# Patient Record
Sex: Male | Born: 1935 | Race: White | Hispanic: No | Smoking: Former smoker
Health system: Southern US, Community
[De-identification: ages and names within clinical notes are randomized; demographics above are authoritative.]

## PROBLEM LIST (undated history)

## (undated) DIAGNOSIS — E039 Hypothyroidism, unspecified: Secondary | ICD-10-CM

## (undated) DIAGNOSIS — C4491 Basal cell carcinoma of skin, unspecified: Secondary | ICD-10-CM

## (undated) DIAGNOSIS — D649 Anemia, unspecified: Secondary | ICD-10-CM

## (undated) DIAGNOSIS — M1990 Unspecified osteoarthritis, unspecified site: Secondary | ICD-10-CM

## (undated) DIAGNOSIS — F32A Depression, unspecified: Secondary | ICD-10-CM

## (undated) DIAGNOSIS — G8929 Other chronic pain: Secondary | ICD-10-CM

## (undated) DIAGNOSIS — S41119A Laceration without foreign body of unspecified upper arm, initial encounter: Secondary | ICD-10-CM

## (undated) DIAGNOSIS — N390 Urinary tract infection, site not specified: Secondary | ICD-10-CM

## (undated) DIAGNOSIS — Z960 Presence of urogenital implants: Secondary | ICD-10-CM

## (undated) DIAGNOSIS — Z9289 Personal history of other medical treatment: Secondary | ICD-10-CM

## (undated) DIAGNOSIS — Z978 Presence of other specified devices: Secondary | ICD-10-CM

## (undated) DIAGNOSIS — C61 Malignant neoplasm of prostate: Secondary | ICD-10-CM

## (undated) DIAGNOSIS — J189 Pneumonia, unspecified organism: Secondary | ICD-10-CM

## (undated) DIAGNOSIS — F329 Major depressive disorder, single episode, unspecified: Secondary | ICD-10-CM

## (undated) DIAGNOSIS — H9190 Unspecified hearing loss, unspecified ear: Secondary | ICD-10-CM

## (undated) DIAGNOSIS — M549 Dorsalgia, unspecified: Secondary | ICD-10-CM

---
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
## 2010-04-05 ENCOUNTER — Encounter
Admission: RE | Admit: 2010-04-05 | Discharge: 2010-04-05 | Payer: Self-pay | Source: Home / Self Care | Attending: Family Medicine | Admitting: Family Medicine

## 2010-04-13 ENCOUNTER — Encounter
Admission: RE | Admit: 2010-04-13 | Discharge: 2010-04-13 | Payer: Self-pay | Source: Home / Self Care | Attending: Family Medicine | Admitting: Family Medicine

## 2010-04-14 ENCOUNTER — Encounter
Admission: RE | Admit: 2010-04-14 | Discharge: 2010-04-14 | Payer: Self-pay | Source: Home / Self Care | Attending: Family Medicine | Admitting: Family Medicine

## 2010-05-04 ENCOUNTER — Encounter
Admission: RE | Admit: 2010-05-04 | Discharge: 2010-05-04 | Payer: Self-pay | Source: Home / Self Care | Attending: Orthopedic Surgery | Admitting: Orthopedic Surgery

## 2012-08-10 ENCOUNTER — Ambulatory Visit (INDEPENDENT_AMBULATORY_CARE_PROVIDER_SITE_OTHER): Payer: Medicare Other | Admitting: Podiatry

## 2012-08-10 VITALS — Ht 63.0 in | Wt 135.0 lb

## 2012-08-10 DIAGNOSIS — M25579 Pain in unspecified ankle and joints of unspecified foot: Secondary | ICD-10-CM

## 2012-08-10 DIAGNOSIS — B351 Tinea unguium: Secondary | ICD-10-CM

## 2012-08-10 NOTE — Progress Notes (Signed)
Subjective: 79 y.o. year old male patient presents complaining of painful nails. Patient requests toe nails, corns and calluses trimmed.   Review of Systems - General ROS: negative for - chills, fatigue, fever, night sweats, sleep disturbance, weight gain or weight loss  Objective: Dermatologic: Thick yellow deformed nails x 10.  Dry xerotic skin bilateral. Vascular: Pedal pulses are faintly palpable. Positive of trophic change. Orthopedic: Contracted lesser digits 1-5 with bunion deformity bilateral. Neurologic: All epicritic and tactile sensations grossly intact.  Assessment: Dystrophic mycotic nails x 10.  Treatment: All mycotic nails debrided.  Return in 3 months or as needed.

## 2012-11-09 ENCOUNTER — Ambulatory Visit (INDEPENDENT_AMBULATORY_CARE_PROVIDER_SITE_OTHER): Payer: Medicare Other | Admitting: Podiatry

## 2012-11-09 ENCOUNTER — Encounter: Payer: Self-pay | Admitting: Podiatry

## 2012-11-09 VITALS — Ht 63.0 in | Wt 135.0 lb

## 2012-11-09 DIAGNOSIS — M25579 Pain in unspecified ankle and joints of unspecified foot: Secondary | ICD-10-CM | POA: Insufficient documentation

## 2012-11-09 DIAGNOSIS — B351 Tinea unguium: Secondary | ICD-10-CM | POA: Insufficient documentation

## 2012-11-09 NOTE — Progress Notes (Signed)
Subjective: 79 y.o. year old male patient presents complaining of painful nails on both great toes and requests trimming.   Objective: Dermatologic: Thick yellow deformed nails x 10.  Vascular: Pedal pulses are all palpable. Orthopedic: Contracted lesser digits bilatereal. Neurologic: All epicritic and tactile sensations grossly intact.  Assessment: Dystrophic mycotic nails x 10. Painful great toes.  Treatment: All mycotic nails, corns, calluses debrided.  Return in 3 months or as needed.

## 2013-02-04 ENCOUNTER — Emergency Department (HOSPITAL_COMMUNITY): Payer: Medicare Other | Admitting: Anesthesiology

## 2013-02-04 ENCOUNTER — Observation Stay (HOSPITAL_COMMUNITY)
Admission: EM | Admit: 2013-02-04 | Discharge: 2013-02-05 | Disposition: A | Discharge status: Home / Self Care | Payer: Medicare Other | Attending: Orthopedic Surgery | Admitting: Orthopedic Surgery

## 2013-02-04 ENCOUNTER — Emergency Department (HOSPITAL_COMMUNITY): Payer: Medicare Other

## 2013-02-04 ENCOUNTER — Encounter (HOSPITAL_COMMUNITY): Payer: Medicare Other | Admitting: Anesthesiology

## 2013-02-04 ENCOUNTER — Encounter (HOSPITAL_COMMUNITY): Payer: Self-pay | Admitting: Emergency Medicine

## 2013-02-04 ENCOUNTER — Encounter (HOSPITAL_COMMUNITY): Admission: EM | Disposition: A | Discharge status: Home / Self Care | Payer: Self-pay | Source: Home / Self Care

## 2013-02-04 DIAGNOSIS — S41109A Unspecified open wound of unspecified upper arm, initial encounter: Principal | ICD-10-CM | POA: Insufficient documentation

## 2013-02-04 DIAGNOSIS — Y92009 Unspecified place in unspecified non-institutional (private) residence as the place of occurrence of the external cause: Secondary | ICD-10-CM | POA: Insufficient documentation

## 2013-02-04 DIAGNOSIS — S41119A Laceration without foreign body of unspecified upper arm, initial encounter: Secondary | ICD-10-CM

## 2013-02-04 DIAGNOSIS — S41112A Laceration without foreign body of left upper arm, initial encounter: Secondary | ICD-10-CM

## 2013-02-04 DIAGNOSIS — S53499A Other sprain of unspecified elbow, initial encounter: Secondary | ICD-10-CM | POA: Insufficient documentation

## 2013-02-04 DIAGNOSIS — R609 Edema, unspecified: Secondary | ICD-10-CM | POA: Insufficient documentation

## 2013-02-04 DIAGNOSIS — W010XXA Fall on same level from slipping, tripping and stumbling without subsequent striking against object, initial encounter: Secondary | ICD-10-CM | POA: Insufficient documentation

## 2013-02-04 DIAGNOSIS — Z79899 Other long term (current) drug therapy: Secondary | ICD-10-CM | POA: Insufficient documentation

## 2013-02-04 DIAGNOSIS — S51809A Unspecified open wound of unspecified forearm, initial encounter: Secondary | ICD-10-CM | POA: Insufficient documentation

## 2013-02-04 DIAGNOSIS — Y998 Other external cause status: Secondary | ICD-10-CM | POA: Insufficient documentation

## 2013-02-04 DIAGNOSIS — S51009A Unspecified open wound of unspecified elbow, initial encounter: Secondary | ICD-10-CM | POA: Insufficient documentation

## 2013-02-04 HISTORY — DX: Basal cell carcinoma of skin, unspecified: C44.91

## 2013-02-04 HISTORY — PX: I & D EXTREMITY: SHX5045

## 2013-02-04 HISTORY — DX: Laceration without foreign body of unspecified upper arm, initial encounter: S41.119A

## 2013-02-04 HISTORY — PX: INCISION AND DRAINAGE: SHX5863

## 2013-02-04 LAB — CBC WITH DIFFERENTIAL/PLATELET
Basophils Absolute: 0 10*3/uL (ref 0.0–0.1)
Basophils Relative: 0 % (ref 0–1)
HCT: 37.4 % — ABNORMAL LOW (ref 39.0–52.0)
Hemoglobin: 12.2 g/dL — ABNORMAL LOW (ref 13.0–17.0)
Lymphocytes Relative: 15 % (ref 12–46)
MCHC: 32.6 g/dL (ref 30.0–36.0)
Monocytes Absolute: 0.4 10*3/uL (ref 0.1–1.0)
Neutro Abs: 7 10*3/uL (ref 1.7–7.7)
Platelets: 205 10*3/uL (ref 150–400)

## 2013-02-04 LAB — URINALYSIS, ROUTINE W REFLEX MICROSCOPIC
Bilirubin Urine: NEGATIVE
Glucose, UA: NEGATIVE mg/dL
Leukocytes, UA: NEGATIVE
Nitrite: NEGATIVE
Specific Gravity, Urine: 1.019 (ref 1.005–1.030)
pH: 5.5 (ref 5.0–8.0)

## 2013-02-04 LAB — BASIC METABOLIC PANEL
BUN: 29 mg/dL — ABNORMAL HIGH (ref 6–23)
Calcium: 9.2 mg/dL (ref 8.4–10.5)
Chloride: 102 mEq/L (ref 96–112)
Creatinine, Ser: 1.08 mg/dL (ref 0.50–1.35)
GFR calc non Af Amer: 57 mL/min — ABNORMAL LOW (ref >90)
Glucose, Bld: 177 mg/dL — ABNORMAL HIGH (ref 70–99)
Sodium: 139 mEq/L (ref 135–145)

## 2013-02-04 LAB — PROTIME-INR
INR: 0.93 (ref 0.00–1.49)
Prothrombin Time: 12.3 seconds (ref 11.6–15.2)

## 2013-02-04 LAB — SAMPLE TO BLOOD BANK

## 2013-02-04 SURGERY — IRRIGATION AND DEBRIDEMENT EXTREMITY
Anesthesia: General | Site: Arm Lower | Laterality: Left | Wound class: Dirty or Infected

## 2013-02-04 MED ORDER — BACITRACIN ZINC 500 UNIT/GM EX OINT
TOPICAL_OINTMENT | CUTANEOUS | Status: AC
  Filled 2013-02-04: qty 15

## 2013-02-04 MED ORDER — SUCCINYLCHOLINE CHLORIDE 20 MG/ML IJ SOLN
INTRAMUSCULAR | Status: DC | PRN
  Administered 2013-02-04: 100 mg via INTRAVENOUS

## 2013-02-04 MED ORDER — LACTATED RINGERS IV SOLN
INTRAVENOUS | Status: DC | PRN
  Administered 2013-02-04: 15:00:00 via INTRAVENOUS

## 2013-02-04 MED ORDER — HYDROCODONE-ACETAMINOPHEN 5-325 MG PO TABS: 1.0000 | ORAL_TABLET | ORAL | Status: DC | PRN

## 2013-02-04 MED ORDER — LIDOCAINE HCL (CARDIAC) 20 MG/ML IV SOLN
INTRAVENOUS | Status: DC | PRN
  Administered 2013-02-04: 100 mg via INTRAVENOUS

## 2013-02-04 MED ORDER — PHENYLEPHRINE HCL 10 MG/ML IJ SOLN
10.0000 mg | INTRAVENOUS | Status: DC | PRN
  Administered 2013-02-04: 5 ug/min via INTRAVENOUS

## 2013-02-04 MED ORDER — ONDANSETRON HCL 4 MG/2ML IJ SOLN: 4.0000 mg | Freq: Once | INTRAMUSCULAR | Status: DC | PRN

## 2013-02-04 MED ORDER — ARTIFICIAL TEARS OP OINT
TOPICAL_OINTMENT | OPHTHALMIC | Status: DC | PRN
  Administered 2013-02-04: 1 via OPHTHALMIC

## 2013-02-04 MED ORDER — MORPHINE SULFATE 2 MG/ML IJ SOLN: 1.0000 mg | INTRAMUSCULAR | Status: DC | PRN

## 2013-02-04 MED ORDER — SENNA 8.6 MG PO TABS
1.0000 | ORAL_TABLET | Freq: Two times a day (BID) | ORAL | Status: DC
  Administered 2013-02-04 – 2013-02-05 (×2): 8.6 mg via ORAL
  Filled 2013-02-04 (×3): qty 1

## 2013-02-04 MED ORDER — CEFAZOLIN SODIUM-DEXTROSE 2-3 GM-% IV SOLR
2.0000 g | Freq: Once | INTRAVENOUS | Status: AC
  Administered 2013-02-04: 2 g via INTRAVENOUS

## 2013-02-04 MED ORDER — ONDANSETRON HCL 4 MG/2ML IJ SOLN: 4.0000 mg | Freq: Four times a day (QID) | INTRAMUSCULAR | Status: DC | PRN

## 2013-02-04 MED ORDER — CEFAZOLIN SODIUM 1-5 GM-% IV SOLN
1.0000 g | Freq: Four times a day (QID) | INTRAVENOUS | Status: AC
  Administered 2013-02-04 – 2013-02-05 (×3): 1 g via INTRAVENOUS
  Filled 2013-02-04 (×4): qty 50

## 2013-02-04 MED ORDER — PHENYLEPHRINE HCL 10 MG/ML IJ SOLN
INTRAMUSCULAR | Status: DC | PRN
  Administered 2013-02-04: 80 ug via INTRAVENOUS
  Administered 2013-02-04 (×3): 40 ug via INTRAVENOUS
  Administered 2013-02-04: 80 ug via INTRAVENOUS

## 2013-02-04 MED ORDER — OXYCODONE HCL 5 MG PO TABS: 5.0000 mg | ORAL_TABLET | Freq: Once | ORAL | Status: DC | PRN

## 2013-02-04 MED ORDER — TETANUS-DIPHTH-ACELL PERTUSSIS 5-2.5-18.5 LF-MCG/0.5 IM SUSP
0.5000 mL | Freq: Once | INTRAMUSCULAR | Status: AC
  Administered 2013-02-04: 0.5 mL via INTRAMUSCULAR
  Filled 2013-02-04: qty 0.5

## 2013-02-04 MED ORDER — OXYCODONE HCL 5 MG/5ML PO SOLN: 5.0000 mg | Freq: Once | ORAL | Status: DC | PRN

## 2013-02-04 MED ORDER — DOCUSATE SODIUM 100 MG PO CAPS
100.0000 mg | ORAL_CAPSULE | Freq: Two times a day (BID) | ORAL | Status: DC
  Administered 2013-02-04 – 2013-02-05 (×2): 100 mg via ORAL
  Filled 2013-02-04 (×3): qty 1

## 2013-02-04 MED ORDER — HYDROMORPHONE HCL PF 1 MG/ML IJ SOLN
0.2500 mg | INTRAMUSCULAR | Status: DC | PRN
Start: 2013-02-04 — End: 2013-02-04

## 2013-02-04 MED ORDER — ONDANSETRON HCL 4 MG PO TABS
4.0000 mg | ORAL_TABLET | Freq: Four times a day (QID) | ORAL | Status: DC | PRN
  Administered 2013-02-05: 4 mg via ORAL
  Filled 2013-02-04: qty 1

## 2013-02-04 MED ORDER — ONDANSETRON HCL 4 MG/2ML IJ SOLN
INTRAMUSCULAR | Status: DC | PRN
  Administered 2013-02-04: 4 mg via INTRAVENOUS

## 2013-02-04 MED ORDER — BACITRACIN ZINC 500 UNIT/GM EX OINT
TOPICAL_OINTMENT | CUTANEOUS | Status: DC | PRN
  Administered 2013-02-04: 1 via TOPICAL

## 2013-02-04 MED ORDER — SODIUM CHLORIDE 0.9 % IV SOLN
INTRAVENOUS | Status: DC
  Administered 2013-02-04 (×2): via INTRAVENOUS

## 2013-02-04 MED ORDER — FENTANYL CITRATE 0.05 MG/ML IJ SOLN
INTRAMUSCULAR | Status: DC | PRN
  Administered 2013-02-04 (×4): 50 ug via INTRAVENOUS

## 2013-02-04 MED ORDER — LACTATED RINGERS IV SOLN
Freq: Once | INTRAVENOUS | Status: AC
  Administered 2013-02-04: 15:00:00 via INTRAVENOUS

## 2013-02-04 MED ORDER — PROPOFOL 10 MG/ML IV BOLUS
INTRAVENOUS | Status: DC | PRN
  Administered 2013-02-04: 80 mg via INTRAVENOUS

## 2013-02-04 MED ORDER — SODIUM CHLORIDE 0.9 % IR SOLN
Status: DC | PRN
  Administered 2013-02-04: 3000 mL

## 2013-02-04 MED ORDER — MEPERIDINE HCL 25 MG/ML IJ SOLN: 6.2500 mg | INTRAMUSCULAR | Status: DC | PRN

## 2013-02-04 SURGICAL SUPPLY — 58 items
BANDAGE CONFORM 3  STR LF (GAUZE/BANDAGES/DRESSINGS) IMPLANT
BANDAGE ELASTIC 4 VELCRO ST LF (GAUZE/BANDAGES/DRESSINGS) ×4 IMPLANT
BANDAGE ESMARK 6X9 LF (GAUZE/BANDAGES/DRESSINGS) IMPLANT
BLADE SURG 10 STRL SS (BLADE) ×2 IMPLANT
BNDG COHESIVE 4X5 TAN STRL (GAUZE/BANDAGES/DRESSINGS) IMPLANT
BNDG COHESIVE 6X5 TAN STRL LF (GAUZE/BANDAGES/DRESSINGS) IMPLANT
BNDG ESMARK 6X9 LF (GAUZE/BANDAGES/DRESSINGS)
CHLORAPREP W/TINT 26ML (MISCELLANEOUS) IMPLANT
CLOTH BEACON ORANGE TIMEOUT ST (SAFETY) IMPLANT
COVER SURGICAL LIGHT HANDLE (MISCELLANEOUS) ×2 IMPLANT
CUFF TOURNIQUET SINGLE 18IN (TOURNIQUET CUFF) ×2 IMPLANT
CUFF TOURNIQUET SINGLE 34IN LL (TOURNIQUET CUFF) IMPLANT
CUFF TOURNIQUET SINGLE 44IN (TOURNIQUET CUFF) IMPLANT
DRAIN PENROSE 1/2X12 LTX STRL (WOUND CARE) IMPLANT
DRAPE U-SHAPE 47X51 STRL (DRAPES) ×2 IMPLANT
DRSG ADAPTIC 3X8 NADH LF (GAUZE/BANDAGES/DRESSINGS) ×4 IMPLANT
DRSG PAD ABDOMINAL 8X10 ST (GAUZE/BANDAGES/DRESSINGS) ×4 IMPLANT
ELECT REM PT RETURN 9FT ADLT (ELECTROSURGICAL) ×2
ELECTRODE REM PT RTRN 9FT ADLT (ELECTROSURGICAL) ×1 IMPLANT
FLUID NSS /IRRIG 3000 ML XXX (IV SOLUTION) ×2 IMPLANT
GLOVE BIO SURGEON STRL SZ8 (GLOVE) ×2 IMPLANT
GLOVE BIOGEL PI IND STRL 6.5 (GLOVE) ×1 IMPLANT
GLOVE BIOGEL PI IND STRL 8 (GLOVE) ×1 IMPLANT
GLOVE BIOGEL PI INDICATOR 6.5 (GLOVE) ×1
GLOVE BIOGEL PI INDICATOR 8 (GLOVE) ×1
GLOVE SURG SS PI 6.5 STRL IVOR (GLOVE) ×4 IMPLANT
GOWN PREVENTION PLUS XLARGE (GOWN DISPOSABLE) ×2 IMPLANT
GOWN STRL NON-REIN LRG LVL3 (GOWN DISPOSABLE) ×4 IMPLANT
KIT BASIN OR (CUSTOM PROCEDURE TRAY) ×2 IMPLANT
KIT ROOM TURNOVER OR (KITS) ×2 IMPLANT
MANIFOLD NEPTUNE II (INSTRUMENTS) ×2 IMPLANT
NS IRRIG 1000ML POUR BTL (IV SOLUTION) IMPLANT
PACK ORTHO EXTREMITY (CUSTOM PROCEDURE TRAY) ×2 IMPLANT
PAD ARMBOARD 7.5X6 YLW CONV (MISCELLANEOUS) ×4 IMPLANT
PAD CAST 4YDX4 CTTN HI CHSV (CAST SUPPLIES) ×2 IMPLANT
PADDING CAST ABS 4INX4YD NS (CAST SUPPLIES) ×1
PADDING CAST ABS COTTON 4X4 ST (CAST SUPPLIES) ×1 IMPLANT
PADDING CAST COTTON 4X4 STRL (CAST SUPPLIES) ×2
SCRUB BETADINE 4OZ XXX (MISCELLANEOUS) ×2 IMPLANT
SOAP 2 % CHG 4 OZ (WOUND CARE) IMPLANT
SOLUTION BETADINE 4OZ (MISCELLANEOUS) ×2 IMPLANT
SPLINT PLASTER CAST XFAST 5X30 (CAST SUPPLIES) ×1 IMPLANT
SPLINT PLASTER XFAST SET 5X30 (CAST SUPPLIES) ×1
SPONGE GAUZE 4X4 12PLY (GAUZE/BANDAGES/DRESSINGS) ×2 IMPLANT
STAPLER VISISTAT 35W (STAPLE) IMPLANT
SUCTION FRAZIER TIP 10 FR DISP (SUCTIONS) ×2 IMPLANT
SUT ETHILON 3 0 FSL (SUTURE) ×4 IMPLANT
SUT PROLENE 3 0 PS 2 (SUTURE) ×2 IMPLANT
SUT VIC AB 2-0 CT1 27 (SUTURE) ×1
SUT VIC AB 2-0 CT1 TAPERPNT 27 (SUTURE) ×1 IMPLANT
SUT VIC AB 3-0 PS2 18 (SUTURE)
SUT VIC AB 3-0 PS2 18XBRD (SUTURE) IMPLANT
TOWEL OR 17X24 6PK STRL BLUE (TOWEL DISPOSABLE) ×2 IMPLANT
TOWEL OR 17X26 10 PK STRL BLUE (TOWEL DISPOSABLE) ×2 IMPLANT
TUBE CONNECTING 12X1/4 (SUCTIONS) ×2 IMPLANT
TUBING CYSTO DISP (UROLOGICAL SUPPLIES) ×2 IMPLANT
WATER STERILE IRR 1000ML POUR (IV SOLUTION) IMPLANT
YANKAUER SUCT BULB TIP NO VENT (SUCTIONS) ×2 IMPLANT

## 2013-02-04 NOTE — ED Provider Notes (Signed)
CSN: [id number]     Arrival date & time 02/04/13  1044 History   First MD Initiated Contact with Patient 02/04/13 1047     Chief Complaint  Patient presents with  . Extremity Laceration   (Consider location/radiation/quality/duration/timing/severity/associated sxs/prior Treatment) HPI Comments: Anthony Nicholson is a 79 y.o. male who was walking to get the newspaper, when he fell, injuring his left arm. He was able to return him to his home and dressed the injury. He denies other injury. He came here by private vehicle for evaluation. There are no other known modifying factors.   The history is provided by the patient.    Past Medical History  Diagnosis Date  . Basal cell carcinoma   . Laceration of arm 02/04/2013   Past Surgical History  Procedure Laterality Date  . Incision and drainage Left 02/04/2013    left arm laceration    Dr Victorino Dike  .  sharpnel removal    1943     History reviewed. No pertinent family history. History  Substance Use Topics  . Smoking status: Never Smoker   . Smokeless tobacco: Never Used  . Alcohol Use: No    Review of Systems  All other systems reviewed and are negative.    Allergies  Review of patient's allergies indicates no known allergies.  Home Medications   No current outpatient prescriptions on file. BP 123/47  Pulse 78  Temp(Src) 98.2 F (36.8 C) (Oral)  Resp 18  Ht 5\' 8"  (1.727 m)  Wt 135 lb (61.236 kg)  BMI 20.53 kg/m2  SpO2 97% Physical Exam  Nursing note and vitals reviewed. Constitutional: He is oriented to person, place, and time. He appears well-developed.  Elderly, frail  HENT:  Head: Normocephalic and atraumatic.  Right Ear: External ear normal.  Left Ear: External ear normal.  Eyes: Conjunctivae and EOM are normal. Pupils are equal, round, and reactive to light.  Neck: Normal range of motion and phonation normal. Neck supple.  Cardiovascular: Normal rate, regular rhythm, normal heart sounds and intact distal  pulses.   Pulmonary/Chest: Effort normal and breath sounds normal. He exhibits no bony tenderness.  Abdominal: Soft. Normal appearance. There is no tenderness.  Musculoskeletal: Normal range of motion.  Left arm with large laceration extending from the medial upper arm into the proximal forearm. Within this wound, are visible deep tissue elements, including a large lacerated muscle bundle, likely biceps, tendons, nerves, and the vessels. Surprisingly, there is very little bleeding from the wound.  Neurological: He is alert and oriented to person, place, and time. No cranial nerve deficit or sensory deficit. He exhibits normal muscle tone. Coordination normal.  Skin: Skin is warm, dry and intact.  Psychiatric: He has a normal mood and affect. His behavior is normal. Judgment and thought content normal.    ED Course  Procedures (including critical care time)  Medications  0.9 %  sodium chloride infusion ( Intravenous New Bag/Given 02/04/13 1841)  ondansetron (ZOFRAN) tablet 4 mg (4 mg Oral Given 02/05/13 1439)    Or  ondansetron (ZOFRAN) injection 4 mg ( Intravenous See Alternative 02/05/13 1439)  HYDROcodone-acetaminophen (NORCO/VICODIN) 5-325 MG per tablet 1 tablet (not administered)  morphine 2 MG/ML injection 1 mg (not administered)  senna (SENOKOT) tablet 8.6 mg (8.6 mg Oral Given 02/05/13 1006)  docusate sodium (COLACE) capsule 100 mg (100 mg Oral Given 02/05/13 1005)  TDaP (BOOSTRIX) injection 0.5 mL (0.5 mLs Intramuscular Given 02/04/13 1134)  lactated ringers infusion ( Intravenous New Bag/Given 02/04/13 1517)  ceFAZolin (ANCEF) IVPB 2 g/50 mL premix (2 g Intravenous Given 02/04/13 1535)  ceFAZolin (ANCEF) IVPB 1 g/50 mL premix (1 g Intravenous Given 02/05/13 1006)    Patient Vitals for the past 24 hrs:  BP Temp Temp src Pulse Resp SpO2  02/05/13 1348 123/47 mmHg 98.2 F (36.8 C) Oral 78 18 97 %  02/05/13 0501 110/51 mmHg 98.5 F (36.9 C) Oral 72 18 98 %  02/05/13 0200 124/50  mmHg 97.4 F (36.3 C) Oral 82 18 98 %  02/04/13 2019 123/95 mmHg 98.1 F (36.7 C) Oral 75 18 99 %  02/04/13 1815 154/54 mmHg 97.6 F (36.4 C) - 73 18 100 %  02/04/13 1750 - 97.6 F (36.4 C) - - - -  02/04/13 1741 147/79 mmHg - - 86 18 100 %  02/04/13 1726 152/73 mmHg - - 75 16 98 %  02/04/13 1711 140/64 mmHg - - 72 13 97 %  02/04/13 1656 139/61 mmHg - - 73 14 96 %  02/04/13 1641 120/60 mmHg - - 71 15 100 %  02/04/13 1638 - 97.6 F (36.4 C) - - - -   1:05 PM-Consult complete with Dr. Victorino Dike. Patient case explained and discussed. Hegrees to admit patient for further evaluation and treatment. Call ended at 1330    Labs Review Labs Reviewed  CBC WITH DIFFERENTIAL - Abnormal; Notable for the following:    RBC 4.09 (*)    Hemoglobin 12.2 (*)    HCT 37.4 (*)    Neutrophils Relative % 78 (*)    All other components within normal limits  BASIC METABOLIC PANEL - Abnormal; Notable for the following:    Glucose, Bld 177 (*)    BUN 29 (*)    GFR calc non Af Amer 57 (*)    GFR calc Af Amer 66 (*)    All other components within normal limits  URINE CULTURE  URINALYSIS, ROUTINE W REFLEX MICROSCOPIC  PROTIME-INR  SAMPLE TO BLOOD BANK   Imaging Review Dg Forearm Left  02/04/2013   CLINICAL DATA:  Recent traumatic injury  EXAM: LEFT FOREARM - 2 VIEW  COMPARISON:  None  FINDINGS: No definitive acute bony abnormality is seen. Considerable soft tissue swelling and air within the soft tissues is noted related to the recent injury.  IMPRESSION: Soft tissue abnormality consistent with a recent injury. No acute bony fracture is seen.   Electronically Signed   By: Alcide Clever M.D.   On: 02/04/2013 12:17   Dg Humerus Left  02/04/2013   CLINICAL DATA:  Fall.  EXAM: LEFT HUMERUS - 2+ VIEW  COMPARISON:  Forearm films same date.  FINDINGS: Significant soft tissue injury lower humerus/forearm level. No humeral fracture.  IMPRESSION: Significant soft tissue injury lower humerus/forearm level. No humeral  fracture.   Electronically Signed   By: Bridgett Larsson M.D.   On: 02/04/2013 12:38    EKG Interpretation     Ventricular Rate:  77 PR Interval:  166 QRS Duration: 86 QT Interval:  389 QTC Calculation: 441 R Axis:   75 Text Interpretation:  Sinus arrhythmia Ventricular premature complex No old tracing to compare                MDM   1. Laceration of left arm with complication, initial encounter    Complex left arm laceration with significant muscle injury. The wound is likely contaminated. We will need to be assessed and treated by an orthopedist. This will likely require operative repair in  an operating room.  Nursing Notes Reviewed/ Care Coordinated, and agree without changes. Applicable Imaging Reviewed.  Interpretation of Laboratory Data incorporated into ED treatment  Plan: Admit    Flint Melter, MD 02/05/13 [phone]

## 2013-02-04 NOTE — H&P (Signed)
Anthony Nicholson is an 79 y.o. male.   Chief Complaint: right arm laceration HPI: 79 y/o male was in his usual state of good health until this morning at 10:30 when he fell trying to get out of the door to get the paper.  He says his arm got caught on the door and bled profusely.  He denies numbness, tingling or weakness in the left upper extremity.  He c/o some mild pain medially with attempted motion.  No h/o injury or surgery to the left upper extremity in the past.  He denies any recent change in his health.  He has no h/o heart attack, stroke or diabetes.  He takes no blood thinners.  He lives with his wife at home.  PMH:  Basal cell carcinoma, HOH, low back pain  PSH:  Carcinoma excisions, L spine steroid injections by Dr. Ethelene Hal  FH:  Father died at 45 and had a h/o stroke.  Mom died at 32 of "old age".  Social History:  reports that he has never smoked. He has never used smokeless tobacco. He reports that he does not drink alcohol or use illicit drugs.  Allergies: No Known Allergies  Results for orders placed during the hospital encounter of 02/04/13 (from the past 48 hour(s))  CBC WITH DIFFERENTIAL     Status: Abnormal   Collection Time    02/04/13 11:08 AM      Result Value Range   WBC 9.0  4.0 - 10.5 K/uL   RBC 4.09 (*) 4.22 - 5.81 MIL/uL   Hemoglobin 12.2 (*) 13.0 - 17.0 g/dL   HCT 09.8 (*) 11.9 - 14.7 %   MCV 91.4  78.0 - 100.0 fL   MCH 29.8  26.0 - 34.0 pg   MCHC 32.6  30.0 - 36.0 g/dL   RDW 82.9  56.2 - 13.0 %   Platelets 205  150 - 400 K/uL   Neutrophils Relative % 78 (*) 43 - 77 %   Neutro Abs 7.0  1.7 - 7.7 K/uL   Lymphocytes Relative 15  12 - 46 %   Lymphs Abs 1.3  0.7 - 4.0 K/uL   Monocytes Relative 5  3 - 12 %   Monocytes Absolute 0.4  0.1 - 1.0 K/uL   Eosinophils Relative 2  0 - 5 %   Eosinophils Absolute 0.2  0.0 - 0.7 K/uL   Basophils Relative 0  0 - 1 %   Basophils Absolute 0.0  0.0 - 0.1 K/uL  BASIC METABOLIC PANEL     Status: Abnormal   Collection Time     02/04/13 11:08 AM      Result Value Range   Sodium 139  135 - 145 mEq/L   Potassium 3.8  3.5 - 5.1 mEq/L   Chloride 102  96 - 112 mEq/L   CO2 26  19 - 32 mEq/L   Glucose, Bld 177 (*) 70 - 99 mg/dL   BUN 29 (*) 6 - 23 mg/dL   Creatinine, Ser 8.65  0.50 - 1.35 mg/dL   Calcium 9.2  8.4 - 78.4 mg/dL   GFR calc non Af Amer 57 (*) >90 mL/min   GFR calc Af Amer 66 (*) >90 mL/min   Comment: (NOTE)     The eGFR has been calculated using the CKD EPI equation.     This calculation has not been validated in all clinical situations.     eGFR's persistently <90 mL/min signify possible Chronic Kidney  Disease.  PROTIME-INR     Status: None   Collection Time    02/04/13 11:08 AM      Result Value Range   Prothrombin Time 12.3  11.6 - 15.2 seconds   INR 0.93  0.00 - 1.49  SAMPLE TO BLOOD BANK     Status: None   Collection Time    02/04/13 11:08 AM      Result Value Range   Blood Bank Specimen SAMPLE AVAILABLE FOR TESTING     Sample Expiration 02/05/2013    URINALYSIS, ROUTINE W REFLEX MICROSCOPIC     Status: None   Collection Time    02/04/13  2:04 PM      Result Value Range   Color, Urine YELLOW  YELLOW   APPearance CLEAR  CLEAR   Specific Gravity, Urine 1.019  1.005 - 1.030   pH 5.5  5.0 - 8.0   Glucose, UA NEGATIVE  NEGATIVE mg/dL   Hgb urine dipstick NEGATIVE  NEGATIVE   Bilirubin Urine NEGATIVE  NEGATIVE   Ketones, ur NEGATIVE  NEGATIVE mg/dL   Protein, ur NEGATIVE  NEGATIVE mg/dL   Urobilinogen, UA 0.2  0.0 - 1.0 mg/dL   Nitrite NEGATIVE  NEGATIVE   Leukocytes, UA NEGATIVE  NEGATIVE   Comment: MICROSCOPIC NOT DONE ON URINES WITH NEGATIVE PROTEIN, BLOOD, LEUKOCYTES, NITRITE, OR GLUCOSE <1000 mg/dL.   Dg Forearm Left  02/04/2013   CLINICAL DATA:  Recent traumatic injury  EXAM: LEFT FOREARM - 2 VIEW  COMPARISON:  None  FINDINGS: No definitive acute bony abnormality is seen. Considerable soft tissue swelling and air within the soft tissues is noted related to the recent  injury.  IMPRESSION: Soft tissue abnormality consistent with a recent injury. No acute bony fracture is seen.   Electronically Signed   By: Alcide Clever M.D.   On: 02/04/2013 12:17   Dg Humerus Left  02/04/2013   CLINICAL DATA:  Fall.  EXAM: LEFT HUMERUS - 2+ VIEW  COMPARISON:  Forearm films same date.  FINDINGS: Significant soft tissue injury lower humerus/forearm level. No humeral fracture.  IMPRESSION: Significant soft tissue injury lower humerus/forearm level. No humeral fracture.   Electronically Signed   By: Bridgett Larsson M.D.   On: 02/04/2013 12:38    ROS as above.  Blood pressure 143/84, pulse 93, temperature 97.5 F (36.4 C), temperature source Oral, resp. rate 18, height 5\' 8"  (1.727 m), weight 61.236 kg (135 lb), SpO2 100.00%. Physical Exam  wn wd elderly male in nad.  A and O x 4.  Mood and affect normal. EOMI.  Resp unlabored.  L arm with laceration approx 30 cm from anteromedial across the medial elbow to the medial forearm.  FCU muscle bell protruding from the wound and dusky looking.  Median nerve and brachial artery visible in the wound but intact.  No contamination.  Sens to LT intact in the median nerve dist.  Strength is 5/5 in the FDL and FDP to the index and long fingers.  5/5 at the FPL.  2+ radial and 1+ ulnar pulses.  No lymphadenopathy.  Somewhat weak in palmar flexion at the wrist and with ulnar deviation.  Assessment/Plan Left arm, elbow and forearm laceration - to OR for I and D and repair.  The risks and benefits of the alternative treatment options have been discussed in detail.  The patient wishes to proceed with surgery and specifically understands risks of bleeding, infection, nerve damage, blood clots, need for additional surgery, amputation and death.  Anthony Nicholson 02/04/2013, 2:36 PM

## 2013-02-04 NOTE — Transfer of Care (Signed)
Immediate Anesthesia Transfer of Care Note  Patient: Anthony Nicholson  Procedure(s) Performed: Procedure(s): IRRIGATION AND DEBRIDEMENT Left Arm Laceration with Repair as Necessary (Left)  Patient Location: PACU  Anesthesia Type:General  Level of Consciousness: awake, alert  and oriented  Airway & Oxygen Therapy: Patient Spontanous Breathing and Patient connected to nasal cannula oxygen  Post-op Assessment: Report given to PACU RN and Post -op Vital signs reviewed and stable  Post vital signs: Reviewed and stable  Complications: No apparent anesthesia complications

## 2013-02-04 NOTE — Anesthesia Preprocedure Evaluation (Addendum)
Anesthesia Evaluation  Patient identified by MRN, date of birth, ID band Patient awake    Reviewed: Allergy & Precautions, H&P , NPO status , Patient's Chart, lab work & pertinent test results  Airway Mallampati: I TM Distance: >3 FB Neck ROM: Full    Dental  (+) Teeth Intact and Dental Advisory Given   Pulmonary          Cardiovascular     Neuro/Psych    GI/Hepatic   Endo/Other    Renal/GU      Musculoskeletal   Abdominal   Peds  Hematology   Anesthesia Other Findings   Reproductive/Obstetrics                          Anesthesia Physical Anesthesia Plan  ASA: III  Anesthesia Plan: General   Post-op Pain Management:    Induction: Intravenous  Airway Management Planned: Oral ETT  Additional Equipment:   Intra-op Plan:   Post-operative Plan: Extubation in OR  Informed Consent: I have reviewed the patients History and Physical, chart, labs and discussed the procedure including the risks, benefits and alternatives for the proposed anesthesia with the patient or authorized representative who has indicated his/her understanding and acceptance.   Dental advisory given  Plan Discussed with: CRNA  Anesthesia Plan Comments:        Anesthesia Quick Evaluation

## 2013-02-04 NOTE — Brief Op Note (Signed)
02/04/2013  4:47 PM  PATIENT:  Anthony Nicholson  79 y.o. male  PRE-OPERATIVE DIAGNOSIS:  Left Arm Laceration  POST-OPERATIVE DIAGNOSIS: 1.  Left volar arm, elbow and forearm laceration 27 cm long      2.  Avulsion of left flexor carpi radialis      3.  Avulsion of left palmaris longus  Procedure(s): 1.  Irrigation and excisional debridement of volar arm, elbow and forearm laceration including skin, subcut tissue and muscle 2.  Complex closure of left volar arm, elbow and forearm laceration 27 cm long  SURGEON:  Toni Arthurs, MD  ASSISTANT: n/a  ANESTHESIA:   General  EBL:  minimal   TOURNIQUET:  N/a  COMPLICATIONS:  None apparent  DISPOSITION:  Extubated, awake and stable to recovery.  DICTATION ID:  [id number]

## 2013-02-04 NOTE — Anesthesia Procedure Notes (Signed)
Procedure Name: Intubation Date/Time: 02/04/2013 3:32 PM Performed by: Gayla Medicus Pre-anesthesia Checklist: Patient identified, Timeout performed, Emergency Drugs available, Suction available and Patient being monitored Patient Re-evaluated:Patient Re-evaluated prior to inductionOxygen Delivery Method: Circle system utilized Preoxygenation: Pre-oxygenation with 100% oxygen Intubation Type: IV induction, Rapid sequence and Cricoid Pressure applied Laryngoscope Size: Mac and 3 Grade View: Grade I Tube type: Oral Tube size: 7.0 mm Number of attempts: 1 Airway Equipment and Method: Stylet Placement Confirmation: ETT inserted through vocal cords under direct vision,  positive ETCO2 and breath sounds checked- equal and bilateral Secured at: 21 cm Tube secured with: Tape Dental Injury: Teeth and Oropharynx as per pre-operative assessment

## 2013-02-04 NOTE — Preoperative (Signed)
Beta Blockers   Reason not to administer Beta Blockers:Not Applicable 

## 2013-02-04 NOTE — Anesthesia Postprocedure Evaluation (Signed)
Anesthesia Post Note  Patient: Anthony Nicholson  Procedure(s) Performed: Procedure(s) (LRB): IRRIGATION AND DEBRIDEMENT Left Arm Laceration with Repair as Necessary (Left)  Anesthesia type: general  Patient location: PACU  Post pain: Pain level controlled  Post assessment: Patient's Cardiovascular Status Stable  Last Vitals:  Filed Vitals:   02/04/13 1638  BP:   Pulse:   Temp: 36.4 C  Resp:     Post vital signs: Reviewed and stable  Level of consciousness: sedated  Complications: No apparent anesthesia complications

## 2013-02-04 NOTE — ED Notes (Signed)
Called report to OR.

## 2013-02-04 NOTE — ED Notes (Signed)
He has been evaluated by by surgery.

## 2013-02-04 NOTE — ED Notes (Signed)
Pt states he was on his way outside this morning to get the paper and slipped and fell on concrete. Presents with large open laceration to left forearm. Bones visible.

## 2013-02-05 ENCOUNTER — Encounter (HOSPITAL_COMMUNITY): Payer: Self-pay | Admitting: General Practice

## 2013-02-05 LAB — URINE CULTURE
Colony Count: NO GROWTH
Culture: NO GROWTH

## 2013-02-05 NOTE — Op Note (Signed)
Anthony Nicholson, Anthony Nicholson              ACCOUNT NO.:  [id number]  MEDICAL RECORD NO.:  [id number]  LOCATION:  [id number]                        FACILITY:  [facility]  PHYSICIAN:  Toni Arthurs, MD        DATE OF BIRTH:  [birth year]  DATE OF PROCEDURE:  02/04/2013 DATE OF DISCHARGE:                              OPERATIVE REPORT   PREOPERATIVE DIAGNOSIS:  Left arm laceration.  POSTOPERATIVE DIAGNOSES: 1. Left volar arm, elbow, and forearm laceration 27 cm in length. 2. Avulsion of left flexor carpi radialis muscle. 3. Avulsion of left palmaris longus muscle.  PROCEDURE: 1. Irrigation and excisional debridement of left volar arm, elbow, and     forearm laceration including skin, subcutaneous tissue, and muscle. 2. Complex closure of left volar arm, elbow, and forearm laceration 27     cm in length.  SURGEON:  Toni Arthurs, MD  ANESTHESIA:  General.  ESTIMATED BLOOD LOSS:  Minimal.  TOURNIQUET TIME:  0.  COMPLICATIONS:  None apparent.  DISPOSITION:  Extubated, awake, and stable to recovery.  INDICATIONS FOR PROCEDURE:  The patient is a 79 year old, right-hand- dominant male, who was in his usual state of good health until this morning when he fell trying to get out of his door to get the newspaper. He noticed immediate bleeding at the left arm.  He came to the emergency department.  In the ER, he was noted to have a long complicated laceration of the left volar arm, elbow, and forearm with median nerve evident in the depth of the wound.  He presents now for operative treatment of this severe complicated laceration.  He and his daughter understand the risks and benefits, the alternative treatment options and elects surgical treatment.  They specifically understand risks of bleeding, infection, nerve damage, blood clots, need for additional surgery, amputation, and death.  DESCRIPTION OF PROCEDURE:  After preoperative consent was obtained and the correct operative site was identified,  patient was brought to the operating room and placed supine on the operating table.  General anesthesia was induced.  Preoperative antibiotics were administered. Surgical time-out was taken.  The upper extremity was examined.  There was a 27-cm laceration that originated at the anteromedial arm across the anterior aspect of the elbow at the level of the flexion crease and then continued on down to the radial surface of the volar forearm. Evident within the wound was ruptured muscle belly.  These were examined carefully and were noted to be the origin of flexor carpi radialis and the palmaris longus.  These were totally avulsed from their origin. They were necrotic-appearing throughout the bulk of the muscle belly down to the level in musculotendinous junction.  The median nerve was carefully examined, it was noted to be intact as was the brachial artery.  The laceration extended over the medial epicondyle.  The ulnar nerve was present within its cubital tunnel with no evidence of any injury.  The skin was examined and then excisionally debrided circumferentially removing all nonviable tissue.  This was done with scissors.  Circumferential examination and subcutaneous tissue was then performed with excisional debridement with scissors.  The necrotic muscle was all excised sharply with scissors as well.  The  wound was then carefully irrigated with 3 L normal saline.  Again circumferential examination of the wound was carried out from the level of skin down to the level of muscle.  There was no evidence of any bone exposed.  The medial ligamentous structures of the elbow were noted to be intact and the elbow was stable to varus and valgus stress in 0 and 30 degrees of flexion.  The skin incision was noted to have a flap proximally.  This was rotated back into place and secured with simple sutures of 3-0 nylon.  The forearm incision was then closed with retention sutures to relieve the  pressure along the skin edges.  Simple sutures of 3-0 nylon were then used to close the skin incision.  Sterile dressings were applied followed by compression wrap and a posterior splint with the elbow flexed approximately 60 degrees, and new interval on the forearm in neutral rotation.  The patient was then awakened from anesthesia and transported to recovery room in stable condition.  FOLLOWUP PLAN:  The patient will be observed overnight for pain control and for IV antibiotics.  He will also have a Physical Therapy consult for use of a rolling walker with a platform for the left upper extremity.     Toni Arthurs, MD     JH/MEDQ  D:  02/04/2013  T:  02/05/2013  Job:  [id number]

## 2013-02-05 NOTE — Progress Notes (Signed)
Aundria Mems to be D/C'd Home per MD order. Discussed with the patient and all questions fully answered.    Medication List         cholecalciferol 1000 UNITS tablet  Commonly known as:  VITAMIN D  Take 1,000 Units by mouth 2 (two) times daily.        VVS, Skin clean, dry and intact without evidence of skin break down, no evidence of skin tears noted.  IV catheter discontinued intact. Site without signs and symptoms of complications. Dressing and pressure applied.  An After Visit Summary was printed and given to the patient.  Patient escorted via WC, and D/C home via private auto.  Kai Levins  02/05/2013 5:57 PM

## 2013-02-05 NOTE — Progress Notes (Signed)
02/05/13 Spoke with patient about HHC, received permission to call his daughter. Called # listed for patient's daughter Kathi Simpers, she is stepdaughter and asked that I fax list of HHC agenices to her and she would call patient's daughter Yehuda Mao to discuss.Faxed HHC agency list to [place]. Spoke with patient's daughter Yehuda Mao in person and she chose [facility]. Family is planning to increase hours of personal caregivers to have assistance 24/7.Contacted Krishna Dancel at [facility] and set up HHPT and HHOT.  Left platform walker delivered to patient by [facility]. Jacquelynn Cree RN, BSN, [employer]

## 2013-02-05 NOTE — Evaluation (Signed)
Physical Therapy Evaluation Patient Details Name: Anthony Nicholson MRN: [id number] DOB: Dec 08, 1917 Today's Date: 02/05/2013 Time: 0865-7846 PT Time Calculation (min): 25 min  PT Assessment / Plan / Recommendation History of Present Illness  pt presents after fall at home resulting in L UE laceration ~27cm in length.    Clinical Impression  Pt unsteady with mobility and relates multiple falls at home.  Pt is not safe to be ambulating alone.  Pt states his wife and daughter are around and he has hired help 2 days per week.  Need to confirm that family and CG can provide 24hr A for pt, if not will need to consider higher level of care and possibly SNF.      PT Assessment  Patient needs continued PT services    Follow Up Recommendations  Home health PT;Supervision/Assistance - 24 hour    Does the patient have the potential to tolerate intense rehabilitation      Barriers to Discharge        Equipment Recommendations   (L platform for RW.  )    Recommendations for Other Services OT consult   Frequency Min 3X/week    Precautions / Restrictions Precautions Precautions: Fall Precaution Comments: pt reports multiple falls at home Restrictions Weight Bearing Restrictions: Yes LUE Weight Bearing: Weight bear through elbow only   Pertinent Vitals/Pain Indicates L arm is sore.      Mobility  Bed Mobility Bed Mobility: Supine to Sit;Sitting - Scoot to Edge of Bed Supine to Sit: 3: Mod assist;HOB elevated Sitting - Scoot to Edge of Bed: 3: Mod assist Details for Bed Mobility Assistance: pt needs A to bring trunk up to sitting and to bring hips towards EOB.   Transfers Transfers: Sit to Stand;Stand to Sit Sit to Stand: 4: Min assist;With upper extremity assist;From bed Stand to Sit: 4: Min assist;With upper extremity assist;To chair/3-in-1 Details for Transfer Assistance: cues for use of R UE only.  pt with posterior lean with sit to stand and uncontrolled descent to chair to sit.    Ambulation/Gait Ambulation/Gait Assistance: 4: Min assist Ambulation Distance (Feet): 160 Feet Assistive device: Left platform walker Ambulation/Gait Assistance Details: pt generally unsteady with occasional LOBs laterally requiring MinA to prevent fall or bumping into wall.  Cues for use of RW and safety, however pt often tips walker so that only 1 or 2 legs are touching floor 2/2 pt LOB laterally.  pt indicates feeling dizzy, however states he is always dizzy.  pt also scissors L LE in front of R.  When asking pt about this he states he always does this because this is how he learned to march.   Gait Pattern: Step-through pattern;Decreased stride length;Scissoring;Trunk flexed;Narrow base of support Stairs: No Wheelchair Mobility Wheelchair Mobility: No    Exercises     PT Diagnosis: Difficulty walking  PT Problem List: Decreased strength;Decreased activity tolerance;Decreased balance;Decreased mobility;Decreased knowledge of use of DME;Decreased safety awareness PT Treatment Interventions: DME instruction;Gait training;Stair training;Functional mobility training;Therapeutic activities;Therapeutic exercise;Balance training;Patient/family education     PT Goals(Current goals can be found in the care plan section) Acute Rehab PT Goals Patient Stated Goal: Home PT Goal Formulation: With patient Time For Goal Achievement: 02/19/13 Potential to Achieve Goals: Fair  Visit Information  Last PT Received On: 02/05/13 Assistance Needed: +1 History of Present Illness: pt presents after fall at home resulting in L UE laceration ~27cm in length.         Prior Functioning  Home Living Family/patient expects to  be discharged to:: Private residence Living Arrangements: Spouse/significant other;Children Available Help at Discharge: Family;Available 24 hours/day Type of Home: House Home Access: Stairs to enter [employer] of Steps: 1 Entrance Stairs-Rails: None Home Layout: One  level Home Equipment: Walker - 2 wheels;Bedside commode;Wheelchair - manual Prior Function Level of Independence: Needs assistance Gait / Transfers Assistance Needed: Uses RW for ambulation, but states "sometimes I fall and sometimes I don't".   ADL's / Homemaking Assistance Needed: Family do all cooking and have hired help for cleaning.   Communication Communication: No difficulties Dominant Hand: Right    Cognition  Cognition Arousal/Alertness: Awake/alert Behavior During Therapy: WFL for tasks assessed/performed Overall Cognitive Status: No family/caregiver present to determine baseline cognitive functioning    Extremity/Trunk Assessment Upper Extremity Assessment Upper Extremity Assessment: LUE deficits/detail LUE Deficits / Details: In splint immobilizing L elbow and wrist.   LUE: Unable to fully assess due to immobilization Lower Extremity Assessment Lower Extremity Assessment: Generalized weakness Cervical / Trunk Assessment Cervical / Trunk Assessment: Kyphotic   Balance Balance Balance Assessed: Yes Static Standing Balance Static Standing - Balance Support: Bilateral upper extremity supported Static Standing - Level of Assistance: 4: Min assist  End of Session PT - End of Session Equipment Utilized During Treatment: Gait belt Activity Tolerance: Patient tolerated treatment well Patient left: in chair;with call bell/phone within reach Nurse Communication: Mobility status  GP Functional Assessment Tool Used: Clinical Judgement Functional Limitation: Mobility: Walking and moving around Mobility: Walking and Moving Around Current Status (J1914): At least 20 percent but less than 40 percent impaired, limited or restricted Mobility: Walking and Moving Around Goal Status [phone]): At least 1 percent but less than 20 percent impaired, limited or restricted   Sunny Schlein, [place] [phone] 02/05/2013, 9:31 AM

## 2013-02-08 ENCOUNTER — Encounter: Payer: Self-pay | Admitting: Podiatry

## 2013-02-08 ENCOUNTER — Ambulatory Visit (INDEPENDENT_AMBULATORY_CARE_PROVIDER_SITE_OTHER): Payer: Medicare Other | Admitting: Podiatry

## 2013-02-08 VITALS — BP 144/106 | HR 75 | Ht 63.0 in | Wt 135.0 lb

## 2013-02-08 DIAGNOSIS — M25579 Pain in unspecified ankle and joints of unspecified foot: Secondary | ICD-10-CM

## 2013-02-08 DIAGNOSIS — B351 Tinea unguium: Secondary | ICD-10-CM

## 2013-02-08 NOTE — Patient Instructions (Signed)
Seen for hypertrophic nails. All nails debrided. Return as needed.  

## 2013-02-08 NOTE — Progress Notes (Signed)
Subjective:  79 y.o. year old male patient presents with aid of walker and arm rest, requesting toe nails trimmed. He fell and had broken arm on left, which is in cast now.   Objective: Dermatologic: Thick yellow deformed nails x 10.  Digital corn at distal end of 3rd digit left.  No open skin lesions.  Vascular: Pedal pulses are all palpable.  Orthopedic: Contracted lesser digits bilatereal.  Neurologic: All epicritic and tactile sensations grossly intact.   Assessment:  Dystrophic mycotic nails x 10.  Contracted digits with digital corn 3rd left. Painful toes.   Treatment: All mycotic nails and corns debrided.  Return in 3 months or as needed.

## 2013-02-13 NOTE — Discharge Summary (Signed)
Physician Discharge Summary  Patient ID: Anthony Nicholson MRN: [id number] DOB/AGE: 77-Jul-1919 72 y.o.  Admit date: 02/04/2013 Discharge date: 02/05/2013  Admission Diagnoses: Deep tissue lacerations left arm  Discharge Diagnoses: same Active Problems:   * No active hospital problems. *   Discharged Condition: good  Hospital Course: Pt admitted to hospital through ER for deep tissue lacerations of left arm following fall at home.  Pt was taken to OR by Dr Victorino Dike where I&D and repair of lacerations was performed without complication.  Pt was recovered in the PACU and then transferred to the floor for further post operative care. All vital signs were stable and pt was cleared for discharge.    Consults: None  Significant Diagnostic Studies: none  Treatments: antibiotics: Ancef  Discharge Exam:  wn wd elderly male in nad. A and O x 4. Mood and affect normal. EOMI. Resp unlabored.  Dressing C/D/I, fingers well perfused and mobile.  Upper extremities NVI. Radial pulse 2+ bilaterally.  Normal sensation to light touch.     Disposition: 01-Home or Self Care  Discharge Orders   Future Appointments Provider Department Dept Phone   05/13/2013 2:30 PM Myeong Eyvonne Left [facility] [phone]   Future Orders Complete By Expires   Call MD / Call 911  As directed    Comments:     If you experience chest pain or shortness of breath, CALL 911 and be transported to the hospital emergency room.  If you develope a fever above 101 F, pus (white drainage) or increased drainage or redness at the wound, or calf pain, call your surgeon's office.   Constipation Prevention  As directed    Comments:     Drink plenty of fluids.  Prune juice may be helpful.  You may use a stool softener, such as Colace (over the counter) 100 mg twice a day.  Use MiraLax (over the counter) for constipation as needed.   Diet - low sodium heart healthy  As directed    Driving restrictions  As directed    Comments:      No driving for 2 weeks   Increase activity slowly as tolerated  As directed    Lifting restrictions  As directed    Comments:     No lifting for 2 weeks       Medication List         cholecalciferol 1000 UNITS tablet  Commonly known as:  VITAMIN D  Take 1,000 Units by mouth 2 (two) times daily.           Follow-up Information   Follow up with Toni Arthurs, MD. Schedule an appointment as soon as possible for a visit on 02/11/2013.   Specialty:  Orthopedic Surgery   Contact information:   [address] [place] [address] [phone]       Signed: [place] Poli 02/13/2013, 7:25 AM

## 2013-02-14 NOTE — Discharge Summary (Signed)
Agree with above 

## 2013-02-21 ENCOUNTER — Other Ambulatory Visit: Payer: Self-pay

## 2013-05-10 ENCOUNTER — Ambulatory Visit (INDEPENDENT_AMBULATORY_CARE_PROVIDER_SITE_OTHER): Payer: Medicare Other | Admitting: Podiatry

## 2013-05-10 ENCOUNTER — Encounter: Payer: Self-pay | Admitting: Podiatry

## 2013-05-10 VITALS — BP 143/101 | HR 71 | Ht 63.0 in | Wt 136.0 lb

## 2013-05-10 DIAGNOSIS — M79609 Pain in unspecified limb: Secondary | ICD-10-CM

## 2013-05-10 DIAGNOSIS — B351 Tinea unguium: Secondary | ICD-10-CM

## 2013-05-10 DIAGNOSIS — M25579 Pain in unspecified ankle and joints of unspecified foot: Secondary | ICD-10-CM

## 2013-05-10 MED ORDER — TERBINAFINE HCL 1 % EX CREA: 1.0000 "application " | TOPICAL_CREAM | Freq: Two times a day (BID) | CUTANEOUS | Status: DC

## 2013-05-10 NOTE — Progress Notes (Signed)
Subjective:  80 y.o. year old male patient presents requesting toe nails trimmed. No other complaints today.   Patient requests antifungal cream for redness in skin.   Objective: Dermatologic: Thick yellow deformed nails x 10.  No open skin lesions. Dry scaly skin. Vascular: Pedal pulses are all palpable.  Orthopedic: Contracted lesser digits bilatereal.  Neurologic: All epicritic and tactile sensations grossly intact.   Assessment:  Dystrophic mycotic nails x 10.  Painful toes.   Treatment: All mycotic nails and corns debrided.  Return in 3 months or as needed.

## 2013-05-10 NOTE — Patient Instructions (Signed)
Seen for hypertrophic nails. All nails debrided. Return in 3 months or as needed.  

## 2013-05-13 ENCOUNTER — Ambulatory Visit: Payer: Medicare Other | Admitting: Podiatry

## 2013-08-09 ENCOUNTER — Ambulatory Visit (INDEPENDENT_AMBULATORY_CARE_PROVIDER_SITE_OTHER): Payer: Medicare Other | Admitting: Podiatry

## 2013-08-09 ENCOUNTER — Encounter: Payer: Self-pay | Admitting: Podiatry

## 2013-08-09 VITALS — BP 134/72 | HR 81 | Ht 63.0 in | Wt 135.0 lb

## 2013-08-09 DIAGNOSIS — M79609 Pain in unspecified limb: Secondary | ICD-10-CM

## 2013-08-09 DIAGNOSIS — M79606 Pain in leg, unspecified: Secondary | ICD-10-CM | POA: Insufficient documentation

## 2013-08-09 DIAGNOSIS — B351 Tinea unguium: Secondary | ICD-10-CM

## 2013-08-09 NOTE — Progress Notes (Signed)
Subjective:  80 y.o. year old male patient presents aided by walker requesting toe nails trimmed. No other complaints today.   Objective: Dermatologic: Thick yellow deformed nails x 10.  No open skin lesions. Dry scaly skin.  Vascular: Pedal pulses are all palpable.  Orthopedic: Contracted lesser digits bilatereal.  Neurologic: All epicritic and tactile sensations grossly intact.   Assessment:  Dystrophic mycotic nails x 10.  Painful toes.   Treatment: All mycotic nails and corns debrided.  Return in 3 months or as needed.

## 2013-08-09 NOTE — Patient Instructions (Signed)
Seen for hypertrophic nails. All nails debrided. Return in 3 months or as needed.  

## 2013-11-15 ENCOUNTER — Encounter: Payer: Self-pay | Admitting: Podiatry

## 2013-11-15 ENCOUNTER — Ambulatory Visit (INDEPENDENT_AMBULATORY_CARE_PROVIDER_SITE_OTHER): Payer: Medicare Other | Admitting: Podiatry

## 2013-11-15 VITALS — BP 150/90 | HR 83

## 2013-11-15 DIAGNOSIS — B351 Tinea unguium: Secondary | ICD-10-CM

## 2013-11-15 DIAGNOSIS — M79606 Pain in leg, unspecified: Secondary | ICD-10-CM

## 2013-11-15 DIAGNOSIS — M79609 Pain in unspecified limb: Secondary | ICD-10-CM

## 2013-11-15 NOTE — Patient Instructions (Signed)
Seen for hypertrophic nails. All nails debrided. Return in 3 months or as needed.  

## 2013-11-15 NOTE — Progress Notes (Signed)
Subjective:  80 y.o. year old male patient presents accompanied by his daughter requesting toe nails trimmed.  His wife passed away few months ago and is scheduled to move in assisted living tomorrow.  No other complaints today.   Objective: Dermatologic: Thick yellow deformed nails x 10.  No open skin lesions. Dry scaly skin.  Vascular: Pedal pulses are all palpable.  Orthopedic: Contracted lesser digits bilatereal.  Neurologic: All epicritic and tactile sensations grossly intact.   Assessment:  Dystrophic mycotic nails x 10.  Painful toes.   Treatment: All mycotic nails and corns debrided.  Return in 3 months or as needed.

## 2014-02-14 ENCOUNTER — Ambulatory Visit: Payer: Medicare Other | Admitting: Podiatry

## 2014-02-25 ENCOUNTER — Encounter: Payer: Self-pay | Admitting: Podiatry

## 2014-02-25 ENCOUNTER — Ambulatory Visit (INDEPENDENT_AMBULATORY_CARE_PROVIDER_SITE_OTHER): Payer: Medicare Other | Admitting: Podiatry

## 2014-02-25 VITALS — BP 150/71 | HR 68

## 2014-02-25 DIAGNOSIS — B351 Tinea unguium: Secondary | ICD-10-CM

## 2014-02-25 DIAGNOSIS — M79606 Pain in leg, unspecified: Secondary | ICD-10-CM

## 2014-02-25 NOTE — Progress Notes (Signed)
Subjective:  80 y.o. year old male patient presents requesting toe nails trimmed.  No other complaints today.   Objective: Dermatologic: Thick yellow deformed nails x 10.  No open skin lesions. Dry scaly skin.  Vascular: Pedal pulses are all palpable.  Orthopedic: Contracted lesser digits bilatereal.  Neurologic: All epicritic and tactile sensations grossly intact.   Assessment:  Dystrophic mycotic nails x 10.  Painful toes.   Treatment: All mycotic nails and corns debrided.  Return in 3 months or as needed.

## 2014-02-25 NOTE — Patient Instructions (Signed)
Seen for hypertrophic nails. All nails debrided. Return in 3 months or as needed.  

## 2014-05-27 ENCOUNTER — Encounter: Payer: Self-pay | Admitting: Podiatry

## 2014-05-27 ENCOUNTER — Ambulatory Visit (INDEPENDENT_AMBULATORY_CARE_PROVIDER_SITE_OTHER): Payer: Medicare Other | Admitting: Podiatry

## 2014-05-27 VITALS — BP 125/60 | HR 72

## 2014-05-27 DIAGNOSIS — B351 Tinea unguium: Secondary | ICD-10-CM

## 2014-05-27 DIAGNOSIS — M79606 Pain in leg, unspecified: Secondary | ICD-10-CM

## 2014-05-27 NOTE — Progress Notes (Signed)
Subjective:  81 y.o. year old male patient presents requesting toe nails trimmed. He walks with cane and have stable gait.  Feet hurt when nails grown long.   Objective: Dermatologic: Thick yellow deformed nails x 10.  No open skin lesions. Dry scaly skin.  Vascular: Pedal pulses are all palpable.  Orthopedic: Contracted lesser digits bilatereal.  Neurologic: All epicritic and tactile sensations grossly intact.   Assessment:  Dystrophic mycotic nails x 10.  Painful toes.   Treatment: All mycotic nails and corns debrided.  Return in 3 months or as needed.

## 2014-05-27 NOTE — Patient Instructions (Signed)
Seen for hypertrophic nails. All nails debrided. Return in 3 months or as needed.  

## 2014-05-28 ENCOUNTER — Ambulatory Visit: Payer: Medicare Other | Admitting: Podiatry

## 2014-08-26 ENCOUNTER — Ambulatory Visit: Payer: Medicare Other | Admitting: Podiatry

## 2014-09-29 ENCOUNTER — Encounter (HOSPITAL_COMMUNITY): Payer: Self-pay

## 2014-09-29 ENCOUNTER — Inpatient Hospital Stay (HOSPITAL_COMMUNITY)
Admission: EM | Admit: 2014-09-29 | Discharge: 2014-10-03 | DRG: 683 | Disposition: A | Discharge status: Skilled Nursing Facility | Payer: Non-veteran care | Attending: Internal Medicine | Admitting: Internal Medicine

## 2014-09-29 ENCOUNTER — Inpatient Hospital Stay (HOSPITAL_COMMUNITY): Payer: Non-veteran care

## 2014-09-29 DIAGNOSIS — R338 Other retention of urine: Secondary | ICD-10-CM | POA: Diagnosis present

## 2014-09-29 DIAGNOSIS — D638 Anemia in other chronic diseases classified elsewhere: Secondary | ICD-10-CM | POA: Diagnosis present

## 2014-09-29 DIAGNOSIS — C61 Malignant neoplasm of prostate: Secondary | ICD-10-CM | POA: Diagnosis present

## 2014-09-29 DIAGNOSIS — N1330 Unspecified hydronephrosis: Secondary | ICD-10-CM | POA: Diagnosis present

## 2014-09-29 DIAGNOSIS — N401 Enlarged prostate with lower urinary tract symptoms: Secondary | ICD-10-CM | POA: Diagnosis present

## 2014-09-29 DIAGNOSIS — E871 Hypo-osmolality and hyponatremia: Secondary | ICD-10-CM | POA: Diagnosis present

## 2014-09-29 DIAGNOSIS — Z85828 Personal history of other malignant neoplasm of skin: Secondary | ICD-10-CM

## 2014-09-29 DIAGNOSIS — N179 Acute kidney failure, unspecified: Principal | ICD-10-CM | POA: Diagnosis present

## 2014-09-29 DIAGNOSIS — R972 Elevated prostate specific antigen [PSA]: Secondary | ICD-10-CM | POA: Diagnosis present

## 2014-09-29 DIAGNOSIS — E875 Hyperkalemia: Secondary | ICD-10-CM | POA: Diagnosis present

## 2014-09-29 DIAGNOSIS — R300 Dysuria: Secondary | ICD-10-CM | POA: Diagnosis present

## 2014-09-29 DIAGNOSIS — N19 Unspecified kidney failure: Secondary | ICD-10-CM

## 2014-09-29 DIAGNOSIS — R339 Retention of urine, unspecified: Secondary | ICD-10-CM | POA: Insufficient documentation

## 2014-09-29 LAB — BASIC METABOLIC PANEL
ANION GAP: 14 (ref 5–15)
BUN: 99 mg/dL — ABNORMAL HIGH (ref 6–20)
CO2: 21 mmol/L — ABNORMAL LOW (ref 22–32)
Calcium: 8.3 mg/dL — ABNORMAL LOW (ref 8.9–10.3)
Chloride: 99 mmol/L — ABNORMAL LOW (ref 101–111)
Creatinine, Ser: 5.87 mg/dL — ABNORMAL HIGH (ref 0.61–1.24)
GFR, EST AFRICAN AMERICAN: 8 mL/min — AB (ref >60)
GFR, EST NON AFRICAN AMERICAN: 7 mL/min — AB (ref >60)
Glucose, Bld: 91 mg/dL (ref 65–99)
POTASSIUM: 5.3 mmol/L — AB (ref 3.5–5.1)
Sodium: 134 mmol/L — ABNORMAL LOW (ref 135–145)

## 2014-09-29 LAB — URINALYSIS, ROUTINE W REFLEX MICROSCOPIC
BILIRUBIN URINE: NEGATIVE
Glucose, UA: NEGATIVE mg/dL
Ketones, ur: NEGATIVE mg/dL
Leukocytes, UA: NEGATIVE
Nitrite: NEGATIVE
Protein, ur: NEGATIVE mg/dL
Specific Gravity, Urine: 1.011 (ref 1.005–1.030)
Urobilinogen, UA: 0.2 mg/dL (ref 0.0–1.0)
pH: 5.5 (ref 5.0–8.0)

## 2014-09-29 LAB — CBC WITH DIFFERENTIAL/PLATELET
BASOS ABS: 0 10*3/uL (ref 0.0–0.1)
Basophils Relative: 0 % (ref 0–1)
EOS ABS: 0.1 10*3/uL (ref 0.0–0.7)
Eosinophils Relative: 1 % (ref 0–5)
HCT: 30.3 % — ABNORMAL LOW (ref 39.0–52.0)
Hemoglobin: 9.7 g/dL — ABNORMAL LOW (ref 13.0–17.0)
Lymphocytes Relative: 8 % — ABNORMAL LOW (ref 12–46)
Lymphs Abs: 0.7 10*3/uL (ref 0.7–4.0)
MCH: 29.5 pg (ref 26.0–34.0)
MCHC: 32 g/dL (ref 30.0–36.0)
MCV: 92.1 fL (ref 78.0–100.0)
Monocytes Absolute: 0.6 10*3/uL (ref 0.1–1.0)
Monocytes Relative: 7 % (ref 3–12)
NEUTROS ABS: 6.8 10*3/uL (ref 1.7–7.7)
NEUTROS PCT: 84 % — AB (ref 43–77)
PLATELETS: 206 10*3/uL (ref 150–400)
RBC: 3.29 MIL/uL — ABNORMAL LOW (ref 4.22–5.81)
RDW: 13.9 % (ref 11.5–15.5)
WBC: 8.1 10*3/uL (ref 4.0–10.5)

## 2014-09-29 LAB — URINE MICROSCOPIC-ADD ON

## 2014-09-29 MED ORDER — ASPIRIN EC 81 MG PO TBEC
81.0000 mg | DELAYED_RELEASE_TABLET | Freq: Every day | ORAL | Status: DC
  Administered 2014-09-29 – 2014-09-30 (×2): 81 mg via ORAL
  Filled 2014-09-29 (×4): qty 1

## 2014-09-29 MED ORDER — SODIUM CHLORIDE 0.9 % IV BOLUS (SEPSIS)
1000.0000 mL | Freq: Once | INTRAVENOUS | Status: AC
  Administered 2014-09-29: 1000 mL via INTRAVENOUS

## 2014-09-29 MED ORDER — ONDANSETRON HCL 4 MG PO TABS: 4.0000 mg | ORAL_TABLET | Freq: Four times a day (QID) | ORAL | Status: DC | PRN

## 2014-09-29 MED ORDER — SODIUM CHLORIDE 0.9 % IV SOLN
INTRAVENOUS | Status: DC
  Administered 2014-09-29 – 2014-09-30 (×2): via INTRAVENOUS

## 2014-09-29 MED ORDER — ONDANSETRON HCL 4 MG/2ML IJ SOLN
4.0000 mg | Freq: Four times a day (QID) | INTRAMUSCULAR | Status: DC | PRN
  Administered 2014-10-02: 4 mg via INTRAVENOUS

## 2014-09-29 MED ORDER — ACETAMINOPHEN 325 MG PO TABS
650.0000 mg | ORAL_TABLET | Freq: Four times a day (QID) | ORAL | Status: DC | PRN
  Administered 2014-09-29: 650 mg via ORAL
  Filled 2014-09-29: qty 2

## 2014-09-29 MED ORDER — MUSCLE RUB 10-15 % EX CREA
TOPICAL_CREAM | CUTANEOUS | Status: DC | PRN
  Administered 2014-09-29: 22:00:00 via TOPICAL
  Filled 2014-09-29: qty 85

## 2014-09-29 MED ORDER — CALCIUM CARBONATE ANTACID 500 MG PO CHEW
1.0000 | CHEWABLE_TABLET | ORAL | Status: DC | PRN
  Administered 2014-09-29: 200 mg via ORAL
  Filled 2014-09-29: qty 1

## 2014-09-29 NOTE — H&P (Signed)
[facility] History and Physical  Anthony Nicholson DGU:[id number] DOB: 04-Feb-1918 DOA: 09/29/2014  Referring physician: Dr. Darl Householder PCP: Serita Grammes, CNM   Chief Complaint: difficulty with urination   HPI:  Patient is a 81 year old male, fairly independent and healthy at baseline but with known history of basal cell carcinoma in remission, presented to [facility] emergency department with main concern of 3 days duration of trouble urinating. He explains he initially thought he was having difficulty with voiding due to constipation and has tried several laxative OTC which offered no relief. He reports somewhat similar problem in the past and self resolving, reports had PSA checked this year and was within normal limits. He denies fevers and chills, no specific abdominal concerns except pressure in the lower abdominal area. He denies nausea or vomiting, no chest pain, no shortness of breath.  In emergency department, patient noted to be hemodynamically stable, VSS, blood work notable for potassium 5.3, CR 5.87, PVR checked > 400 cc in bladder. Foley placed and TR[facility] asked to admit for further evaluation and management.  Assessment and Plan: Principal Problem:   Acute urinary retention - Appears to be post obstructive in etiology, agree with placing Foley for now - Monitor urine output - Patient may need to be discharged home with Foley catheter in place for at least 1-2 weeks - Follow-up with urologist in an outpatient setting recommend Active Problems:   Acute renal failure - Appears to be secondary to principal problem, renal ultrasound requested - Provide IV fluids and repeat BMP in the morning   Hyperkalemia - Secondary to acute renal failure, provide IV fluids as noted above and repeat BMP in the morning   Anemia of chronic disease, IDA - No signs of active bleeding - No indication for transfusion at this time   DVT prophylaxis  - encourage ambulation and use SCD's while in  bed  Radiological Exams on Admission: No results found.  Code Status: Full Family Communication: Pt at bedside Disposition Plan: Admit for further evaluation    Mart Piggs [facility] [phone]   Review of Systems:  Constitutional: Negative for fever, chills and malaise/fatigue. Negative for diaphoresis.  HENT: Negative for hearing loss, ear pain, nosebleeds, congestion, sore throat, neck pain, tinnitus and ear discharge.   Eyes: Negative for blurred vision, double vision, photophobia, pain, discharge and redness.  Respiratory: Negative for cough, hemoptysis, sputum production, shortness of breath, wheezing and stridor.   Cardiovascular: Negative for chest pain, palpitations, orthopnea, claudication and leg swelling.  Gastrointestinal: Negative for nausea, vomiting and abdominal pain. Negative for heartburn, constipation, blood in stool and melena.  Genitourinary: Negative for hematuria and flank pain.  Musculoskeletal: Negative for myalgias, back pain, joint pain and falls.  Skin: Negative for itching and rash.  Neurological: Negative for dizziness and weakness. Endo/Heme/Allergies: Negative for environmental allergies and polydipsia. Does not bruise/bleed easily.  Psychiatric/Behavioral: Negative for suicidal ideas. The patient is not nervous/anxious.      Past Medical History  Diagnosis Date  . Basal cell carcinoma   . Laceration of arm 02/04/2013    Past Surgical History  Procedure Laterality Date  . Incision and drainage Left 02/04/2013    left arm laceration    Dr Doran Durand  .  sharpnel removal    1943    . I&d extremity Left 02/04/2013    Procedure: IRRIGATION AND DEBRIDEMENT Left Arm Laceration with Repair as Necessary;  Surgeon: Wylene Simmer, MD;  Location: [place];  Service: Orthopedics;  Laterality: Left;  Social History:  reports that he has never smoked. He has never used smokeless tobacco. He reports that he does not drink alcohol or use illicit drugs.  No Known  Allergies  Patient denies family history of hypertension, cancers.  Prior to Admission medications   Medication Sig Start Date End Date Taking? Authorizing Provider  aspirin 81 MG tablet Take 81 mg by mouth daily.   Yes Historical Provider, MD  cholecalciferol (VITAMIN D) 1000 UNITS tablet Take 1,000 Units by mouth 2 (two) times daily.    Yes Historical Provider, MD  Miconazole Nitrate 2 % AERO Apply 1 application topically daily as needed (athlete's foot).   Yes Historical Provider, MD  Multiple Vitamins-Minerals (MULTIVITAMIN ADULT PO) Take 1 tablet by mouth daily.   Yes Historical Provider, MD  terbinafine (LAMISIL AT) 1 % cream Apply 1 application topically 2 (two) times daily. Patient not taking: Reported on 09/29/2014 05/10/13   Myeong Roxine Caddy, DPM    Physical Exam: Filed Vitals:   09/29/14 0957 09/29/14 1127  BP: 138/68 122/88  Pulse: 97 79  Temp: 98.3 F (36.8 C)   TempSrc: Oral   Resp: 16 18  SpO2:  99%    Physical Exam  Constitutional: Appears well-developed and well-nourished. No distress.  HENT: Normocephalic. External right and left ear normal. Oropharynx is clear and moist.  Eyes: Conjunctivae and EOM are normal. PERRLA, no scleral icterus.  Neck: Normal ROM. Neck supple. No JVD. No tracheal deviation. No thyromegaly.  CVS: RRR, S1/S2 +, no gallops, no carotid bruit.  Pulmonary: Effort and breath sounds normal, no stridor, rhonchi, wheezes, rales.  Abdominal: Soft. BS +,  no distension, tenderness, rebound or guarding.  Musculoskeletal: Normal range of motion. No edema and no tenderness.  Lymphadenopathy: No lymphadenopathy noted, cervical, inguinal. Neuro: Alert. Normal reflexes, muscle tone coordination. No cranial nerve deficit. Skin: Skin is warm and dry. No rash noted. Not diaphoretic. No erythema. No pallor.  Psychiatric: Normal mood and affect. Behavior, judgment, thought content normal.   Labs on Admission:  Basic Metabolic Panel:  Recent Labs Lab  09/29/14 1035  NA 134*  K 5.3*  CL 99*  CO2 21*  GLUCOSE 91  BUN PENDING  CREATININE 5.87*  CALCIUM 8.3*   CBC:  Recent Labs Lab 09/29/14 1035  WBC 8.1  NEUTROABS 6.8  HGB 9.7*  HCT 30.3*  MCV 92.1  PLT 206    EKG: Pending   If 7PM-7AM, please contact night-coverage [email or web address] Password [email or web address] 09/29/2014, 11:40 AM

## 2014-09-29 NOTE — ED Notes (Signed)
420 for bladder scan

## 2014-09-29 NOTE — ED Provider Notes (Addendum)
CSN: [id number]     Arrival date & time 09/29/14  0945 History   First MD Initiated Contact with Patient 09/29/14 [phone]     Chief Complaint  Patient presents with  . Urinary Retention     (Consider location/radiation/quality/duration/timing/severity/associated sxs/prior Treatment) The history is provided by the patient.  Anthony Nicholson is a 81 y.o. male history of basal cell carcinoma in remission here presenting with trouble urination. Patient states that for the last 3 days he has been having trouble urinating. He thought was constipated so tried to give himself some laxitives was able to defecate but not able to urinate. Had some dribbling for last several days but unable to urinate since yesterday. States that his abdomen is more distended and complaints of some abdominal pressure. Denies any vomiting. Denies any fevers. States that he had a normal PSA earlier this year.    Past Medical History  Diagnosis Date  . Basal cell carcinoma   . Laceration of arm 02/04/2013   Past Surgical History  Procedure Laterality Date  . Incision and drainage Left 02/04/2013    left arm laceration    Dr Doran Durand  .  sharpnel removal    1943    . I&d extremity Left 02/04/2013    Procedure: IRRIGATION AND DEBRIDEMENT Left Arm Laceration with Repair as Necessary;  Surgeon: Wylene Simmer, MD;  Location: [place];  Service: Orthopedics;  Laterality: Left;   No family history on file. History  Substance Use Topics  . Smoking status: Never Smoker   . Smokeless tobacco: Never Used  . Alcohol Use: No    Review of Systems  Gastrointestinal: Positive for abdominal pain.  Genitourinary: Positive for decreased urine volume and difficulty urinating.  All other systems reviewed and are negative.     Allergies  Review of patient's allergies indicates no known allergies.  Home Medications   Prior to Admission medications   Medication Sig Start Date End Date Taking? Authorizing Provider  aspirin 81 MG  tablet Take 81 mg by mouth daily.   Yes Historical Provider, MD  cholecalciferol (VITAMIN D) 1000 UNITS tablet Take 1,000 Units by mouth 2 (two) times daily.    Yes Historical Provider, MD  Miconazole Nitrate 2 % AERO Apply 1 application topically daily as needed (athlete's foot).   Yes Historical Provider, MD  Multiple Vitamins-Minerals (MULTIVITAMIN ADULT PO) Take 1 tablet by mouth daily.   Yes Historical Provider, MD  terbinafine (LAMISIL AT) 1 % cream Apply 1 application topically 2 (two) times daily. Patient not taking: Reported on 09/29/2014 05/10/13   Myeong O Sheard, DPM   BP 122/88 mmHg  Pulse 79  Temp(Src) 98.3 F (36.8 C) (Oral)  Resp 18  SpO2 99% Physical Exam  Constitutional: He is oriented to person, place, and time.  Chronically ill   HENT:  Head: Normocephalic.  Eyes: Conjunctivae are normal. Pupils are equal, round, and reactive to light.  Neck: Normal range of motion. Neck supple.  Cardiovascular: Normal rate, regular rhythm and normal heart sounds.   Pulmonary/Chest: Effort normal and breath sounds normal. No respiratory distress. He has no wheezes. He has no rales.  Abdominal: Soft.  + distended bladder, mildly tender. No CVAT   Genitourinary:  Rectal- enlarged prostate, firm.   Musculoskeletal: Normal range of motion. He exhibits no edema or tenderness.  Neurological: He is alert and oriented to person, place, and time. No cranial nerve deficit. Coordination normal.  Skin: Skin is warm.  Psychiatric: He has a normal mood  and affect. His behavior is normal. Judgment and thought content normal.  Nursing note and vitals reviewed.   ED Course  Procedures (including critical care time) Labs Review Labs Reviewed  CBC WITH DIFFERENTIAL/PLATELET - Abnormal; Notable for the following:    RBC 3.29 (*)    Hemoglobin 9.7 (*)    HCT 30.3 (*)    Neutrophils Relative % 84 (*)    Lymphocytes Relative 8 (*)    All other components within normal limits  BASIC METABOLIC  PANEL - Abnormal; Notable for the following:    Sodium 134 (*)    Potassium 5.3 (*)    Chloride 99 (*)    CO2 21 (*)    BUN 99 (*)    Creatinine, Ser 5.87 (*)    Calcium 8.3 (*)    GFR calc non Af Amer 7 (*)    GFR calc Af Amer 8 (*)    All other components within normal limits  URINALYSIS, ROUTINE W REFLEX MICROSCOPIC (NOT AT [facility]) - Abnormal; Notable for the following:    Hgb urine dipstick SMALL (*)    All other components within normal limits  URINE MICROSCOPIC-ADD ON    Imaging Review No results found.   EKG Interpretation None      MDM   Final diagnoses:  None    Anthony Nicholson is a 81 y.o. male here with trouble urinating. Likely BPH causing urinary retention. Bladder scan showed 400 cc in bladder. Will place foley and check BMP.   11:41 AM Cr. 5.8, baseline 1. Likely from urinary retention. Nursing placed foley and drained out about 450 cc. Will admit for obs under med/surg.    Wandra Arthurs, MD 09/29/14 [place] [name], MD 09/29/14 2123

## 2014-09-29 NOTE — ED Notes (Signed)
Not able to void x3 days, very minimal dribbles, abd pressure. Denies any prostate issues. No fevers, no n/v.

## 2014-09-29 NOTE — Consult Note (Addendum)
Subjective: Anthony Nicholson is a delightful 81 yo WM who is admitted to Dr. Tedra Senegal service for urinary retention with acute renal insufficiency.  I was actually contacted by the patient's son-in-law who is a neighbor and found that a urology consult was mentioned in the admission note so I came to see Anthony Nicholson.   He had the onset 2-3 weeks ago of some progressive voiding difficulty with frequent small voids.  Over the last 2-3 days his symptoms worsened in minimal output and lower abdominal discomfort.  He had no dysuria or hematuria and reports that he has had a slow stream for sometime.   An [place] PVR was about 457ml and a foley was placed the ER with relief of the abdominal discomfort.   His Cr is up to 5.87 from a baseline of 0.99 (per report from his son-in-law).   He is also reported to have a normal PSA on a recent draw.   He has no prior GU history and has been remarkably healthy throughout his life.  A renal US today showed bilateral hydronephrosis.  ROS:  Review of Systems  Cardiovascular: Positive for chest pain (for which he has taken tums with relief. ).  Gastrointestinal: Positive for abdominal pain.  Genitourinary: Positive for urgency.  All other systems reviewed and are negative.  No Known Allergies  Past Medical History  Diagnosis Date  . Basal cell carcinoma   . Laceration of arm 02/04/2013    Past Surgical History  Procedure Laterality Date  . Incision and drainage Left 02/04/2013    left arm laceration    Dr Doran Durand  .  sharpnel removal    1943    . I&d extremity Left 02/04/2013    Procedure: IRRIGATION AND DEBRIDEMENT Left Arm Laceration with Repair as Necessary;  Surgeon: Wylene Simmer, MD;  Location: [place];  Service: Orthopedics;  Laterality: Left;    History   Social History  . Marital Status: Single    Spouse Name: N/A  . Number of Children: N/A  . Years of Education: N/A   Occupational History  . Not on file.   Social History Main Topics  . Smoking  status: Never Smoker   . Smokeless tobacco: Never Used  . Alcohol Use: No  . Drug Use: No  . Sexual Activity: No   Other Topics Concern  . Not on file   Social History Narrative    History reviewed. No pertinent family history.  Anti-infectives: Anti-infectives    None      Current Facility-Administered Medications  Medication Dose Route Frequency Provider Last Rate Last Dose  . 0.9 %  sodium chloride infusion   Intravenous Continuous Theodis Blaze, MD 50 mL/hr at 09/29/14 1351    . aspirin EC tablet 81 mg  81 mg Oral Daily Theodis Blaze, MD   81 mg at 09/29/14 1426  . calcium carbonate (TUMS - dosed in mg elemental calcium) chewable tablet 200 mg of elemental calcium  1 tablet Oral PRN Theodis Blaze, MD   200 mg of elemental calcium at 09/29/14 1742  . ondansetron (ZOFRAN) tablet 4 mg  4 mg Oral Q6H PRN Theodis Blaze, MD       Or  . ondansetron [facility]) injection 4 mg  4 mg Intravenous Q6H PRN Theodis Blaze, MD         Objective: Vital signs in last 24 hours: Temp:  [98 F (36.7 C)-98.3 F (36.8 C)] 98 F (36.7  C) (06/13 1520) Pulse Rate:  [79-97] 86 (06/13 1520) Resp:  [16-18] 18 (06/13 1520) BP: (122-138)/(57-88) 128/57 mmHg (06/13 1520) SpO2:  [99 %-100 %] 100 % (06/13 1520) Weight:  [61.236 kg (135 lb)] 61.236 kg (135 lb) (06/13 1520)  Intake/Output from previous day:   Intake/Output this shift: Total I/O In: 127.5 [P.O.:120; I.V.:7.5] Out: 2600 [Urine:2600]   Physical Exam  Constitutional: He is oriented to person, place, and time and well-developed, well-nourished, and in no distress.  HENT:  Head: Normocephalic and atraumatic.  Neck: Normal range of motion. Neck supple. No thyromegaly present.  Cardiovascular: Normal rate, regular rhythm and normal heart sounds.   He has occasional PVC's.   Pulmonary/Chest: Effort normal and breath sounds normal. No respiratory distress.  Abdominal: Soft. Bowel sounds are normal. He exhibits no distension and no  mass. There is no tenderness.  Genitourinary:  He has a normal phallus with a foley at the meatus and a normal scrotum and testes.     He has normal anal tone with no rectal masses and an empty vault.   The prostate is nodular and indurated with some enlargement.   Musculoskeletal: Normal range of motion. He exhibits no edema or tenderness.  Neurological: He is alert and oriented to person, place, and time.  Skin: Skin is warm and dry.  Psychiatric: Mood and affect normal.    Lab Results:   Recent Labs  09/29/14 1035  WBC 8.1  HGB 9.7*  HCT 30.3*  PLT 206   BMET  Recent Labs  09/29/14 1035  NA 134*  K 5.3*  CL 99*  CO2 21*  GLUCOSE 91  BUN 99*  CREATININE 5.87*  CALCIUM 8.3*   PT/INR No results for input(s): LABPROT, INR in the last 72 hours. ABG No results for input(s): PHART, HCO3 in the last 72 hours.  Invalid input(s): PCO2, PO2  Studies/Results: US Renal  09/29/2014   CLINICAL DATA:  Renal failure  EXAM: RENAL / URINARY TRACT ULTRASOUND COMPLETE  COMPARISON:  None.  FINDINGS: Right Kidney:  Length: 9 cm from generalized atrophy. Mild hydronephrosis, accentuated by an extrarenal pelvis. No masses. Normal cortical echogenicity  Left Kidney:  Length: 9 cm from generalized atrophy. Mild hydronephrosis. Normal cortical echogenicity. No masses.  Bladder:  Moderately distended, despite indwelling Foley. The wall is circumferentially thickened with trabeculated appearance posteriorly.  IMPRESSION: 1. Mild bilateral hydronephrosis, greater on the right. 2. Foley catheter with partial drainage of the bladder. 3. Bladder wall thickening, often from chronic outlet obstruction.   Electronically Signed   By: Monte Fantasia M.D.   On: 09/29/2014 14:24   Labs reviewed.  [place] films and report reviewed.  Admission and ER notes reviewed.   Assessment: Acute urinary retention with obstructive uropathy and ARI.  He has a nodular prostate with obstruction and could have a prostate  cancer.   The PSA's have been reported as normal but I don't have those results yet.   Plan: Continue foley drainage which he will need for 1-2 weeks prior to a voiding trial. If it is felt to be safe, he should be started on tamsulosin 0.4mg . Although it can be falsely elevated in the face of acute retention, I will order a PSA because my suspicion that he might have prostate cancer.  I will continue to follow.   I have reviewed labs from the [place] from March.  His Cr was 0.99 and his calcium was 8.4.   He has a borderline Vit D of  29.4 and a hgb of 11.7 but the remainder of his labs were unremarkable.   He didn't have a PSA with these labs.   CC: Dr. Mart Piggs    LOS: 0 days    Malka So 09/29/2014

## 2014-09-30 DIAGNOSIS — R338 Other retention of urine: Secondary | ICD-10-CM | POA: Diagnosis not present

## 2014-09-30 LAB — BASIC METABOLIC PANEL
ANION GAP: 4 — AB (ref 5–15)
BUN: 69 mg/dL — ABNORMAL HIGH (ref 6–20)
CO2: 24 mmol/L (ref 22–32)
Calcium: 7.5 mg/dL — ABNORMAL LOW (ref 8.9–10.3)
Chloride: 112 mmol/L — ABNORMAL HIGH (ref 101–111)
Creatinine, Ser: 2.57 mg/dL — ABNORMAL HIGH (ref 0.61–1.24)
GFR calc Af Amer: 23 mL/min — ABNORMAL LOW (ref >60)
GFR calc non Af Amer: 20 mL/min — ABNORMAL LOW (ref >60)
Glucose, Bld: 89 mg/dL (ref 65–99)
Potassium: 4.3 mmol/L (ref 3.5–5.1)
SODIUM: 140 mmol/L (ref 135–145)

## 2014-09-30 LAB — CBC
HEMATOCRIT: 27.7 % — AB (ref 39.0–52.0)
HEMOGLOBIN: 9.1 g/dL — AB (ref 13.0–17.0)
MCH: 30.3 pg (ref 26.0–34.0)
MCHC: 32.9 g/dL (ref 30.0–36.0)
MCV: 92.3 fL (ref 78.0–100.0)
Platelets: 217 10*3/uL (ref 150–400)
RBC: 3 MIL/uL — ABNORMAL LOW (ref 4.22–5.81)
RDW: 13.9 % (ref 11.5–15.5)
WBC: 5.7 10*3/uL (ref 4.0–10.5)

## 2014-09-30 LAB — PSA: PSA: 58.14 ng/mL — ABNORMAL HIGH (ref 0.00–4.00)

## 2014-09-30 NOTE — Progress Notes (Signed)
Patient ID: Anthony Nicholson, male   DOB: Jul 12, 1917, 81 y.o.   MRN: [id number]    Subjective: Anthony Nicholson is doing well without complaints this morning and the urine remains clear.  His Cr has declined to 2.57.  His PSA is 58.14 which is very worrisome for a prostate cancer.   ROS:  Review of Systems  Constitutional: Negative for fever.  Gastrointestinal: Negative for abdominal pain.    Anti-infectives: Anti-infectives    None      Current Facility-Administered Medications  Medication Dose Route Frequency Provider Last Rate Last Dose  . 0.9 %  sodium chloride infusion   Intravenous Continuous Theodis Blaze, MD 50 mL/hr at 09/30/14 [phone]    . acetaminophen (TYLENOL) tablet 650 mg  650 mg Oral Q6H PRN Gardiner Barefoot, NP   650 mg at 09/29/14 2134  . aspirin EC tablet 81 mg  81 mg Oral Daily Theodis Blaze, MD   81 mg at 09/29/14 1426  . calcium carbonate (TUMS - dosed in mg elemental calcium) chewable tablet 200 mg of elemental calcium  1 tablet Oral PRN Theodis Blaze, MD   200 mg of elemental calcium at 09/29/14 1742  . MUSCLE RUB CREA   Topical PRN Gardiner Barefoot, NP      . ondansetron [facility]) tablet 4 mg  4 mg Oral Q6H PRN Theodis Blaze, MD       Or  . ondansetron [facility]) injection 4 mg  4 mg Intravenous Q6H PRN Theodis Blaze, MD         Objective: Vital signs in last 24 hours: Temp:  [97.8 F (36.6 C)-98.1 F (36.7 C)] 97.8 F (36.6 C) (06/14 0515) Pulse Rate:  [79-87] 80 (06/14 0515) Resp:  [16-18] 16 (06/14 0515) BP: (115-129)/(50-88) 129/50 mmHg (06/14 0515) SpO2:  [98 %-100 %] 98 % (06/14 0515) Weight:  [60.192 kg (132 lb 11.2 oz)-61.236 kg (135 lb)] 60.192 kg (132 lb 11.2 oz) (06/14 0639)  Intake/Output from previous day: 06/13 0701 - 06/14 0700 In: 1216.7 [P.O.:480; I.V.:736.7] Out: 4800 [Urine:4800] Intake/Output this shift: Total I/O In: 280 [P.O.:280] Out: -    Physical Exam  Constitutional: Anthony Nicholson is well-developed, well-nourished, and in no  distress.  Genitourinary:  Foley is draining clear urine.     Lab Results:   Recent Labs  09/29/14 1035 09/30/14 0425  WBC 8.1 5.7  HGB 9.7* 9.1*  HCT 30.3* 27.7*  PLT 206 217   BMET  Recent Labs  09/29/14 1035 09/30/14 0425  NA 134* 140  K 5.3* 4.3  CL 99* 112*  CO2 21* 24  GLUCOSE 91 89  BUN 99* 69*  CREATININE 5.87* 2.57*  CALCIUM 8.3* 7.5*   PT/INR No results for input(s): LABPROT, INR in the last 72 hours. ABG No results for input(s): PHART, HCO3 in the last 72 hours.  Invalid input(s): PCO2, PO2  Studies/Results: US Renal  09/29/2014   CLINICAL DATA:  Renal failure  EXAM: RENAL / URINARY TRACT ULTRASOUND COMPLETE  COMPARISON:  None.  FINDINGS: Right Kidney:  Length: 9 cm from generalized atrophy. Mild hydronephrosis, accentuated by an extrarenal pelvis. No masses. Normal cortical echogenicity  Left Kidney:  Length: 9 cm from generalized atrophy. Mild hydronephrosis. Normal cortical echogenicity. No masses.  Bladder:  Moderately distended, despite indwelling Foley. The wall is circumferentially thickened with trabeculated appearance posteriorly.  IMPRESSION: 1. Mild bilateral hydronephrosis, greater on the right. 2. Foley catheter with partial drainage of the bladder. 3. Bladder wall  thickening, often from chronic outlet obstruction.   Electronically Signed   By: Monte Fantasia M.D.   On: 09/29/2014 14:24     Assessment: Acute Retention with ARI now resolving with a foley.  Nodular prostate with elevated PSA which is worrisome for prostate cancer.   Plan: Anthony Nicholson is going to need a prostate biopsy at some point to further clarify his diagnosis.   Anthony Nicholson should be ready for discharge soon and I will try to set him up for the biopsy as an outpatient.  CC: Dr. Mart Piggs.     LOS: 1 day    Hayleigh Bawa J 09/30/2014

## 2014-09-30 NOTE — Progress Notes (Signed)
Patient ID: Anthony Nicholson, male   DOB: [birth year], 81 y.o.   MRN: [id number]  [facility] PROGRESS NOTE  Anthony Nicholson HQI:[id number] DOB: 09-06-1917 DOA: 09/29/2014 PCP: Serita Grammes, CNM   Brief narrative:    Patient is a 81 year old male, fairly independent and healthy at baseline but with known history of basal cell carcinoma in remission, presented to [facility] emergency department with main concern of 3 days duration of trouble urinating. He explains he initially thought he was having difficulty with voiding due to constipation and has tried several laxative OTC which offered no relief. He reports somewhat similar problem in the past and self resolving, reports had PSA checked this year and was within normal limits. He denies fevers and chills, no specific abdominal concerns except pressure in the lower abdominal area. He denies nausea or vomiting, no chest pain, no shortness of breath.  In emergency department, patient noted to be hemodynamically stable, VSS, blood work notable for potassium 5.3, CR 5.87, PVR checked > 400 cc in bladder. Foley placed and TR[facility] asked to admit for further evaluation and management.  Assessment/Plan:    Principal Problem:  Acute urinary retention with obstructive uropathy and ARF - PSA significantly elevated, concern for prostate cancer - appreciate Dr. Ralene Muskrat assistance - pt reports feeling better this AM, Cr is trending down  - continue foley drainage which he will need for 1-2 weeks prior to a voiding trial. - continue Tamsulosin 0.4 mg O QD  Active Problems:  Acute renal failure - Appears to be secondary to principal problem - renal US with bilateral mild hydro - Cr is trending down since foley placed and IVF provided  - hold IVF and repeat BMP in AM  Hyperkalemia - Secondary to acute renal failure, resolved   Anemia of chronic disease, IDA - No signs of active bleeding - No indication for transfusion at this time    Hyponatremia - pre renal, IVF provided and Na is now WNL  DVT prophylaxis  - encourage ambulation and use SCD's while in bed  Code Status: Full.  Family Communication:  plan of care discussed with family at bedside  Disposition Plan: Home when stable.   IV access:  Peripheral IV  Procedures and diagnostic studies:    US Renal 09/29/2014  Mild bilateral hydronephrosis, greater on the right. 2. Foley catheter with partial drainage of the bladder. 3. Bladder wall thickening, often from chronic outlet obstruction.     Medical Consultants:  Urology    Other Consultants:  None  IAnti-Infectives:   None  Faye Ramsay, MD  [facility] Pager [phone]  If 7PM-7AM, please contact night-coverage [email or web address] Password [email or web address] 09/30/2014, 12:12 PM   LOS: 1 day   HPI/Subjective: No events overnight.   Objective: Filed Vitals:   09/29/14 1520 09/29/14 2101 09/30/14 0515 09/30/14 0639  BP: 128/57 115/50 129/50   Pulse: 86 87 80   Temp: 98 F (36.7 C) 98.1 F (36.7 C) 97.8 F (36.6 C)   TempSrc: Oral Oral Oral   Resp: 18 16 16    Height: 5\' 6"  (1.676 m)     Weight: 61.236 kg (135 lb)   60.192 kg (132 lb 11.2 oz)  SpO2: 100% 98% 98%     Intake/Output Summary (Last 24 hours) at 09/30/14 1212 Last data filed at 09/30/14 0943  Gross per 24 hour  Intake 1496.66 ml  Output   4800 ml  Net -3303.34 ml    Exam:   General:  Pt is alert, follows commands  appropriately, not in acute distress  Cardiovascular: Regular rate and rhythm, S1/S2, no murmurs, no rubs, no gallops  Respiratory: Clear to auscultation bilaterally, no wheezing, no crackles, no rhonchi  Abdomen: Soft, non tender, non distended, bowel sounds present, no guarding  Data Reviewed: Basic Metabolic Panel:  Recent Labs Lab 09/29/14 1035 09/30/14 0425  NA 134* 140  K 5.3* 4.3  CL 99* 112*  CO2 21* 24  GLUCOSE 91 89  BUN 99* 69*  CREATININE 5.87* 2.57*  CALCIUM 8.3* 7.5*   CBC:  Recent Labs Lab  09/29/14 1035 09/30/14 0425  WBC 8.1 5.7  NEUTROABS 6.8  --   HGB 9.7* 9.1*  HCT 30.3* 27.7*  MCV 92.1 92.3  PLT 206 217   Scheduled Meds: . aspirin EC  81 mg Oral Daily   Continuous Infusions: . sodium chloride 50 mL/hr at 09/30/14 [phone]

## 2014-10-01 ENCOUNTER — Other Ambulatory Visit: Payer: Self-pay | Admitting: Urology

## 2014-10-01 DIAGNOSIS — R972 Elevated prostate specific antigen [PSA]: Secondary | ICD-10-CM | POA: Diagnosis not present

## 2014-10-01 DIAGNOSIS — E875 Hyperkalemia: Secondary | ICD-10-CM

## 2014-10-01 DIAGNOSIS — R339 Retention of urine, unspecified: Secondary | ICD-10-CM | POA: Insufficient documentation

## 2014-10-01 DIAGNOSIS — C61 Malignant neoplasm of prostate: Secondary | ICD-10-CM | POA: Diagnosis not present

## 2014-10-01 DIAGNOSIS — R338 Other retention of urine: Secondary | ICD-10-CM

## 2014-10-01 DIAGNOSIS — D638 Anemia in other chronic diseases classified elsewhere: Secondary | ICD-10-CM

## 2014-10-01 DIAGNOSIS — N179 Acute kidney failure, unspecified: Principal | ICD-10-CM

## 2014-10-01 DIAGNOSIS — N402 Nodular prostate without lower urinary tract symptoms: Secondary | ICD-10-CM

## 2014-10-01 LAB — BASIC METABOLIC PANEL
ANION GAP: 7 (ref 5–15)
BUN: 42 mg/dL — ABNORMAL HIGH (ref 6–20)
CHLORIDE: 110 mmol/L (ref 101–111)
CO2: 25 mmol/L (ref 22–32)
Calcium: 8 mg/dL — ABNORMAL LOW (ref 8.9–10.3)
Creatinine, Ser: 1.41 mg/dL — ABNORMAL HIGH (ref 0.61–1.24)
GFR calc non Af Amer: 41 mL/min — ABNORMAL LOW (ref >60)
GFR, EST AFRICAN AMERICAN: 48 mL/min — AB (ref >60)
Glucose, Bld: 95 mg/dL (ref 65–99)
POTASSIUM: 3.9 mmol/L (ref 3.5–5.1)
SODIUM: 142 mmol/L (ref 135–145)

## 2014-10-01 LAB — SURGICAL PCR SCREEN
MRSA, PCR: NEGATIVE
Staphylococcus aureus: NEGATIVE

## 2014-10-01 LAB — CBC
HCT: 27.9 % — ABNORMAL LOW (ref 39.0–52.0)
Hemoglobin: 8.9 g/dL — ABNORMAL LOW (ref 13.0–17.0)
MCH: 29.3 pg (ref 26.0–34.0)
MCHC: 31.9 g/dL (ref 30.0–36.0)
MCV: 91.8 fL (ref 78.0–100.0)
PLATELETS: 227 10*3/uL (ref 150–400)
RBC: 3.04 MIL/uL — ABNORMAL LOW (ref 4.22–5.81)
RDW: 13.7 % (ref 11.5–15.5)
WBC: 6.8 10*3/uL (ref 4.0–10.5)

## 2014-10-01 MED ORDER — GENTAMICIN SULFATE 40 MG/ML IJ SOLN
5.0000 mg/kg | INTRAVENOUS | Status: AC
  Administered 2014-10-02: 300 mg via INTRAVENOUS
  Filled 2014-10-01: qty 7.5

## 2014-10-01 MED ORDER — SENNOSIDES-DOCUSATE SODIUM 8.6-50 MG PO TABS
1.0000 | ORAL_TABLET | Freq: Every day | ORAL | Status: DC
Start: 2014-10-01 — End: 2014-10-03
  Administered 2014-10-01: 1 via ORAL
  Filled 2014-10-01 (×3): qty 1

## 2014-10-01 MED ORDER — FLEET ENEMA 7-19 GM/118ML RE ENEM
1.0000 | ENEMA | Freq: Once | RECTAL | Status: AC
  Administered 2014-10-02: 1 via RECTAL
  Filled 2014-10-01: qty 1

## 2014-10-01 MED ORDER — SODIUM CHLORIDE 0.9 % IV SOLN
INTRAVENOUS | Status: AC
  Administered 2014-10-01 – 2014-10-02 (×3): via INTRAVENOUS

## 2014-10-01 MED ORDER — CEFTRIAXONE SODIUM IN DEXTROSE 40 MG/ML IV SOLN
2.0000 g | Freq: Once | INTRAVENOUS | Status: AC
  Administered 2014-10-02: 2 g via INTRAVENOUS
  Filled 2014-10-01: qty 50

## 2014-10-01 MED ORDER — POLYETHYLENE GLYCOL 3350 17 G PO PACK
17.0000 g | PACK | Freq: Every day | ORAL | Status: DC
  Administered 2014-10-01 – 2014-10-03 (×2): 17 g via ORAL
  Filled 2014-10-01 (×3): qty 1

## 2014-10-01 MED ORDER — BISACODYL 5 MG PO TBEC
5.0000 mg | DELAYED_RELEASE_TABLET | Freq: Every day | ORAL | Status: DC | PRN
Start: 2014-10-01 — End: 2014-10-03

## 2014-10-01 NOTE — Progress Notes (Signed)
[facility] PROGRESS NOTE  Avanish Cerullo JZP:[id number] DOB: 12/23/1917 DOA: 09/29/2014 PCP: Serita Grammes, CNM  Brief interval history Patient is a 81 year old male, fairly independent and healthy at baseline but with known history of basal cell carcinoma in remission, presented to [facility] emergency department with main concern of 3 days duration of trouble urinating. He explains he initially thought he was having difficulty with voiding due to constipation and has tried several laxative OTC which offered no relief. He reports somewhat similar problem in the past and self resolving, reports had PSA checked this year and was within normal limits. He denies fevers and chills, no specific abdominal concerns except pressure in the lower abdominal area. He denies nausea or vomiting, no chest pain, no shortness of breath.  In emergency department, patient noted to be hemodynamically stable, VSS, blood work notable for potassium 5.3, CR 5.87, PVR checked > 400 cc in bladder. Foley placed and TRH asked to admit for further evaluation and management.     Assessment/Plan: #1 acute urinary retention with obstructive uropathy/hydronephrosis and acute renal failure Questionable etiology. Patient with elevated PSA with concerns for prostate cancer. Foley catheter with good drainage. Renal function trending down. Will need to continue Foley catheter for the next 1-2 weeks prior to a voiding trial. Continue Flomax. Patient has been seen in consultation by urology who are recommending a prostate biopsy and cystoscopy to be done tomorrow. Urology following and appreciate input and recommendations.  #2 acute renal failure Secondary to obstructive uropathy. Renal function improving post Foley catheter placement. Follow.  #3 nodular prostate with elevated PSA  Patient has been seen in consultation by urology who are recommending a prostate biopsy as well as cystoscopy to be done tomorrow. Per  urology.  #4 hyperkalemia Secondary to acute renal failure. Resolved.  #5 anemia of chronic disease No signs of active bleeding. Hemoglobin currently at 8.9 from 9.1 yesterday. Follow H&H.  #6 hyponatremia Likely secondary to hypovolemic hyponatremia. Resolved.  #7 prophylaxis SCDs for DVT prophylaxis.  Code Status: Full Family Communication: Updated daughter and son-in-law at bedside. Disposition Plan: To skilled nursing facility when medically stable.   Consultants:  Urology: Dr.Wrenn 09/29/2014  Procedures:  Renal ultrasound 09/29/2014  Antibiotics:  None  HPI/Subjective: Patient denies any abdominal pain. Patient denies any shortness of breath. No complaints.  Objective: Filed Vitals:   10/01/14 1448  BP: 119/48  Pulse: 93  Temp: 97.3 F (36.3 C)  Resp: 16    Intake/Output Summary (Last 24 hours) at 10/01/14 1750 Last data filed at 10/01/14 1542  Gross per 24 hour  Intake    720 ml  Output   2250 ml  Net  -1530 ml   Filed Weights   09/29/14 1520 09/30/14 0639  Weight: 61.236 kg (135 lb) 60.192 kg (132 lb 11.2 oz)    Exam:   General:  NAD  Cardiovascular: RRR  Respiratory: CTAB  Abdomen: Soft, soft, nontender, nondistended, positive bowel sounds.  Musculoskeletal: No clubbing cyanosis or edema.  Data Reviewed: Basic Metabolic Panel:  Recent Labs Lab 09/29/14 1035 09/30/14 0425 10/01/14 0415  NA 134* 140 142  K 5.3* 4.3 3.9  CL 99* 112* 110  CO2 21* 24 25  GLUCOSE 91 89 95  BUN 99* 69* 42*  CREATININE 5.87* 2.57* 1.41*  CALCIUM 8.3* 7.5* 8.0*   Liver Function Tests: No results for input(s): AST, ALT, ALKPHOS, BILITOT, PROT, ALBUMIN in the last 168 hours. No results for input(s): LIPASE, AMYLASE in the last 168  hours. No results for input(s): AMMONIA in the last 168 hours. CBC:  Recent Labs Lab 09/29/14 1035 09/30/14 0425 10/01/14 0415  WBC 8.1 5.7 6.8  NEUTROABS 6.8  --   --   HGB 9.7* 9.1* 8.9*  HCT 30.3* 27.7*  27.9*  MCV 92.1 92.3 91.8  PLT 206 217 227   Cardiac Enzymes: No results for input(s): CKTOTAL, CKMB, CKMBINDEX, TROPONINI in the last 168 hours. BNP (last 3 results) No results for input(s): BNP in the last 8760 hours.  ProBNP (last 3 results) No results for input(s): PROBNP in the last 8760 hours.  CBG: No results for input(s): GLUCAP in the last 168 hours.  No results found for this or any previous visit (from the past 240 hour(s)).   Studies: No results found.  Scheduled Meds: . [START ON 10/02/2014] cefTRIAXone (ROCEPHIN)  IV  2 g Intravenous Once  . [START ON 10/02/2014] gentamicin  5 mg/kg Intravenous 30 min Pre-Op  . polyethylene glycol  17 g Oral Daily  . senna-docusate  1 tablet Oral QHS  . [START ON 10/02/2014] sodium phosphate  1 enema Rectal Once   Continuous Infusions:   Principal Problem:   Acute urinary retention Active Problems:   Acute renal failure   Hyperkalemia   Anemia of chronic disease   Elevated PSA   Nodular prostate    Time spent: 54 minutes    THOMPSON,DANIEL M.D. [facility] Pager [phone]. If 7PM-7AM, please contact night-coverage at [email or web address], password [email or web address] 10/01/2014, 5:50 PM  LOS: 2 days

## 2014-10-01 NOTE — Care Management Note (Signed)
Case Management Note  Patient Details  Name: Travon Crochet MRN: [id number] Date of Birth: Aug 06, 1917  Subjective/Objective:      81 yo admitted with Acute urinary retention              Action/Plan: From home alone and has [facility] personal care services 2 hours daily M-F.  Expected Discharge Date:   (unknown)               Expected Discharge Plan:  Skilled Nursing Facility  In-House Referral:  Clinical Social Work  Discharge planning Services  CM Consult  Post Acute Care Choice:    Choice offered to:     DME Arranged:    DME Agency:     HH Arranged:    [place] Agency:     Status of Service:  In process, will continue to follow  Medicare Important Message Given:    Date Medicare IM Given:    Medicare IM give by:    Date Additional Medicare IM Given:    Additional Medicare Important Message give by:     If discussed at [place] of Stay Meetings, dates discussed:    Additional Comments: Awaiting PT evaluation for disposition planning. [name], RN 10/01/2014, 2:30 PM

## 2014-10-01 NOTE — Plan of Care (Signed)
Problem: Phase I Progression Outcomes Goal: Voiding-avoid urinary catheter unless indicated Outcome: Adequate for Discharge Patient will be discharged with foley

## 2014-10-01 NOTE — Clinical Social Work Placement (Signed)
   CLINICAL SOCIAL WORK PLACEMENT  NOTE  Date:  10/01/2014  Patient Details  Name: Anthony Nicholson MRN: [id number] Date of Birth: Aug 12, 1917  Clinical Social Work is seeking post-discharge placement for this patient at the [place] level of care (*CSW will initial, date and re-position this form in  chart as items are completed):  Yes   Patient/family provided with [place] Work Department's list of facilities offering this level of care within the geographic area requested by the patient (or if unable, by the patient's family).  Yes   Patient/family informed of their freedom to choose among providers that offer the needed level of care, that participate in Medicare, Medicaid or managed care program needed by the patient, have an available bed and are willing to accept the patient.  Yes   Patient/family informed of [place]'s ownership interest in [facility] and [facility], as well as of the fact that they are under no obligation to receive care at these facilities.  PASRR submitted to EDS on 10/01/14     PASRR number received on 10/01/14     Existing PASRR number confirmed on       FL2 transmitted to all facilities in geographic area requested by pt/family on 10/01/14     FL2 transmitted to all facilities within larger geographic area on       Patient informed that his/her managed care company has contracts with or will negotiate with certain facilities, including the following:            Patient/family informed of bed offers received.  Patient chooses bed at       Physician recommends and patient chooses bed at      Patient to be transferred to   on  .  Patient to be transferred to facility by       Patient family notified on   of transfer.  Name of family member notified:        PHYSICIAN Please sign FL2     Additional Comment:    _______________________________________________ Ladell Pier, LCSW 10/01/2014,  10:53 AM

## 2014-10-01 NOTE — Progress Notes (Signed)
Patient ID: Anthony Nicholson, male   DOB: 1917/04/23, 81 y.o.   MRN: [id number]    Subjective: Anthony Nicholson is doing well today.  He is stiff from being in the bed but has no other complaints.   His Cr continues to decline and his urine remains clear.  ROS:  Review of Systems  All other systems reviewed and are negative.   Anti-infectives: Anti-infectives    None      Current Facility-Administered Medications  Medication Dose Route Frequency Provider Last Rate Last Dose  . acetaminophen (TYLENOL) tablet 650 mg  650 mg Oral Q6H PRN Gardiner Barefoot, NP   650 mg at 09/29/14 2134  . aspirin EC tablet 81 mg  81 mg Oral Daily Theodis Blaze, MD   81 mg at 09/30/14 1015  . calcium carbonate (TUMS - dosed in mg elemental calcium) chewable tablet 200 mg of elemental calcium  1 tablet Oral PRN Theodis Blaze, MD   200 mg of elemental calcium at 09/29/14 1742  . MUSCLE RUB CREA   Topical PRN Gardiner Barefoot, NP      . ondansetron [facility]) tablet 4 mg  4 mg Oral Q6H PRN Theodis Blaze, MD       Or  . ondansetron [facility]) injection 4 mg  4 mg Intravenous Q6H PRN Theodis Blaze, MD         Objective: Vital signs in last 24 hours: Temp:  [98 F (36.7 C)] 98 F (36.7 C) (06/15 0444) Pulse Rate:  [70-83] 70 (06/15 0444) Resp:  [16] 16 (06/15 0444) BP: (124-135)/(49-65) 124/65 mmHg (06/15 0444) SpO2:  [98 %-100 %] 98 % (06/15 0444)  Intake/Output from previous day: 06/14 0701 - 06/15 0700 In: 760 [P.O.:760] Out: 2200 [Urine:2200] Intake/Output this shift:     Physical Exam  Constitutional: He is well-developed, well-nourished, and in no distress.  Genitourinary:  Urine is clear    Lab Results:   Recent Labs  09/30/14 0425 10/01/14 0415  WBC 5.7 6.8  HGB 9.1* 8.9*  HCT 27.7* 27.9*  PLT 217 227   BMET  Recent Labs  09/30/14 0425 10/01/14 0415  NA 140 142  K 4.3 3.9  CL 112* 110  CO2 24 25  GLUCOSE 89 95  BUN 69* 42*  CREATININE 2.57* 1.41*  CALCIUM 7.5* 8.0*    PT/INR No results for input(s): LABPROT, INR in the last 72 hours. ABG No results for input(s): PHART, HCO3 in the last 72 hours.  Invalid input(s): PCO2, PO2  Studies/Results: US Renal  09/29/2014   CLINICAL DATA:  Renal failure  EXAM: RENAL / URINARY TRACT ULTRASOUND COMPLETE  COMPARISON:  None.  FINDINGS: Right Kidney:  Length: 9 cm from generalized atrophy. Mild hydronephrosis, accentuated by an extrarenal pelvis. No masses. Normal cortical echogenicity  Left Kidney:  Length: 9 cm from generalized atrophy. Mild hydronephrosis. Normal cortical echogenicity. No masses.  Bladder:  Moderately distended, despite indwelling Foley. The wall is circumferentially thickened with trabeculated appearance posteriorly.  IMPRESSION: 1. Mild bilateral hydronephrosis, greater on the right. 2. Foley catheter with partial drainage of the bladder. 3. Bladder wall thickening, often from chronic outlet obstruction.   Electronically Signed   By: Monte Fantasia M.D.   On: 09/29/2014 14:24     Assessment: Acute retention with ARI continues to improve. Nodular prostate with elevated PSA.   Plan: I have discussed the elevated PSA and need for prostate biopsy with his son-in-law and I am going to try  to get this set up for tomorrow.  I will do cystoscopy as well.     I have reviewed the risks of bleeding and infection. I am going to hold the 81mg  ASA but the added risk of bleeding from the biopsy with that is low.   CC: Dr. Mart Piggs.    LOS: 2 days    Malka So 10/01/2014

## 2014-10-01 NOTE — Evaluation (Signed)
Physical Therapy Evaluation Patient Details Name: Anthony Nicholson MRN: [id number] DOB: Dec 04, 1917 Today's Date: 10/01/2014   History of Present Illness  Pt is a 81 year old male admitted for acute retention with ARI and elevated PSA  Clinical Impression  Pt admitted with above diagnosis. Pt currently with functional limitations due to the deficits listed below (see PT Problem List).  Pt will benefit from skilled PT to increase their independence and safety with mobility to allow discharge to the venue listed below.   Pt reports no recent hx of falls.  Pt and family agreeable to SNF upon d/c.  Pt with planned procedure tomorrow and would benefit from SNF for safety and more assist upon d/c.     Follow Up Recommendations SNF    Equipment Recommendations  None recommended by PT    Recommendations for Other Services       Precautions / Restrictions Precautions Precautions: Fall      Mobility  Bed Mobility Overal bed mobility: Needs Assistance Bed Mobility: Supine to Sit     Supine to sit: Min assist     General bed mobility comments: assist for trunk upright  Transfers Overall transfer level: Needs assistance Equipment used: Rolling walker (2 wheeled) Transfers: Sit to/from Stand Sit to Stand: Min assist         General transfer comment: assist to rise and steady  Ambulation/Gait Ambulation/Gait assistance: Min assist Ambulation Distance (Feet): 120 Feet Assistive device: Rolling walker (2 wheeled) Gait Pattern/deviations: Step-through pattern;Narrow base of support;Drifts right/left;Shuffle;Decreased stride length     General Gait Details: pt required occasional assist to steady  Stairs            Wheelchair Mobility    Modified Rankin (Stroke Patients Only)       Balance                                             Pertinent Vitals/Pain Pain Assessment: No/denies pain    Home Living Family/patient expects to be discharged  to:: Skilled nursing facility Living Arrangements: Alone   Type of Home: Independent living facility         Home Equipment: Walker - 4 wheels      Prior Function Level of Independence: Needs assistance   Gait / Transfers Assistance Needed: modified independent with 4 wheeled RW  ADL's / Homemaking Assistance Needed: family reports caregiver 2 hrs/day 5 days/week for ADLs        Hand Dominance        Extremity/Trunk Assessment               Lower Extremity Assessment: Generalized weakness         Communication   Communication: HOH  Cognition Arousal/Alertness: Awake/alert Behavior During Therapy: WFL for tasks assessed/performed Overall Cognitive Status: Difficult to assess (overall appropriate)                      General Comments      Exercises        Assessment/Plan    PT Assessment Patient needs continued PT services  PT Diagnosis Difficulty walking   PT Problem List Decreased strength;Decreased activity tolerance;Decreased mobility  PT Treatment Interventions DME instruction;Gait training;Functional mobility training;Patient/family education;Therapeutic activities;Therapeutic exercise   PT Goals (Current goals can be found in the Care Plan section) Acute Rehab PT Goals PT Goal Formulation:  With patient/family Time For Goal Achievement: 10/08/14 Potential to Achieve Goals: Good    Frequency Min 3X/week   Barriers to discharge        Co-evaluation               End of Session Equipment Utilized During Treatment: Gait belt Activity Tolerance: Patient tolerated treatment well Patient left: in chair;with call bell/phone within reach;with chair alarm set;with family/visitor present           Time: 3212-2482 PT Time Calculation (min) (ACUTE ONLY): 18 min   Charges:   PT Evaluation $Initial PT Evaluation Tier I: 1 Procedure     PT G Codes:        Stevon Gough,KATHrine E 10/01/2014, 3:39 PM Carmelia Bake, PT,  DPT 10/01/2014 Pager: [phone]

## 2014-10-01 NOTE — Clinical Social Work Note (Signed)
Clinical Social Work Assessment  Patient Details  Name: Anthony Nicholson MRN: [id number] Date of Birth: Apr 14, 1918  Date of referral:  10/01/14               Reason for consult:  Discharge Planning                Permission sought to share information with:  Family Supports Permission granted to share information::  Yes, Verbal Permission Granted  Name::     Louann Sjogren  Agency::     Relationship::  son-in-law  Contact Information:  [phone]  Housing/Transportation Living arrangements for the past 2 months:  Apartment Source of Information:  Patient, Adult Children Patient Interpreter Needed:  None Criminal Activity/Legal Involvement Pertinent to Current Situation/Hospitalization:  No - Comment as needed Significant Relationships:  Adult Children Lives with:  Self Do you feel safe going back to the place where you live?  No Need for family participation in patient care:  Yes (Comment) (pt son-in-law at bedside)  Care giving concerns:  Pt lives alone in an independent living apartment.    Social Worker assessment / plan: CSW received notification that pt son-in-law, Lucianne Lei at bedside and requesting to speak with CSW and RNCM.   CSW and RNCM met with pt and pt son, Lucianne Lei at bedside. CSW introduced self and explained role. Pt son-in-law discussed that pt lives in an independent living facility apartment. Pt son expressed concern about pt returning to independent living apartment as pt lives alone and only has caregivers 2 hours daily. Pt son is greatly concerned about pt returning to independent living due to pt risk for falling and feels that pt will need short term rehab at SNF prior to returning to independent living. CSW discussed with pt and pt son-in-law that PT is scheduled to see pt today and will provide their recommendation for disposition. As long as PT recommends SNF for rehab then CSW can assist pt and pt family in exploring options for short term rehab placement. Pt and pt  son-in-law expressed understanding. CSW and RNCM provided clarification to pt and pt son-in-law regarding the differences in short term rehab at [facility] and home health services. Pt and pt son-in-law agree short term rehab will be best plan prior to pt returning to apartment as long as pt qualifies.   CSW completed FL2 and awaiting PT evaluation in order to complete FL2 and initiate SNF search to [facility] if appropriate.  CSW to continue to follow to provide support and assist with pt disposition needs.   Employment status:  Retired Nurse, adult PT Recommendations:  Not assessed at this time Information / Referral to community resources:  [place]  Patient/Family's Response to care:  Pt alert and oriented x 4. Pt recognizes that he will likely need short term rehab prior to returning home. Pt expressed that it may initially be a difficult transition as pt has been fairly independent prior to admission. Pt family feel that pt will benefit from short term rehab as they are hopeful to prevent pt from having a fall at home.   Patient/Family's Understanding of and Emotional Response to Diagnosis, Current Treatment, and Prognosis:  Pt son-in-law has a very good understanding about pt diagnosis and treatment plan. Pt son-in-law is very involved in pt care and expressed wanting to ensure all aspects of pt care are being addressed including planning for disposition.   Emotional Assessment Appearance:  Appears younger than stated age Attitude/Demeanor/Rapport:  Other (  pt pleasant and appropriate) Affect (typically observed):  Accepting, Pleasant Orientation:  Oriented to Self, Oriented to Place, Oriented to  Time, Oriented to Situation Alcohol / Substance use:  Not Applicable Psych involvement (Current and /or in the community):  No (Comment)  Discharge Needs  Concerns to be addressed:  Discharge Planning Concerns Readmission within the last 30 days:   No Current discharge risk:  Lives alone Barriers to Discharge:  Continued Medical Work up   Alison Murray A, LCSW 10/01/2014, 10:39 AM  [phone]

## 2014-10-02 ENCOUNTER — Encounter (HOSPITAL_COMMUNITY)
Admission: EM | Disposition: A | Discharge status: Skilled Nursing Facility | Payer: Self-pay | Source: Home / Self Care | Attending: Internal Medicine

## 2014-10-02 ENCOUNTER — Encounter (HOSPITAL_COMMUNITY): Payer: Self-pay | Admitting: Certified Registered Nurse Anesthetist

## 2014-10-02 ENCOUNTER — Inpatient Hospital Stay (HOSPITAL_COMMUNITY): Payer: Non-veteran care | Admitting: Certified Registered Nurse Anesthetist

## 2014-10-02 DIAGNOSIS — D638 Anemia in other chronic diseases classified elsewhere: Secondary | ICD-10-CM | POA: Diagnosis not present

## 2014-10-02 DIAGNOSIS — C61 Malignant neoplasm of prostate: Secondary | ICD-10-CM | POA: Diagnosis present

## 2014-10-02 DIAGNOSIS — N401 Enlarged prostate with lower urinary tract symptoms: Secondary | ICD-10-CM | POA: Diagnosis present

## 2014-10-02 DIAGNOSIS — Z85828 Personal history of other malignant neoplasm of skin: Secondary | ICD-10-CM | POA: Diagnosis not present

## 2014-10-02 DIAGNOSIS — R972 Elevated prostate specific antigen [PSA]: Secondary | ICD-10-CM | POA: Diagnosis not present

## 2014-10-02 DIAGNOSIS — N179 Acute kidney failure, unspecified: Secondary | ICD-10-CM | POA: Diagnosis not present

## 2014-10-02 DIAGNOSIS — E875 Hyperkalemia: Secondary | ICD-10-CM | POA: Diagnosis present

## 2014-10-02 DIAGNOSIS — R300 Dysuria: Secondary | ICD-10-CM | POA: Diagnosis present

## 2014-10-02 DIAGNOSIS — E871 Hypo-osmolality and hyponatremia: Secondary | ICD-10-CM | POA: Diagnosis present

## 2014-10-02 DIAGNOSIS — N1330 Unspecified hydronephrosis: Secondary | ICD-10-CM | POA: Diagnosis present

## 2014-10-02 DIAGNOSIS — R338 Other retention of urine: Secondary | ICD-10-CM | POA: Diagnosis not present

## 2014-10-02 HISTORY — PX: CYSTOSCOPY: SHX5120

## 2014-10-02 HISTORY — PX: PROSTATE BIOPSY: SHX241

## 2014-10-02 LAB — CBC
HEMATOCRIT: 29.4 % — AB (ref 39.0–52.0)
HEMOGLOBIN: 9.4 g/dL — AB (ref 13.0–17.0)
MCH: 29.5 pg (ref 26.0–34.0)
MCHC: 32 g/dL (ref 30.0–36.0)
MCV: 92.2 fL (ref 78.0–100.0)
PLATELETS: 249 10*3/uL (ref 150–400)
RBC: 3.19 MIL/uL — ABNORMAL LOW (ref 4.22–5.81)
RDW: 13.5 % (ref 11.5–15.5)
WBC: 6 10*3/uL (ref 4.0–10.5)

## 2014-10-02 LAB — BASIC METABOLIC PANEL
Anion gap: 6 (ref 5–15)
BUN: 30 mg/dL — ABNORMAL HIGH (ref 6–20)
CALCIUM: 8.2 mg/dL — AB (ref 8.9–10.3)
CO2: 28 mmol/L (ref 22–32)
CREATININE: 1.17 mg/dL (ref 0.61–1.24)
Chloride: 106 mmol/L (ref 101–111)
GFR calc Af Amer: 60 mL/min — ABNORMAL LOW (ref >60)
GFR calc non Af Amer: 51 mL/min — ABNORMAL LOW (ref >60)
GLUCOSE: 94 mg/dL (ref 65–99)
Potassium: 4.3 mmol/L (ref 3.5–5.1)
Sodium: 140 mmol/L (ref 135–145)

## 2014-10-02 SURGERY — BIOPSY, PROSTATE
Anesthesia: Choice

## 2014-10-02 MED ORDER — FENTANYL CITRATE (PF) 100 MCG/2ML IJ SOLN
INTRAMUSCULAR | Status: DC | PRN
  Administered 2014-10-02: 25 ug via INTRAVENOUS

## 2014-10-02 MED ORDER — LACTATED RINGERS IV SOLN
INTRAVENOUS | Status: DC | PRN
  Administered 2014-10-02: 12:00:00 via INTRAVENOUS

## 2014-10-02 MED ORDER — LACTATED RINGERS IV SOLN
INTRAVENOUS | Status: DC
  Administered 2014-10-02: 1000 mL via INTRAVENOUS

## 2014-10-02 MED ORDER — SODIUM CHLORIDE 0.9 % IR SOLN
Status: DC | PRN
  Administered 2014-10-02: 3000 mL via INTRAVESICAL

## 2014-10-02 MED ORDER — PHENYLEPHRINE HCL 10 MG/ML IJ SOLN
INTRAMUSCULAR | Status: DC | PRN
  Administered 2014-10-02 (×3): 80 ug via INTRAVENOUS

## 2014-10-02 MED ORDER — LIDOCAINE HCL 2 % IJ SOLN
INTRAMUSCULAR | Status: AC
  Filled 2014-10-02: qty 20

## 2014-10-02 MED ORDER — SODIUM CHLORIDE 0.9 % IV SOLN
INTRAVENOUS | Status: DC
  Administered 2014-10-03: 1000 mL via INTRAVENOUS

## 2014-10-02 MED ORDER — FENTANYL CITRATE (PF) 100 MCG/2ML IJ SOLN
INTRAMUSCULAR | Status: AC
  Filled 2014-10-02: qty 2

## 2014-10-02 MED ORDER — LIDOCAINE HCL (CARDIAC) 20 MG/ML IV SOLN
INTRAVENOUS | Status: DC | PRN
  Administered 2014-10-02: 50 mg via INTRAVENOUS

## 2014-10-02 MED ORDER — PROPOFOL 10 MG/ML IV BOLUS
INTRAVENOUS | Status: DC | PRN
  Administered 2014-10-02: 100 mg via INTRAVENOUS

## 2014-10-02 MED ORDER — LIDOCAINE HCL (CARDIAC) 20 MG/ML IV SOLN
INTRAVENOUS | Status: AC
  Filled 2014-10-02: qty 5

## 2014-10-02 MED ORDER — PROPOFOL 10 MG/ML IV BOLUS
INTRAVENOUS | Status: AC
  Filled 2014-10-02: qty 20

## 2014-10-02 SURGICAL SUPPLY — 15 items
BAG URINE DRAINAGE (UROLOGICAL SUPPLIES) ×3 IMPLANT
BAG URO CATCHER STRL LF (DRAPE) ×3 IMPLANT
CATH FOLEY 2WAY SLVR  5CC 18FR (CATHETERS) ×2
CATH FOLEY 2WAY SLVR 5CC 18FR (CATHETERS) ×1 IMPLANT
CATH ROBINSON RED A/P 16FR (CATHETERS) IMPLANT
CATH URET 5FR 28IN OPEN ENDED (CATHETERS) IMPLANT
CLOTH BEACON ORANGE TIMEOUT ST (SAFETY) ×3 IMPLANT
GLOVE SURG SS PI 8.0 STRL IVOR (GLOVE) ×3 IMPLANT
GOWN STRL REUS W/TWL XL LVL3 (GOWN DISPOSABLE) ×3 IMPLANT
MANIFOLD NEPTUNE II (INSTRUMENTS) ×3 IMPLANT
PACK CYSTO (CUSTOM PROCEDURE TRAY) ×3 IMPLANT
SYR CONTROL 10ML LL (SYRINGE) IMPLANT
TUBING CONNECTING 10 (TUBING) ×2 IMPLANT
TUBING CONNECTING 10' (TUBING) ×1
UNDERPAD 30X30 INCONTINENT (UNDERPADS AND DIAPERS) ×3 IMPLANT

## 2014-10-02 NOTE — H&P (View-Only) (Signed)
Patient ID: Anthony Nicholson, male   DOB: [birth year], 81 y.o.   MRN: [id number]    Subjective: Anthony Nicholson is doing well today.  He is stiff from being in the bed but has no other complaints.   His Cr continues to decline and his urine remains clear.  ROS:  Review of Systems  All other systems reviewed and are negative.   Anti-infectives: Anti-infectives    None      Current Facility-Administered Medications  Medication Dose Route Frequency Provider Last Rate Last Dose  . acetaminophen (TYLENOL) tablet 650 mg  650 mg Oral Q6H PRN Gardiner Barefoot, NP   650 mg at 09/29/14 2134  . aspirin EC tablet 81 mg  81 mg Oral Daily Theodis Blaze, MD   81 mg at 09/30/14 1015  . calcium carbonate (TUMS - dosed in mg elemental calcium) chewable tablet 200 mg of elemental calcium  1 tablet Oral PRN Theodis Blaze, MD   200 mg of elemental calcium at 09/29/14 1742  . MUSCLE RUB CREA   Topical PRN Gardiner Barefoot, NP      . ondansetron [facility]) tablet 4 mg  4 mg Oral Q6H PRN Theodis Blaze, MD       Or  . ondansetron [facility]) injection 4 mg  4 mg Intravenous Q6H PRN Theodis Blaze, MD         Objective: Vital signs in last 24 hours: Temp:  [98 F (36.7 C)] 98 F (36.7 C) (06/15 0444) Pulse Rate:  [70-83] 70 (06/15 0444) Resp:  [16] 16 (06/15 0444) BP: (124-135)/(49-65) 124/65 mmHg (06/15 0444) SpO2:  [98 %-100 %] 98 % (06/15 0444)  Intake/Output from previous day: 06/14 0701 - 06/15 0700 In: 760 [P.O.:760] Out: 2200 [Urine:2200] Intake/Output this shift:     Physical Exam  Constitutional: He is well-developed, well-nourished, and in no distress.  Genitourinary:  Urine is clear    Lab Results:   Recent Labs  09/30/14 0425 10/01/14 0415  WBC 5.7 6.8  HGB 9.1* 8.9*  HCT 27.7* 27.9*  PLT 217 227   BMET  Recent Labs  09/30/14 0425 10/01/14 0415  NA 140 142  K 4.3 3.9  CL 112* 110  CO2 24 25  GLUCOSE 89 95  BUN 69* 42*  CREATININE 2.57* 1.41*  CALCIUM 7.5* 8.0*    PT/INR No results for input(s): LABPROT, INR in the last 72 hours. ABG No results for input(s): PHART, HCO3 in the last 72 hours.  Invalid input(s): PCO2, PO2  Studies/Results: US Renal  09/29/2014   CLINICAL DATA:  Renal failure  EXAM: RENAL / URINARY TRACT ULTRASOUND COMPLETE  COMPARISON:  None.  FINDINGS: Right Kidney:  Length: 9 cm from generalized atrophy. Mild hydronephrosis, accentuated by an extrarenal pelvis. No masses. Normal cortical echogenicity  Left Kidney:  Length: 9 cm from generalized atrophy. Mild hydronephrosis. Normal cortical echogenicity. No masses.  Bladder:  Moderately distended, despite indwelling Foley. The wall is circumferentially thickened with trabeculated appearance posteriorly.  IMPRESSION: 1. Mild bilateral hydronephrosis, greater on the right. 2. Foley catheter with partial drainage of the bladder. 3. Bladder wall thickening, often from chronic outlet obstruction.   Electronically Signed   By: Monte Fantasia M.D.   On: 09/29/2014 14:24     Assessment: Acute retention with ARI continues to improve. Nodular prostate with elevated PSA.   Plan: I have discussed the elevated PSA and need for prostate biopsy with his son-in-law and I am going to try  to get this set up for tomorrow.  I will do cystoscopy as well.     I have reviewed the risks of bleeding and infection. I am going to hold the 81mg  ASA but the added risk of bleeding from the biopsy with that is low.   CC: Dr. Mart Piggs.    LOS: 2 days    Malka So 10/01/2014

## 2014-10-02 NOTE — Brief Op Note (Signed)
09/29/2014 - 10/02/2014  12:56 PM  PATIENT:  Anthony Nicholson  81 y.o. male  PRE-OPERATIVE DIAGNOSIS:  ELEVATED PSA NODULAR PROSTATE WITH RETENTION   POST-OPERATIVE DIAGNOSIS:  ELEVATED PSA NODULAR PROSTATE WITH RETENTION  PROCEDURE:  Procedure(s): PROSTATE ULTRASOUND AND BIOPSY  (N/A) FLEXIBLE CYSTOSCOPY (N/A)  SURGEON:  Surgeon(s) and Role:    * Irine Seal, MD - Primary  PHYSICIAN ASSISTANT:   ASSISTANTS: none   ANESTHESIA:   general  EBL:  Total I/O In: -  Out: 400 [Urine:400]  BLOOD ADMINISTERED:none  DRAINS: Urinary Catheter (Foley)   LOCAL MEDICATIONS USED:  NONE  SPECIMEN:  Source of Specimen:  7 prostate biopsy cores  DISPOSITION OF SPECIMEN:  PATHOLOGY  COUNTS:  YES  TOURNIQUET:  * No tourniquets in log *  DICTATION: .Other Dictation: Dictation Number [id number]  PLAN OF CARE: Admit to inpatient   PATIENT DISPOSITION:  PACU - hemodynamically stable.   Delay start of Pharmacological VTE agent (>24hrs) due to surgical blood loss or risk of bleeding: not applicable

## 2014-10-02 NOTE — Interval H&P Note (Signed)
History and Physical Interval Note:  Anthony Nicholson is for prostate biopsy and cystoscopy today.  10/02/2014 11:33 AM  Anthony Nicholson  has presented today for surgery, with the diagnosis of ELEVATED PSA NODULAR PROSTATE WITH RETENTION   The various methods of treatment have been discussed with the patient and family. After consideration of risks, benefits and other options for treatment, the patient has consented to  Procedure(s): PROSTATE ULTRASOUND AND BIOPSY  (N/A) FLEXIBLE CYSTOSCOPY (N/A) as a surgical intervention .  The patient's history has been reviewed, patient examined, no change in status, stable for surgery.  I have reviewed the patient's chart and labs.  Questions were answered to the patient's satisfaction.     Melvin Whiteford J

## 2014-10-02 NOTE — Progress Notes (Signed)
CSW continuing to follow.   CSW met with pt daughter and pt son-in-law in conference room to discuss SNF bed offers. Pt current in procedure at this time.   CSW provided list of SNF bed offers to pt daughter and pt son-in-law. CSW clarified questions and concerns of pt family. Pt family had list of facilities in which they were interested. Pt family chooses [facility] as facility has private room availability. CSW discussed with pt and pt family that pt family will likely have to update facility regarding biopsy results and depending on biopsy results and if there is recommendation for treatment then pt may have to complete rehab before pursuing treatment if biopsy results indicate need. Pt family expressed understanding regarding this.   CSW contacted [facility] and notified facility that pt family accepting of bed offer. [facility] confirmed that facility can offer private room when pt medically stable for discharge.   CSW to continue to follow to provide support and assist with pt discharge to [facility] when medically ready for discharge.  Alison Murray, MSW, [place] Work [phone]

## 2014-10-02 NOTE — Progress Notes (Signed)
[facility] PROGRESS NOTE  Jerzy Roepke XTG:[id number] DOB: May 22, 1917 DOA: 09/29/2014 PCP: Serita Grammes, CNM  Brief interval history Patient is a 81 year old male, fairly independent and healthy at baseline but with known history of basal cell carcinoma in remission, presented to [facility] emergency department with main concern of 3 days duration of trouble urinating. He explains he initially thought he was having difficulty with voiding due to constipation and has tried several laxative OTC which offered no relief. He reports somewhat similar problem in the past and self resolving, reports had PSA checked this year and was within normal limits. He denies fevers and chills, no specific abdominal concerns except pressure in the lower abdominal area. He denies nausea or vomiting, no chest pain, no shortness of breath.  In emergency department, patient noted to be hemodynamically stable, VSS, blood work notable for potassium 5.3, CR 5.87, PVR checked > 400 cc in bladder. Foley placed and TRH asked to admit for further evaluation and management.     Assessment/Plan: #1 acute urinary retention with obstructive uropathy/hydronephrosis and acute renal failure Questionable etiology. Patient with elevated PSA with concerns for prostate cancer. Foley catheter with good drainage. Renal function trending down. Will need to continue Foley catheter for the next 1-2 weeks prior to a voiding trial. Continue Flomax. Patient has been seen in consultation by urology who are recommending a prostate biopsy and cystoscopy to be done today. Urology following and appreciate input and recommendations.  #2 acute renal failure Secondary to obstructive uropathy. Renal function improving post Foley catheter placement. Follow.  #3 nodular prostate with elevated PSA  Patient has been seen in consultation by urology who are recommending a prostate biopsy as well as cystoscopy to be done today. Per urology.  #4  hyperkalemia Secondary to acute renal failure. Resolved.  #5 anemia of chronic disease No signs of active bleeding. Hemoglobin currently at 9.4 from 8.9 from 9.1 yesterday. Follow H&H.  #6 hyponatremia Likely secondary to hypovolemic hyponatremia. Resolved.  #7 prophylaxis SCDs for DVT prophylaxis.  Code Status: Full Family Communication: Updated daughter and son-in-law at bedside. Disposition Plan: To skilled nursing facility when medically stable.   Consultants:  Urology: Dr.Wrenn 09/29/2014  Procedures:  Renal ultrasound 09/29/2014  Antibiotics:  None  HPI/Subjective: Patient denies chest pain. No SOB. No abdominal pain.  Objective: Filed Vitals:   10/01/14 2130  BP: 136/61  Pulse: 82  Temp: 97.8 F (36.6 C)  Resp: 16    Intake/Output Summary (Last 24 hours) at 10/02/14 1047 Last data filed at 10/02/14 1000  Gross per 24 hour  Intake 1331.25 ml  Output   1300 ml  Net  31.25 ml   Filed Weights   09/29/14 1520 09/30/14 0639  Weight: 61.236 kg (135 lb) 60.192 kg (132 lb 11.2 oz)    Exam:   General:  NAD  Cardiovascular: RRR  Respiratory: CTAB  Abdomen: Soft, soft, nontender, nondistended, positive bowel sounds.  Musculoskeletal: No clubbing cyanosis or edema.  Data Reviewed: Basic Metabolic Panel:  Recent Labs Lab 09/29/14 1035 09/30/14 0425 10/01/14 0415 10/02/14 0412  NA 134* 140 142 140  K 5.3* 4.3 3.9 4.3  CL 99* 112* 110 106  CO2 21* 24 25 28   GLUCOSE 91 89 95 94  BUN 99* 69* 42* 30*  CREATININE 5.87* 2.57* 1.41* 1.17  CALCIUM 8.3* 7.5* 8.0* 8.2*   Liver Function Tests: No results for input(s): AST, ALT, ALKPHOS, BILITOT, PROT, ALBUMIN in the last 168 hours. No results for input(s): LIPASE,  AMYLASE in the last 168 hours. No results for input(s): AMMONIA in the last 168 hours. CBC:  Recent Labs Lab 09/29/14 1035 09/30/14 0425 10/01/14 0415 10/02/14 0412  WBC 8.1 5.7 6.8 6.0  NEUTROABS 6.8  --   --   --   HGB 9.7*  9.1* 8.9* 9.4*  HCT 30.3* 27.7* 27.9* 29.4*  MCV 92.1 92.3 91.8 92.2  PLT 206 217 227 249   Cardiac Enzymes: No results for input(s): CKTOTAL, CKMB, CKMBINDEX, TROPONINI in the last 168 hours. BNP (last 3 results) No results for input(s): BNP in the last 8760 hours.  ProBNP (last 3 results) No results for input(s): PROBNP in the last 8760 hours.  CBG: No results for input(s): GLUCAP in the last 168 hours.  Recent Results (from the past 240 hour(s))  Surgical pcr screen     Status: None   Collection Time: 10/01/14  9:12 PM  Result Value Ref Range Status   MRSA, PCR NEGATIVE NEGATIVE Final   Staphylococcus aureus NEGATIVE NEGATIVE Final    Comment:        The Xpert SA Assay (FDA approved for NASAL specimens in patients over 72 years of age), is one component of a comprehensive surveillance program.  Test performance has been validated by [facility] for patients greater than or equal to 23 year old. It is not intended to diagnose infection nor to guide or monitor treatment.      Studies: No results found.  Scheduled Meds: . gentamicin  5 mg/kg Intravenous 30 min Pre-Op  . polyethylene glycol  17 g Oral Daily  . senna-docusate  1 tablet Oral QHS   Continuous Infusions: . sodium chloride 75 mL/hr at 10/02/14 1037    Principal Problem:   Acute urinary retention Active Problems:   Acute renal failure   Hyperkalemia   Anemia of chronic disease   Elevated PSA   Nodular prostate   Urinary retention    Time spent: 29 minutes    THOMPSON,DANIEL M.D. [facility] Pager [phone]. If 7PM-7AM, please contact night-coverage at [email or web address], password [facility] 10/02/2014, 10:47 AM  LOS: 3 days

## 2014-10-02 NOTE — Anesthesia Preprocedure Evaluation (Addendum)
Anesthesia Evaluation  Patient identified by MRN, date of birth, ID band Patient awake    Reviewed: Allergy & Precautions, NPO status , Patient's Chart, lab work & pertinent test results  History of Anesthesia Complications Negative for: history of anesthetic complications  Airway Mallampati: II  TM Distance: >3 FB Neck ROM: Full    Dental  (+) Partial Upper, Missing, Dental Advisory Given   Pulmonary  breath sounds clear to auscultation        Cardiovascular - anginanegative cardio ROS  Rhythm:Regular Rate:Normal     Neuro/Psych negative neurological ROS     GI/Hepatic negative GI ROS, Neg liver ROS,   Endo/Other  negative endocrine ROS  Renal/GU Renal disease     Musculoskeletal   Abdominal   Peds  Hematology  (+) Blood dyscrasia (Hb 9.4), ,   Anesthesia Other Findings   Reproductive/Obstetrics                            Anesthesia Physical Anesthesia Plan  ASA: III  Anesthesia Plan: General   Post-op Pain Management:    Induction: Intravenous  Airway Management Planned: LMA  Additional Equipment:   Intra-op Plan:   Post-operative Plan:   Informed Consent: I have reviewed the patients History and Physical, chart, labs and discussed the procedure including the risks, benefits and alternatives for the proposed anesthesia with the patient or authorized representative who has indicated his/her understanding and acceptance.   Dental advisory given  Plan Discussed with: CRNA and Surgeon  Anesthesia Plan Comments: (Plan routine monitors GA- LMA OK)        Anesthesia Quick Evaluation

## 2014-10-02 NOTE — Anesthesia Postprocedure Evaluation (Signed)
  Anesthesia Post-op Note  Patient: Anthony Nicholson  Procedure(s) Performed: Procedure(s): PROSTATE ULTRASOUND AND BIOPSY  (N/A) FLEXIBLE CYSTOSCOPY (N/A)  Patient Location: PACU  Anesthesia Type:General  Level of Consciousness: oriented, sedated, patient cooperative and responds to stimulation  Airway and Oxygen Therapy: Patient Spontanous Breathing  Post-op Pain: none  Post-op Assessment: Post-op Vital signs reviewed, Patient's Cardiovascular Status Stable, Respiratory Function Stable, Patent Airway, No signs of Nausea or vomiting and Adequate PO intake              Post-op Vital Signs: Reviewed and stable  Last Vitals:  Filed Vitals:   10/02/14 1330  BP:   Pulse: 73  Temp:   Resp: 17    Complications: No apparent anesthesia complications

## 2014-10-02 NOTE — Anesthesia Procedure Notes (Signed)
Procedure Name: LMA Insertion Date/Time: 10/02/2014 12:26 PM Performed by: [place] Regena Delucchi C Pre-anesthesia Checklist: Patient identified, Emergency Drugs available, Patient being monitored, Suction available and Timeout performed Patient Re-evaluated:Patient Re-evaluated prior to inductionOxygen Delivery Method: Circle system utilized Preoxygenation: Pre-oxygenation with 100% oxygen Intubation Type: IV induction LMA: LMA inserted LMA Size: 4.0 Number of attempts: 1 Placement Confirmation: positive ETCO2,  CO2 detector and breath sounds checked- equal and bilateral Tube secured with: Tape Dental Injury: Teeth and Oropharynx as per pre-operative assessment

## 2014-10-02 NOTE — Progress Notes (Signed)
Patient ID: Anthony Nicholson, male   DOB: Jun 18, 1917, 81 y.o.   MRN: [id number]   He is doing well post op without complaints or fever.  I have a very high suspicion for prostate cancer based on the PSA and [place] results.   I have ordered a bonescan and CT pelvis for staging purposes and have asked for the path to be rushed.  He will need treatment with androgen ablation if the biopsy returns positive.  I would be nice to get in the initial dose of firmagon prior to discharge but that depends on when the biopsy comes back.

## 2014-10-02 NOTE — Transfer of Care (Signed)
Immediate Anesthesia Transfer of Care Note  Patient: Anthony Nicholson  Procedure(s) Performed: Procedure(s): PROSTATE ULTRASOUND AND BIOPSY  (N/A) FLEXIBLE CYSTOSCOPY (N/A)  Patient Location: PACU  Anesthesia Type:General  Level of Consciousness: awake, alert  and oriented  Airway & Oxygen Therapy: Patient Spontanous Breathing and Patient connected to face mask oxygen  Post-op Assessment: Report given to RN and Post -op Vital signs reviewed and stable  Post vital signs: Reviewed and stable  Last Vitals:  Filed Vitals:   10/02/14 1309  BP:   Pulse: 71  Temp:   Resp: 13    Complications: No apparent anesthesia complications

## 2014-10-03 ENCOUNTER — Inpatient Hospital Stay (HOSPITAL_COMMUNITY): Payer: Non-veteran care

## 2014-10-03 ENCOUNTER — Encounter (HOSPITAL_COMMUNITY): Payer: Self-pay | Admitting: Radiology

## 2014-10-03 DIAGNOSIS — R338 Other retention of urine: Secondary | ICD-10-CM | POA: Diagnosis not present

## 2014-10-03 DIAGNOSIS — C61 Malignant neoplasm of prostate: Secondary | ICD-10-CM | POA: Diagnosis not present

## 2014-10-03 DIAGNOSIS — R339 Retention of urine, unspecified: Secondary | ICD-10-CM | POA: Diagnosis not present

## 2014-10-03 DIAGNOSIS — N179 Acute kidney failure, unspecified: Secondary | ICD-10-CM | POA: Diagnosis not present

## 2014-10-03 DIAGNOSIS — R300 Dysuria: Secondary | ICD-10-CM | POA: Diagnosis not present

## 2014-10-03 LAB — CBC
HCT: 28.4 % — ABNORMAL LOW (ref 39.0–52.0)
Hemoglobin: 8.9 g/dL — ABNORMAL LOW (ref 13.0–17.0)
MCH: 29 pg (ref 26.0–34.0)
MCHC: 31.3 g/dL (ref 30.0–36.0)
MCV: 92.5 fL (ref 78.0–100.0)
PLATELETS: 254 10*3/uL (ref 150–400)
RBC: 3.07 MIL/uL — ABNORMAL LOW (ref 4.22–5.81)
RDW: 13.5 % (ref 11.5–15.5)
WBC: 5.7 10*3/uL (ref 4.0–10.5)

## 2014-10-03 LAB — BASIC METABOLIC PANEL
ANION GAP: 5 (ref 5–15)
BUN: 22 mg/dL — ABNORMAL HIGH (ref 6–20)
CO2: 26 mmol/L (ref 22–32)
Calcium: 7.5 mg/dL — ABNORMAL LOW (ref 8.9–10.3)
Chloride: 111 mmol/L (ref 101–111)
Creatinine, Ser: 1.15 mg/dL (ref 0.61–1.24)
GFR calc Af Amer: >60 mL/min (ref >60)
GFR calc non Af Amer: 53 mL/min — ABNORMAL LOW (ref >60)
Glucose, Bld: 85 mg/dL (ref 65–99)
POTASSIUM: 4.1 mmol/L (ref 3.5–5.1)
Sodium: 142 mmol/L (ref 135–145)

## 2014-10-03 MED ORDER — DEGARELIX ACETATE 120 MG ~~LOC~~ SOLR
240.0000 mg | Freq: Once | SUBCUTANEOUS | Status: AC
  Administered 2014-10-03: 240 mg via SUBCUTANEOUS
  Filled 2014-10-03: qty 6

## 2014-10-03 MED ORDER — SENNOSIDES-DOCUSATE SODIUM 8.6-50 MG PO TABS: 1.0000 | ORAL_TABLET | Freq: Every day | ORAL | Status: DC

## 2014-10-03 MED ORDER — IOHEXOL 300 MG/ML  SOLN
25.0000 mL | INTRAMUSCULAR | Status: AC
  Administered 2014-10-03 (×2): 25 mL via ORAL

## 2014-10-03 MED ORDER — POLYETHYLENE GLYCOL 3350 17 G PO PACK: 17.0000 g | PACK | Freq: Every day | ORAL | Status: DC

## 2014-10-03 MED ORDER — TAMSULOSIN HCL 0.4 MG PO CAPS: 0.4000 mg | ORAL_CAPSULE | Freq: Every day | ORAL | Status: DC

## 2014-10-03 MED ORDER — IOHEXOL 300 MG/ML  SOLN
100.0000 mL | Freq: Once | INTRAMUSCULAR | Status: AC | PRN
Start: 2014-10-03 — End: 2014-10-03
  Administered 2014-10-03: 80 mL via INTRAVENOUS

## 2014-10-03 NOTE — Op Note (Signed)
[name], [name]              ACCOUNT NO.:  [id number]  MEDICAL RECORD NO.:  [id number]  LOCATION:  9628                         FACILITY:  [facility]  PHYSICIAN:  Marshall Cork. Jeffie Pollock, M.D.    DATE OF BIRTH:  1917/10/22  DATE OF PROCEDURE:  10/02/2014 DATE OF DISCHARGE:                              OPERATIVE REPORT   PROCEDURE: 1. Prostate ultrasound with ultrasound-guided prostate biopsy. 2. Cystoscopy.  PREOPERATIVE DIAGNOSIS:  Elevated PSA and urinary retention, rule out prostate cancer.  POSTOPERATIVE DIAGNOSIS:  Elevated PSA and urinary retention, rule out prostate cancer.  SURGEON:  Marshall Cork. Jeffie Pollock, M.D.  ANESTHESIA:  General.  SPECIMEN:  Seven prostate biopsy cores.  DRAIN:  An 18-French Foley catheter.  BLOOD LOSS:  None.  COMPLICATIONS:  None.  INDICATIONS:  [name] is a 81 year old white male, who was admitted a few days ago with a creatinine of 5.87, bilateral hydronephrosis, and urinary retention.  A PSA was done after the prostate exam was felt to be nodular, PSA was elevated at 59.  It was felt that prostate biopsy and cystoscopy were indicated.  FINDINGS OF PROCEDURE:  He was given Rocephin and gentamicin and taken to the operating room where general anesthetic was induced.  He was placed in lithotomy position after being fitted with the PAS hose.  A transrectal ultrasound probe was assembled and inserted and scanning was performed, this revealed bilateral seminal vesicles with some possible infiltration in the midline from the base of the prostate between the seminal vesicles possibly involving the seminal vesicles. The prostate itself had hypoechogenicity with asymmetry of the peripheral zone, left worse than right, with some suggestion at the left base of possible extracapsular extension.  There were no calcifications. The prostate volume is 26.3 mL with a width of 3.91, height of 3.24, and a length of 3.97 cm.  After completion of the diagnostic  scan, biopsies were obtained.  Single biopsy was obtained from the seminal vesicle mass, then cores were obtained from the right base, right mid, right apex, left base, left mid, and left apex.  It was not felt that a 12-core biopsy was indicated.  A  total of 7 cores were obtained.  Once the biopsy was obtained, the probe was removed.  He had no significant bleeding.  He then was prepped with Betadine solution and draped in usual sterile fashion, underwent flexible cystoscopy. Examination revealed a normal urethra.  The external sphincter was intact.  The prostatic urethra was 2-3 cm in length with bilobar hyperplasia with obstruction.  There appeared to be some potential infiltration in the trigone posteriorly.  The ureteral orifices were not identified.  He had severe trabeculation with multiple small cellules and diverticula.  There was catheter inflammation of the mucosa, but no obvious tumors or stones were seen.  Once cystoscopy was completed, the scope was removed, and an 18-French Foley catheter was reintroduced.  The balloon was filled with 10 mL of sterile fluid, and the catheter was placed to straight drainage.  The patient was taken down from lithotomy position.  His anesthetic was reversed.  He was moved to recovery room in stable condition.  There were no complications.  Marshall Cork. Jeffie Pollock, M.D.     JJW/MEDQ  D:  10/02/2014  T:  10/03/2014  Job:  [id number]

## 2014-10-03 NOTE — Progress Notes (Signed)
Pt for discharge to [facility].  CSW facilitated pt discharge needs including contacting facility, sending pt discharge information via [facility], discussing with pt and pt family at bedside, providing RN phone number to call report, and providing discharge packet to pt and pt family at bedside. Pt family plan to transport pt via private vehicle to [facility]  Pt and pt family received news that pt biopsy positive for prostate cancer. Pt and pt family coping appropriately with diagnosis and treatment plan. Support provided.  No further social work needs identified at this time.  CSW signing off.   Alison Murray, MSW, [place] Work [phone]

## 2014-10-03 NOTE — Discharge Summary (Signed)
Physician Discharge Summary  Anthony Nicholson DGU:[id number] DOB: Feb 15, 1918 DOA: 09/29/2014  PCP: Serita Grammes, CNM  Admit date: 09/29/2014 Discharge date: 10/03/2014  Time spent: 65 minutes  Recommendations for Outpatient Follow-up:  1. Patient be discharged to [facility] skilled nursing facility. Patient will follow-up with M.D. at the skilled nursing facility. Patient needs a basic metabolic profile done as well as a CBC done in 1 week. 2. Patient is to follow-up with Dr. Jeffie Pollock, urology in 2 weeks for voiding trial and further management of newly diagnosed prostatic adenocarcinoma.  Discharge Diagnoses:  Principal Problem:   Acute urinary retention Active Problems:   Acute renal failure   Adenocarcinoma of prostate   Hyperkalemia   Anemia of chronic disease   Elevated PSA   Nodular prostate   Urinary retention   Acute renal failure syndrome   Discharge Condition: Stable and improved  Diet recommendation: Regular  Filed Weights   09/29/14 1520 09/30/14 0639 10/03/14 0535  Weight: 61.236 kg (135 lb) 60.192 kg (132 lb 11.2 oz) 58.968 kg (130 lb)    History of present illness:  Per Dr Doyle Askew Patient is a 81 year old male, fairly independent and healthy at baseline but with known history of basal cell carcinoma in remission, presented to [facility] emergency department with main concern of 3 days duration of trouble urinating. He explained he initially thought he was having difficulty with voiding due to constipation and had tried several laxative OTC which offered no relief. He reported somewhat similar problem in the past and self resolving, reported had PSA checked this year and was within normal limits. He denied fevers and chills, no specific abdominal concerns except pressure in the lower abdominal area. He denied nausea or vomiting, no chest pain, no shortness of breath.  In emergency department, patient noted to be hemodynamically stable, VSS, blood work notable for  potassium 5.3, CR 5.87, PVR checked > 400 cc in bladder. Foley placed and TRH asked to admit for further evaluation and management.  Hospital Course:  #1 acute urinary retention with obstructive uropathy/hydronephrosis and acute renal failure Patient was admitted with acute urinary retention with obstructive uropathy and hydronephrosis and acute renal failure as noted on renal ultrasound. Foley catheter was placed. PSA obtained was elevated at 58.14. Patient had good urine output via Foley catheter. Patient was also placed on Flomax. Urology consultation was obtained and patient was in in consultation by Dr. Jeffie Pollock. Patient underwent a prostatic biopsy and cystoscopy on 10/02/2014. Patient also underwent a CT of the abdomen and pelvis as well as a bone scan. Biopsy results came back as positive for prostatic adenocarcinoma with a Gleason score 7. Patient was given a dose of firmagon 240mg  SQ X 1. It was recommended that patient be also placed on Flomax 3 days prior to outpatient follow-up with urology in 2 weeks for voiding trial. Patient be discharged to a skilled nursing facility in stable and improved condition.  #2 acute renal failure Secondary to obstructive uropathy. Renal ultrasound which was done showed mildly bilateral hydronephrosis greater on the right as well as bladder wall thickening. Foley catheter was placed. Patient was hydrated gently with IV fluids. She has renal function improved on a daily basis and creatinine trended down from 5.87 on day of admission to 1.15 on day of discharge. Patient will be discharged with a Foley catheter and is to follow-up with urology as outpatient.   #3 nodular prostate with elevated PSA secondary to prostatic adenocarcinoma Gleason score 7 Patient was noted  to have a elevated PSA during his hospitalization of 58.14. Patient was seen in consultation by urology who recommended a prostate biopsy as well as cystoscopy which was done on 10/02/2014. Biopsy results  came back positive for prostatic adenocarcinoma with a Gleason score 7. Patient was given a dose of firmagon 240mg  sq x 1 on the day of discharge to begin androgen ablation rapidly. CT of the abdomen and pelvis were done as well as a bone scan which was pending at time of discharge. Patient will follow-up with Dr. Jeffie Pollock of urology 2 weeks post discharge.   #4 hyperkalemia Secondary to acute renal failure. Resolved.  #5 anemia of chronic disease No signs of active bleeding. Hemoglobin remained stable and on day of discharge was 8.9.   #6 hyponatremia Likely secondary to hypovolemic hyponatremia. Resolved with hydration.   Procedures:  Renal ultrasound 09/29/2014  Prostate ultrasound and biopsy with flexible cystoscopy: Dr. Jeffie Pollock 10/02/2014  CT abdomen and pelvis 10/03/2014  Bone scan 10/03/2014   Consultations:  Urology: Dr.Wrenn 09/29/2014    Discharge Exam: Filed Vitals:   10/03/14 0535  BP: 114/63  Pulse: 73  Temp: 98.7 F (37.1 C)  Resp: 16    General: NAD Cardiovascular: RRR Respiratory: CTAB  Discharge Instructions   Discharge Instructions    Diet general    Complete by:  As directed      Discharge instructions    Complete by:  As directed   Follow up with Dr Jeffie Pollock in 2 weeks for voiding trial. Keep foley catheter in     Increase activity slowly    Complete by:  As directed           Current Discharge Medication List    START taking these medications   Details  polyethylene glycol (MIRALAX / GLYCOLAX) packet Take 17 g by mouth daily. Hold if develops diarrhea Qty: 14 each, Refills: 0    senna-docusate (SENOKOT-S) 8.6-50 MG per tablet Take 1 tablet by mouth at bedtime.    tamsulosin (FLOMAX) 0.4 MG CAPS capsule Take 1 capsule (0.4 mg total) by mouth daily after supper. Start 3 days, prior to follow up with urology Qty: 30 capsule, Refills: 0      CONTINUE these medications which have NOT CHANGED   Details  aspirin 81 MG tablet Take 81 mg by  mouth daily.    cholecalciferol (VITAMIN D) 1000 UNITS tablet Take 1,000 Units by mouth 2 (two) times daily.     Miconazole Nitrate 2 % AERO Apply 1 application topically daily as needed (athlete's foot).    Multiple Vitamins-Minerals (MULTIVITAMIN ADULT PO) Take 1 tablet by mouth daily.      STOP taking these medications     terbinafine (LAMISIL AT) 1 % cream        No Known Allergies Follow-up Information    Follow up with Malka So, MD.   Specialty:  Urology   Why:  Pt has a f/u appt with Dr.Wrenn on Monday June 27 at [place] information:   [place] [id number] [phone]       Please follow up.   Why:  Follow-up with M.D. at the skilled nursing facility.       The results of significant diagnostics from this hospitalization (including imaging, microbiology, ancillary and laboratory) are listed below for reference.    Significant Diagnostic Studies: Nm Bone Scan Whole Body  10/03/2014   CLINICAL DATA:  Prostate cancer, PSA = 58.1  EXAM: NUCLEAR  MEDICINE WHOLE BODY BONE SCAN  TECHNIQUE: Whole body anterior and posterior images were obtained approximately 3 hours after intravenous injection of radiopharmaceutical.  RADIOPHARMACEUTICALS:  26 mCi of Technetium-55m MDP IV  COMPARISON:  None ; radiographic correlation: CT abdomen and pelvis 10/03/2014  FINDINGS: Uptake within L5 vertebral body.  Additional uptake RIGHT lateral L2-L3 at a site where disc space narrowing and significant endplate spur formation are identified by CT.  No additional abnormal/worrisome sites of osseous tracer accumulation identified to suggest osseous metastatic disease.  Uptake at the wrists, shoulders, first costochondral junctions bilaterally and RIGHT ankle, typically degenerative.  Expected soft tissue distribution of tracer.  Retention of tracer within BILATERAL renal collecting systems and within RIGHT greater than LEFT ureters raising question of bladder outlet or  BILATERAL distal ureteral obstruction.  IMPRESSION: Increased uptake L4 vertebral body, cannot exclude osseous metastatic disease.  Suspected degenerative type uptake at RIGHT L2-L3.  BILATERAL renal collecting system and ureteral retention of tracer RIGHT greater than LEFT raising question of bladder outlet obstruction or BILATERAL distal ureteral obstruction ; recent CT demonstrates marked bladder wall thickening and BILATERAL hydroureteronephrosis likely due to obstruction, question due to tumor or cystitis.   Electronically Signed   By: Lavonia Dana M.D.   On: 10/03/2014 15:43   Ct Abdomen Pelvis W Contrast  10/03/2014   CLINICAL DATA:  Difficulty urinating, constipation, abdominal distension pressure.  EXAM: CT ABDOMEN AND PELVIS WITH CONTRAST  TECHNIQUE: Multidetector CT imaging of the abdomen and pelvis was performed using the standard protocol following bolus administration of intravenous contrast.  CONTRAST:  40mL OMNIPAQUE IOHEXOL 300 MG/ML  SOLN  COMPARISON:  None.  FINDINGS: Lower chest: Lung bases show small bilateral effusions. Pleural calcification is seen on the left. Minimal compressive atelectasis in both lower lobes. 3 mm right lower lobe nodule (series 5, image 1). Heart size normal. No pericardial effusion. Moderate hiatal hernia. Lymph nodes adjacent to the hiatal hernia are sub cm in short axis size.  Hepatobiliary: Liver and gallbladder are unremarkable. No biliary ductal dilatation.  Pancreas: Negative.  Spleen: Negative.  Adrenals/Urinary Tract: Adrenal glands are unremarkable. There may be a few scattered tiny low-attenuation lesions in the kidneys, too small to characterize. There may be a tiny stone in the lower pole left kidney. Moderate bilateral hydronephrosis. Bladder is thick-walled with areas of irregularity, especially superiorly. Haziness is seen in the fat surrounding the bladder. Foley catheter and air are seen within the bladder.  Stomach/Bowel: Moderate hiatal hernia.  Stomach, small bowel and colon are otherwise unremarkable. Difficult to exclude rectal wall thickening. Appendix is normal.  Vascular/Lymphatic: Atherosclerotic calcification of the arterial vasculature without abdominal aortic aneurysm. No pathologically enlarged lymph nodes.  Reproductive: Prostate is normal in size.  Other: Mild presacral edema. Mesenteries and peritoneum are otherwise unremarkable. No free fluid.  Musculoskeletal: No worrisome lytic or sclerotic lesions. Degenerative changes are seen in the spine. L1 superior endplate compression fracture is unchanged from 05/04/2010.  IMPRESSION: 1. Marked bladder wall thickening with areas of irregularity and perivesical haziness. Findings may be due to cystitis. Malignancy cannot be excluded. 2. Associated moderate bilateral hydronephrosis. 3. Difficult to exclude rectal wall thickening. 4. Small bilateral effusions. 5. 3 mm right lower lobe nodule, nonspecific. If the patient is at high risk for bronchogenic carcinoma, follow-up chest CT at 1 year is recommended. If the patient is at low risk, no follow-up is needed. This recommendation follows the consensus statement: Guidelines for Management of Small Pulmonary Nodules Detected on CT  Scans: A Statement from the [place] as published in Radiology 2005; 237:395-400.   Electronically Signed   By: Lorin Picket M.D.   On: 10/03/2014 11:28   US Renal  09/29/2014   CLINICAL DATA:  Renal failure  EXAM: RENAL / URINARY TRACT ULTRASOUND COMPLETE  COMPARISON:  None.  FINDINGS: Right Kidney:  Length: 9 cm from generalized atrophy. Mild hydronephrosis, accentuated by an extrarenal pelvis. No masses. Normal cortical echogenicity  Left Kidney:  Length: 9 cm from generalized atrophy. Mild hydronephrosis. Normal cortical echogenicity. No masses.  Bladder:  Moderately distended, despite indwelling Foley. The wall is circumferentially thickened with trabeculated appearance posteriorly.  IMPRESSION: 1. Mild  bilateral hydronephrosis, greater on the right. 2. Foley catheter with partial drainage of the bladder. 3. Bladder wall thickening, often from chronic outlet obstruction.   Electronically Signed   By: Monte Fantasia M.D.   On: 09/29/2014 14:24    Microbiology: Recent Results (from the past 240 hour(s))  Surgical pcr screen     Status: None   Collection Time: 10/01/14  9:12 PM  Result Value Ref Range Status   MRSA, PCR NEGATIVE NEGATIVE Final   Staphylococcus aureus NEGATIVE NEGATIVE Final    Comment:        The Xpert SA Assay (FDA approved for NASAL specimens in patients over 65 years of age), is one component of a comprehensive surveillance program.  Test performance has been validated by [facility] for patients greater than or equal to 2 year old. It is not intended to diagnose infection nor to guide or monitor treatment.      Labs: Basic Metabolic Panel:  Recent Labs Lab 09/29/14 1035 09/30/14 0425 10/01/14 0415 10/02/14 0412 10/03/14 0359  NA 134* 140 142 140 142  K 5.3* 4.3 3.9 4.3 4.1  CL 99* 112* 110 106 111  CO2 21* 24 25 28 26   GLUCOSE 91 89 95 94 85  BUN 99* 69* 42* 30* 22*  CREATININE 5.87* 2.57* 1.41* 1.17 1.15  CALCIUM 8.3* 7.5* 8.0* 8.2* 7.5*   Liver Function Tests: No results for input(s): AST, ALT, ALKPHOS, BILITOT, PROT, ALBUMIN in the last 168 hours. No results for input(s): LIPASE, AMYLASE in the last 168 hours. No results for input(s): AMMONIA in the last 168 hours. CBC:  Recent Labs Lab 09/29/14 1035 09/30/14 0425 10/01/14 0415 10/02/14 0412 10/03/14 0359  WBC 8.1 5.7 6.8 6.0 5.7  NEUTROABS 6.8  --   --   --   --   HGB 9.7* 9.1* 8.9* 9.4* 8.9*  HCT 30.3* 27.7* 27.9* 29.4* 28.4*  MCV 92.1 92.3 91.8 92.2 92.5  PLT 206 217 227 249 254   Cardiac Enzymes: No results for input(s): CKTOTAL, CKMB, CKMBINDEX, TROPONINI in the last 168 hours. BNP: BNP (last 3 results) No results for input(s): BNP in the last 8760 hours.  ProBNP (last  3 results) No results for input(s): PROBNP in the last 8760 hours.  CBG: No results for input(s): GLUCAP in the last 168 hours.     SignedIrine Seal MD [facility] 10/03/2014, 3:51 PM

## 2014-10-03 NOTE — Progress Notes (Signed)
Put order in for NPO for CT scans today.

## 2014-10-03 NOTE — Clinical Social Work Placement (Signed)
   CLINICAL SOCIAL WORK PLACEMENT  NOTE  Date:  10/03/2014  Patient Details  Name: Anthony Nicholson MRN: [id number] Date of Birth: [birth year]  Clinical Social Work is seeking post-discharge placement for this patient at the [place] level of care (*CSW will initial, date and re-position this form in  chart as items are completed):  Yes   Patient/family provided with [facility] Work Department's list of facilities offering this level of care within the geographic area requested by the patient (or if unable, by the patient's family).  Yes   Patient/family informed of their freedom to choose among providers that offer the needed level of care, that participate in Medicare, Medicaid or managed care program needed by the patient, have an available bed and are willing to accept the patient.  Yes   Patient/family informed of [facility]'s ownership interest in [facility] and [facility], as well as of the fact that they are under no obligation to receive care at these facilities.  PASRR submitted to EDS on 10/01/14     PASRR number received on 10/01/14     Existing PASRR number confirmed on       FL2 transmitted to all facilities in geographic area requested by pt/family on 10/01/14     FL2 transmitted to all facilities within larger geographic area on       Patient informed that his/her managed care company has contracts with or will negotiate with certain facilities, including the following:        Yes   Patient/family informed of bed offers received.  Patient chooses bed at [facility]     Physician recommends and patient chooses bed at      Patient to be transferred to [facility] on 10/03/14.  Patient to be transferred to facility by pt family via private vehicle     Patient family notified on 10/03/14 of transfer.  Name of family member notified:  pt and pt family notified at bedside     PHYSICIAN Please sign FL2     Additional  Comment:    _______________________________________________ Ladell Pier, LCSW 10/03/2014, 5:33 PM

## 2014-10-03 NOTE — Progress Notes (Signed)
Report called to York Cerise, Therapist, sports at [facility].

## 2014-10-03 NOTE — Progress Notes (Addendum)
Patient ID: Anthony Nicholson, male   DOB: Aug 02, 1917, 81 y.o.   MRN: [id number] 1 Day Post-Op  Subjective: Anthony Nicholson is doing well post biopsy with no hematuria or fever.   He voice no complaints but is a bit confused about his locatioin.   ROS:  Review of Systems  Constitutional: Negative for fever and chills.  Gastrointestinal: Negative for abdominal pain.  Genitourinary: Negative for hematuria.  Psychiatric/Behavioral: Positive for memory loss.  All other systems reviewed and are negative.   Anti-infectives: Anti-infectives    Start     Dose/Rate Route Frequency Ordered Stop   10/02/14 1100  gentamicin (GARAMYCIN) 300 mg in dextrose 5 % 100 mL IVPB     5 mg/kg  60.2 kg 107.5 mL/hr over 60 Minutes Intravenous 30 min pre-op 10/01/14 1243 10/02/14 1137   10/02/14 1000  cefTRIAXone (ROCEPHIN) 2 g in dextrose 5 % 50 mL IVPB - Premix     2 g 100 mL/hr over 30 Minutes Intravenous  Once 10/01/14 1243 10/02/14 0956      Current Facility-Administered Medications  Medication Dose Route Frequency Provider Last Rate Last Dose  . 0.9 %  sodium chloride infusion   Intravenous Continuous Dionne Milo, NP 75 mL/hr at 10/03/14 0426 1,000 mL at 10/03/14 0426  . acetaminophen (TYLENOL) tablet 650 mg  650 mg Oral Q6H PRN Gardiner Barefoot, NP   650 mg at 09/29/14 2134  . bisacodyl (DULCOLAX) EC tablet 5 mg  5 mg Oral Daily PRN Eugenie Filler, MD      . calcium carbonate (TUMS - dosed in mg elemental calcium) chewable tablet 200 mg of elemental calcium  1 tablet Oral PRN Theodis Blaze, MD   200 mg of elemental calcium at 09/29/14 1742  . MUSCLE RUB CREA   Topical PRN Gardiner Barefoot, NP      . ondansetron [facility]) tablet 4 mg  4 mg Oral Q6H PRN Theodis Blaze, MD       Or  . ondansetron [facility]) injection 4 mg  4 mg Intravenous Q6H PRN Theodis Blaze, MD   4 mg at 10/02/14 1300  . polyethylene glycol (MIRALAX / GLYCOLAX) packet 17 g  17 g Oral Daily Eugenie Filler, MD   17 g at  10/01/14 1746  . senna-docusate (Senokot-S) tablet 1 tablet  1 tablet Oral QHS Eugenie Filler, MD   1 tablet at 10/01/14 2114     Objective: Vital signs in last 24 hours: Temp:  [97.5 F (36.4 C)-98.7 F (37.1 C)] 98.7 F (37.1 C) (06/17 0535) Pulse Rate:  [65-82] 73 (06/17 0535) Resp:  [13-17] 16 (06/17 0535) BP: (114-147)/(55-94) 114/63 mmHg (06/17 0535) SpO2:  [96 %-100 %] 98 % (06/17 0535) Weight:  [58.968 kg (130 lb)] 58.968 kg (130 lb) (06/17 0535)  Intake/Output from previous day: 06/16 0701 - 06/17 0700 In: 2247.5 [P.O.:360; I.V.:1887.5] Out: 1950 [Urine:1950] Intake/Output this shift:     Physical Exam  Constitutional: He is well-developed, well-nourished, and in no distress.  Genitourinary:  Urine remains clear in the foley tubing.     Lab Results:   Recent Labs  10/02/14 0412 10/03/14 0359  WBC 6.0 5.7  HGB 9.4* 8.9*  HCT 29.4* 28.4*  PLT 249 254   BMET  Recent Labs  10/02/14 0412 10/03/14 0359  NA 140 142  K 4.3 4.1  CL 106 111  CO2 28 26  GLUCOSE 94 85  BUN 30* 22*  CREATININE 1.17 1.15  CALCIUM 8.2* 7.5*   PT/INR No results for input(s): LABPROT, INR in the last 72 hours. ABG No results for input(s): PHART, HCO3 in the last 72 hours.  Invalid input(s): PCO2, PO2  Studies/Results: No results found.   Assessment: s/p Procedure(s): PROSTATE ULTRASOUND AND BIOPSY  FLEXIBLE CYSTOSCOPY  He is doing well post biopsy but is sundowning some.   Path report shows extensive Gleason 7(4+3) cancer in 7/7 cores with perineural invasion.  The CT shows persistent bilateral hydronephrosis with a thick bladder wall and possible subtrigonal infiltration of the prostate cancer.  No adenopathy was noted.  Bone scan is still pending.    Plan: Pathology pending but based on high suspicion for at least locally advanced prostate cancer I have ordered a bonescan and CT Pelvis to stage him for possible mets.  If the biopsy is positive as I  expect, he will need to be started on Firmagon 240mg  sq or either be considered for a bilateral orchiectomy.   I am going to order Firmagon 240mg  sq today to begin the androgen ablation rapidly and he will need f/u in 2 weeks for a voiding trial.   I will contact his son-in-law      LOS: 4 days    Malka So 10/03/2014

## 2014-10-06 ENCOUNTER — Non-Acute Institutional Stay (SKILLED_NURSING_FACILITY): Payer: Medicare Other | Admitting: Internal Medicine

## 2014-10-06 DIAGNOSIS — B3789 Other sites of candidiasis: Secondary | ICD-10-CM | POA: Diagnosis not present

## 2014-10-06 DIAGNOSIS — R5381 Other malaise: Secondary | ICD-10-CM | POA: Diagnosis not present

## 2014-10-06 DIAGNOSIS — R338 Other retention of urine: Secondary | ICD-10-CM | POA: Diagnosis not present

## 2014-10-06 DIAGNOSIS — K5900 Constipation, unspecified: Secondary | ICD-10-CM

## 2014-10-06 DIAGNOSIS — D638 Anemia in other chronic diseases classified elsewhere: Secondary | ICD-10-CM | POA: Diagnosis not present

## 2014-10-06 DIAGNOSIS — C61 Malignant neoplasm of prostate: Secondary | ICD-10-CM | POA: Diagnosis not present

## 2014-10-06 NOTE — Progress Notes (Signed)
Patient ID: Anthony Nicholson, male   DOB: Nov 13, 1917, 81 y.o.   MRN: [id number]     Facility: [facility]    PCP: Serita Grammes, CNM  Code Status: DNR  No Known Allergies  Chief Complaint  Patient presents with  . New Admit To SNF     HPI:  81 year old patient is here for short term rehabilitation post hospital admission from 09/29/14-10/03/14 with acute urinary retention and acute renal failure. He was diagnosed to have adenocarcinoma of prostate causing obstructive uropathy with hydronephrosis. Foley catheter was placed and he had good urine output. PSA resulted at 58.14. He was seen by urology and underwent prostate biopsy and cystoscopy on 10/02/14 resulting in adenocarcinoma with gleason score of 7. He received a dose of firmagon. He is seen in his room today. He is working with therapy team. He denies any concerns. He is telling me stories about world war II. No new concerns from staff.  Review of Systems:  Constitutional: Negative for fever, chills, diaphoresis. positive for weakness. HENT: Negative for headache, congestion, nasal discharge Eyes: Negative for eye pain, blurred vision, double vision and discharge.  Respiratory: Negative for cough, shortness of breath and wheezing.   Cardiovascular: Negative for chest pain, palpitations, leg swelling.  Gastrointestinal: Negative for heartburn, nausea, vomiting, abdominal pain. Bowel movement 4 days back, has been constipated Genitourinary: has foley in place, denies flank pain Musculoskeletal: Negative for back pain, falls Skin: Negative for itching, rash.  Neurological: Negative for dizziness, tingling Psychiatric/Behavioral: Negative for depression.    Past Medical History  Diagnosis Date  . Basal cell carcinoma   . Laceration of arm 02/04/2013   Past Surgical History  Procedure Laterality Date  . Incision and drainage Left 02/04/2013    left arm laceration    Dr Doran Durand  .  sharpnel removal     1943    . I&d extremity Left 02/04/2013    Procedure: IRRIGATION AND DEBRIDEMENT Left Arm Laceration with Repair as Necessary;  Surgeon: Wylene Simmer, MD;  Location: [facility];  Service: Orthopedics;  Laterality: Left;  . Prostate biopsy N/A 10/02/2014    Procedure: PROSTATE ULTRASOUND AND BIOPSY ;  Surgeon: Irine Seal, MD;  Location: [facility];  Service: Urology;  Laterality: N/A;  . Cystoscopy N/A 10/02/2014    Procedure: FLEXIBLE CYSTOSCOPY;  Surgeon: Irine Seal, MD;  Location: [facility];  Service: Urology;  Laterality: N/A;   Social History:   reports that he has never smoked. He has never used smokeless tobacco. He reports that he does not drink alcohol or use illicit drugs.  No family history on file.  Medications: Patient's Medications  New Prescriptions   No medications on file  Previous Medications   ASPIRIN 81 MG TABLET    Take 81 mg by mouth daily.   CHOLECALCIFEROL (VITAMIN D) 1000 UNITS TABLET    Take 1,000 Units by mouth 2 (two) times daily.    MICONAZOLE NITRATE 2 % AERO    Apply 1 application topically daily as needed (athlete's foot).   MULTIPLE VITAMINS-MINERALS (MULTIVITAMIN ADULT PO)    Take 1 tablet by mouth daily.   POLYETHYLENE GLYCOL (MIRALAX / GLYCOLAX) PACKET    Take 17 g by mouth daily. Hold if develops diarrhea   SENNA-DOCUSATE (SENOKOT-S) 8.6-50 MG PER TABLET    Take 1 tablet by mouth at bedtime.   TAMSULOSIN (FLOMAX) 0.4 MG CAPS CAPSULE    Take 1 capsule (0.4 mg total) by mouth daily after supper.  Start 3 days, prior to follow up with urology  Modified Medications   No medications on file  Discontinued Medications   No medications on file     Physical Exam: Filed Vitals:   10/06/14 1211  BP: 130/60  Pulse: 86  Temp: 97.5 F (36.4 C)  Resp: 18  Height: 5\' 6"  (1.676 m)  Weight: 123 lb (55.792 kg)  SpO2: 95%    General- elderly male, thin built, in no acute distress Head- normocephalic, atraumatic Throat- moist mucus membrane Eyes- no pallor, no icterus,  no discharge, normal conjunctiva, normal sclera Neck- no cervical lymphadenopathy, no jugular vein distension Cardiovascular- normal s1,s2, no murmurs, palpable dorsalis pedis, trace pedal edema Respiratory- bilateral clear to auscultation, no wheeze, no rhonchi, no crackles, no use of accessory muscles Abdomen- bowel sounds present, soft, non tender, no guarding or rigidity, no CVA tenderness, foley in place Musculoskeletal- able to move all 4 extremities, generalized weakness  Neurological- no focal deficit, alert and oriented  Skin- warm and dry Psychiatry- normal mood and affect    Labs reviewed: Basic Metabolic Panel:  Recent Labs  10/01/14 0415 10/02/14 0412 10/03/14 0359  NA 142 140 142  K 3.9 4.3 4.1  CL 110 106 111  CO2 25 28 26   GLUCOSE 95 94 85  BUN 42* 30* 22*  CREATININE 1.41* 1.17 1.15  CALCIUM 8.0* 8.2* 7.5*   CBC:  Recent Labs  09/29/14 1035  10/01/14 0415 10/02/14 0412 10/03/14 0359  WBC 8.1  < > 6.8 6.0 5.7  NEUTROABS 6.8  --   --   --   --   HGB 9.7*  < > 8.9* 9.4* 8.9*  HCT 30.3*  < > 27.9* 29.4* 28.4*  MCV 92.1  < > 91.8 92.2 92.5  PLT 206  < > 227 249 254  < > = values in this interval not displayed.   Radiological Exams:  09/29/14 ultrasound of the kidneys IMPRESSION: 1. Mild bilateral hydronephrosis, greater on the right. 2. Foley catheter with partial drainage of the bladder. 3. Bladder wall thickening, often from chronic outlet obstruction.   10/03/14 CT abdomen and pelvis with contrast IMPRESSION: 1. Marked bladder wall thickening with areas of irregularity and perivesical haziness. Findings may be due to cystitis. Malignancy cannot be excluded. 2. Associated moderate bilateral hydronephrosis. 3. Difficult to exclude rectal wall thickening. 4. Small bilateral effusions. 5. 3 mm right lower lobe nodule, nonspecific. If the patient is at high risk for bronchogenic carcinoma, follow-up chest CT at 1 year is recommended. If the  patient is at low risk, no follow-up is needed. This recommendation follows the consensus statement: Guidelines for Management of Small Pulmonary Nodules Detected on CT Scans: A Statement from the [place] as published in Radiology 2005; 237:395-400.   10/03/14 NM bone scan whole body IMPRESSION: Increased uptake L4 vertebral body, cannot exclude osseous metastatic disease. Suspected degenerative type uptake at RIGHT L2-L3. BILATERAL renal collecting system and ureteral retention of tracer RIGHT greater than LEFT raising question of bladder outlet obstruction or BILATERAL distal ureteral obstruction ; recent CT demonstrates marked bladder wall thickening and BILATERAL hydroureteronephrosis likely due to obstruction, question due to tumor or cystitis.    Assessment/Plan  Physical deconditioning Will have him work with physical therapy and occupational therapy team to help with gait training and muscle strengthening exercises.fall precautions. Skin care. Encourage to be out of bed.   Acute urinary retention With prostate adenocarcinoma confirmed by biopsy. S/p 1 dose of firmagon. Has foley  in place with good urine output. Has follow up with dr Jeffie Pollock in 2 weeks. Monitor clinically for now  Adenocarcinoma of prostate See above. Reviewed NM scan showing possible metastases to the bone. Will need to start flomax 3 days prior to follow up with urology. Has urology appointment on 10/13/14  Constipation On miralax and senna s daily. Change senna s to 2 tablet at bedtime. Hydration encouraged  Anemia of chronic disease Low h&h, hb 8.9 at discharge, recheck cbc   candida rash of groin in groin and buttock area, continue skin care and nystatin cream    Goals of care: short term rehabilitation   Labs/tests ordered: cbc with diff, cmp 10/09/14  Family/ staff Communication: reviewed care plan with patient and nursing supervisor    Blanchie Serve, MD  [place] [phone] (Monday-Friday 8 am - 5 pm) [phone] (afterhours)

## 2014-10-15 ENCOUNTER — Non-Acute Institutional Stay (SKILLED_NURSING_FACILITY): Payer: Medicare Other | Admitting: Nurse Practitioner

## 2014-10-15 DIAGNOSIS — K5900 Constipation, unspecified: Secondary | ICD-10-CM | POA: Diagnosis not present

## 2014-10-15 DIAGNOSIS — D638 Anemia in other chronic diseases classified elsewhere: Secondary | ICD-10-CM

## 2014-10-15 DIAGNOSIS — C61 Malignant neoplasm of prostate: Secondary | ICD-10-CM | POA: Diagnosis not present

## 2014-10-15 DIAGNOSIS — R338 Other retention of urine: Secondary | ICD-10-CM

## 2014-10-15 NOTE — Progress Notes (Signed)
Patient ID: Anthony Nicholson, male   DOB: 1917/04/24, 81 y.o.   MRN: [id number]    Nursing Home Location:  [place] of Service: SNF (31)  PCP: Serita Grammes, CNM  No Known Allergies  Chief Complaint  Patient presents with  . Discharge Note    HPI:  Patient is a 81 y.o. male seen today at [facility] for discharge home. Pt here for short term rehabilitation post hospital admission from 09/29/14-10/03/14 with acute urinary retention and acute renal failure. He was diagnosed to have adenocarcinoma of prostate causing obstructive uropathy with hydronephrosis, PSA of 58. Foley catheter was placed and he had good urine output. Urology following at this time, underwent prostate biopsy and cystoscopy on 10/02/14 resulting in adenocarcinoma with gleason score of 7. He received a dose of firmagon. Followed up with urologist and failed voiding trail and remains with foley. Patient currently doing well with therapy, now stable to discharge home with home health and outpatient follow up  Review of Systems:  Review of Systems  Constitutional: Negative for activity change, appetite change, fatigue and unexpected weight change.  HENT: Negative for congestion and hearing loss.   Eyes: Negative.   Respiratory: Negative for cough and shortness of breath.   Cardiovascular: Negative for chest pain, palpitations and leg swelling.  Gastrointestinal: Negative for abdominal pain, diarrhea and constipation.  Genitourinary:       Foley catheter   Musculoskeletal: Negative for myalgias and arthralgias.  Skin: Negative for color change and wound.  Neurological: Negative for dizziness and weakness.  Psychiatric/Behavioral: Negative for behavioral problems, confusion and agitation.    Past Medical History  Diagnosis Date  . Basal cell carcinoma   . Laceration of arm 02/04/2013   Past Surgical History  Procedure Laterality Date  . Incision and drainage Left  02/04/2013    left arm laceration    Dr Doran Durand  .  sharpnel removal    1943    . I&d extremity Left 02/04/2013    Procedure: IRRIGATION AND DEBRIDEMENT Left Arm Laceration with Repair as Necessary;  Surgeon: Wylene Simmer, MD;  Location: [facility];  Service: Orthopedics;  Laterality: Left;  . Prostate biopsy N/A 10/02/2014    Procedure: PROSTATE ULTRASOUND AND BIOPSY ;  Surgeon: Irine Seal, MD;  Location: [facility];  Service: Urology;  Laterality: N/A;  . Cystoscopy N/A 10/02/2014    Procedure: FLEXIBLE CYSTOSCOPY;  Surgeon: Irine Seal, MD;  Location: [facility];  Service: Urology;  Laterality: N/A;   Social History:   reports that he has never smoked. He has never used smokeless tobacco. He reports that he does not drink alcohol or use illicit drugs.  No family history on file.  Medications: Patient's Medications  New Prescriptions   No medications on file  Previous Medications   ASPIRIN 81 MG TABLET    Take 81 mg by mouth daily.   CHOLECALCIFEROL (VITAMIN D) 1000 UNITS TABLET    Take 1,000 Units by mouth 2 (two) times daily.    MICONAZOLE NITRATE 2 % AERO    Apply 1 application topically daily as needed (athlete's foot).   MULTIPLE VITAMINS-MINERALS (MULTIVITAMIN ADULT PO)    Take 1 tablet by mouth daily.   POLYETHYLENE GLYCOL (MIRALAX / GLYCOLAX) PACKET    Take 17 g by mouth daily. Hold if develops diarrhea   SENNA-DOCUSATE (SENOKOT-S) 8.6-50 MG PER TABLET    Take 1 tablet by mouth at bedtime.   TAMSULOSIN (FLOMAX) 0.4  MG CAPS CAPSULE    Take 1 capsule (0.4 mg total) by mouth daily after supper. Start 3 days, prior to follow up with urology  Modified Medications   No medications on file  Discontinued Medications   No medications on file     Physical Exam: Filed Vitals:   10/15/14 1313  BP: 104/64  Pulse: 88  Temp: 98.2 F (36.8 C)  Resp: 20    Physical Exam  Constitutional: He is oriented to person, place, and time. He appears well-developed and well-nourished. No distress.  Frail  elderly male, NAD  HENT:  Head: Normocephalic and atraumatic.  Mouth/Throat: Oropharynx is clear and moist. No oropharyngeal exudate.  Eyes: Conjunctivae and EOM are normal. Pupils are equal, round, and reactive to light.  Neck: Normal range of motion. Neck supple.  Cardiovascular: Normal rate, regular rhythm and normal heart sounds.   Pulmonary/Chest: Effort normal and breath sounds normal.  Abdominal: Soft. Bowel sounds are normal.  Musculoskeletal: He exhibits no edema or tenderness.  Neurological: He is alert and oriented to person, place, and time.  Skin: Skin is warm and dry. He is not diaphoretic.  Psychiatric: He has a normal mood and affect.    Labs reviewed: Basic Metabolic Panel:  Recent Labs  10/01/14 0415 10/02/14 0412 10/03/14 0359  NA 142 140 142  K 3.9 4.3 4.1  CL 110 106 111  CO2 25 28 26   GLUCOSE 95 94 85  BUN 42* 30* 22*  CREATININE 1.41* 1.17 1.15  CALCIUM 8.0* 8.2* 7.5*   Liver Function Tests: No results for input(s): AST, ALT, ALKPHOS, BILITOT, PROT, ALBUMIN in the last 8760 hours. No results for input(s): LIPASE, AMYLASE in the last 8760 hours. No results for input(s): AMMONIA in the last 8760 hours. CBC:  Recent Labs  09/29/14 1035  10/01/14 0415 10/02/14 0412 10/03/14 0359  WBC 8.1  < > 6.8 6.0 5.7  NEUTROABS 6.8  --   --   --   --   HGB 9.7*  < > 8.9* 9.4* 8.9*  HCT 30.3*  < > 27.9* 29.4* 28.4*  MCV 92.1  < > 91.8 92.2 92.5  PLT 206  < > 227 249 254  < > = values in this interval not displayed. TSH: No results for input(s): TSH in the last 8760 hours. A1C: No results found for: HGBA1C Lipid Panel: No results for input(s): CHOL, HDL, LDLCALC, TRIG, CHOLHDL, LDLDIRECT in the last 8760 hours.   Result Date: 10/09/14 01:09 PM      Analyte   Result Value   Ref. Range    Units   Out of Range   Lab  WBC  7.8  4.0-10.5  K/uL    SLN  RBC  3.12  4.22-5.81  MIL/uL  L    Hemoglobin  9.1  13.0-17.0  g/dL  L    Hematocrit  27.6  39.0-52.0  %   L    MCV  88.5  78.0-100.0  fL      MCH  29.2  26.0-34.0  pg      MCHC  33.0  30.0-36.0  g/dL      RDW  13.7  11.5-15.5  %      Platelet Count  335  150-400  K/uL      MPV  9.2  8.6-12.4  fL      Granulocyte %  70  43-77  %      Absolute Gran  5.5  1.7-7.7  K/uL  Lymph %  16  12-46  %      Absolute Lymph  1.2  0.7-4.0  K/uL      Mono %  8  3-12  %      Absolute Mono  0.6  0.1-1.0  K/uL      Eos %  6  0-5  %  H    Absolute Eos  0.5  0.0-0.7  K/uL      Baso %  0  0-1  %      Absolute Baso  0.0  0.0-0.1  K/uL      Smear Review  Criteria for review not met          Comprehensive Metabolic Panel  Status: Final Out of Range  Result Date: 10/09/14 01:09 PM      Analyte   Result Value   Ref. Range    Units   Out of Range   Lab  Sodium  138  135-145  mEq/L    SLN  Potassium  4.7  3.5-5.3  mEq/L      Chloride  104  96-112  mEq/L      CO2  24  19-32  mEq/L      Glucose  84  70-99  mg/dL      BUN  38  6-23  mg/dL  H    Creatinine  1.26  0.50-1.35  mg/dL      Bilirubin, Total  0.2  0.2-1.2  mg/dL      Alkaline Phosphatase  49  39-117  U/L      AST/SGOT  13  0-37  U/L      ALT/SGPT  9  0-53  U/L      Total Protein  5.4  6.0-8.3  g/dL  L    Albumin  2.9  3.5-5.2  g/dL  L    Calcium  8.3  8.4-10.5  mg/dL  L   Radiological Exams:  09/29/14 ultrasound of the kidneys IMPRESSION: 1. Mild bilateral hydronephrosis, greater on the right. 2. Foley catheter with partial drainage of the bladder. 3. Bladder wall thickening, often from chronic outlet obstruction.  10/03/14 CT abdomen and pelvis with contrast IMPRESSION: 1. Marked bladder wall thickening with areas of irregularity and perivesical haziness. Findings may be due to cystitis. Malignancy cannot be excluded. 2. Associated moderate bilateral hydronephrosis. 3. Difficult to exclude rectal wall thickening. 4. Small bilateral effusions. 5. 3 mm right lower lobe nodule, nonspecific. If the patient is at high risk for bronchogenic  carcinoma, follow-up chest CT at 1 year is recommended. If the patient is at low risk, no follow-up is needed. This recommendation follows the consensus statement: Guidelines for Management of Small Pulmonary Nodules Detected on CT Scans: A Statement from the [place] as published in Radiology 2005; 237:395-400.  10/03/14 NM bone scan whole body IMPRESSION: Increased uptake L4 vertebral body, cannot exclude osseous metastatic disease. Suspected degenerative type uptake at RIGHT L2-L3. BILATERAL renal collecting system and ureteral retention of tracer RIGHT greater than LEFT raising question of bladder outlet obstruction or BILATERAL distal ureteral obstruction ; recent CT demonstrates marked bladder wall thickening and BILATERAL hydroureteronephrosis likely due to obstruction, question due to tumor or cystitis. Assessment/Plan 1. Acute urinary retention Has foley in place with good urine output after prostate cancer dx. Failed voiding trail, has another follow up with urology  -to start flomax 3 days prior to voiding trail  2. Primary adenocarcinoma of prostate prostate adenocarcinoma confirmed by biopsy. S/p  1 dose of firmagon.prostate adenocarcinoma confirmed by biopsy.  -ongoing follow up with urology  3. Constipation, unspecified constipation type -improved  4. Anemia of chronic disease hgb improved to 9.1 from 8.9 on discharge, ongoing follow up by PCP  pt is stable for discharge-will need PT/OT/Nursing per home health. No DME needed. Rx written.  will need to follow up with PCP within 2 weeks.    Carlos American. Harle Battiest  [facility] [phone] 8 am - 5 pm) [phone] (after hours)

## 2014-12-09 ENCOUNTER — Emergency Department (HOSPITAL_COMMUNITY): Payer: Medicare Other

## 2014-12-09 ENCOUNTER — Inpatient Hospital Stay (HOSPITAL_COMMUNITY): Payer: Medicare Other

## 2014-12-09 ENCOUNTER — Encounter (HOSPITAL_COMMUNITY): Payer: Self-pay

## 2014-12-09 ENCOUNTER — Inpatient Hospital Stay (HOSPITAL_COMMUNITY)
Admission: EM | Admit: 2014-12-09 | Discharge: 2014-12-13 | DRG: 698 | Disposition: A | Discharge status: Home Health | Payer: Medicare Other | Attending: Internal Medicine | Admitting: Internal Medicine

## 2014-12-09 DIAGNOSIS — Z66 Do not resuscitate: Secondary | ICD-10-CM | POA: Diagnosis present

## 2014-12-09 DIAGNOSIS — IMO0001 Reserved for inherently not codable concepts without codable children: Secondary | ICD-10-CM

## 2014-12-09 DIAGNOSIS — R918 Other nonspecific abnormal finding of lung field: Secondary | ICD-10-CM

## 2014-12-09 DIAGNOSIS — D638 Anemia in other chronic diseases classified elsewhere: Secondary | ICD-10-CM | POA: Diagnosis present

## 2014-12-09 DIAGNOSIS — N183 Chronic kidney disease, stage 3 (moderate): Secondary | ICD-10-CM | POA: Diagnosis present

## 2014-12-09 DIAGNOSIS — K5900 Constipation, unspecified: Secondary | ICD-10-CM | POA: Diagnosis present

## 2014-12-09 DIAGNOSIS — R739 Hyperglycemia, unspecified: Secondary | ICD-10-CM | POA: Diagnosis present

## 2014-12-09 DIAGNOSIS — T8351XA Infection and inflammatory reaction due to indwelling urinary catheter, initial encounter: Principal | ICD-10-CM | POA: Diagnosis present

## 2014-12-09 DIAGNOSIS — R7989 Other specified abnormal findings of blood chemistry: Secondary | ICD-10-CM | POA: Diagnosis not present

## 2014-12-09 DIAGNOSIS — G9340 Encephalopathy, unspecified: Secondary | ICD-10-CM | POA: Diagnosis present

## 2014-12-09 DIAGNOSIS — Y846 Urinary catheterization as the cause of abnormal reaction of the patient, or of later complication, without mention of misadventure at the time of the procedure: Secondary | ICD-10-CM | POA: Diagnosis present

## 2014-12-09 DIAGNOSIS — Z823 Family history of stroke: Secondary | ICD-10-CM

## 2014-12-09 DIAGNOSIS — Z85828 Personal history of other malignant neoplasm of skin: Secondary | ICD-10-CM

## 2014-12-09 DIAGNOSIS — A419 Sepsis, unspecified organism: Secondary | ICD-10-CM | POA: Diagnosis present

## 2014-12-09 DIAGNOSIS — R338 Other retention of urine: Secondary | ICD-10-CM | POA: Diagnosis not present

## 2014-12-09 DIAGNOSIS — N1330 Unspecified hydronephrosis: Secondary | ICD-10-CM | POA: Diagnosis present

## 2014-12-09 DIAGNOSIS — I214 Non-ST elevation (NSTEMI) myocardial infarction: Secondary | ICD-10-CM | POA: Diagnosis not present

## 2014-12-09 DIAGNOSIS — N179 Acute kidney failure, unspecified: Secondary | ICD-10-CM | POA: Diagnosis present

## 2014-12-09 DIAGNOSIS — A4151 Sepsis due to Escherichia coli [E. coli]: Secondary | ICD-10-CM | POA: Diagnosis not present

## 2014-12-09 DIAGNOSIS — E872 Acidosis: Secondary | ICD-10-CM | POA: Diagnosis present

## 2014-12-09 DIAGNOSIS — E876 Hypokalemia: Secondary | ICD-10-CM | POA: Diagnosis present

## 2014-12-09 DIAGNOSIS — B9620 Unspecified Escherichia coli [E. coli] as the cause of diseases classified elsewhere: Secondary | ICD-10-CM | POA: Diagnosis present

## 2014-12-09 DIAGNOSIS — I361 Nonrheumatic tricuspid (valve) insufficiency: Secondary | ICD-10-CM | POA: Diagnosis not present

## 2014-12-09 DIAGNOSIS — R6521 Severe sepsis with septic shock: Secondary | ICD-10-CM | POA: Diagnosis present

## 2014-12-09 DIAGNOSIS — R778 Other specified abnormalities of plasma proteins: Secondary | ICD-10-CM

## 2014-12-09 DIAGNOSIS — N170 Acute kidney failure with tubular necrosis: Secondary | ICD-10-CM | POA: Diagnosis not present

## 2014-12-09 DIAGNOSIS — N390 Urinary tract infection, site not specified: Secondary | ICD-10-CM | POA: Diagnosis not present

## 2014-12-09 DIAGNOSIS — Z7982 Long term (current) use of aspirin: Secondary | ICD-10-CM

## 2014-12-09 DIAGNOSIS — I248 Other forms of acute ischemic heart disease: Secondary | ICD-10-CM | POA: Diagnosis not present

## 2014-12-09 DIAGNOSIS — R7881 Bacteremia: Secondary | ICD-10-CM | POA: Diagnosis not present

## 2014-12-09 DIAGNOSIS — C61 Malignant neoplasm of prostate: Secondary | ICD-10-CM | POA: Diagnosis not present

## 2014-12-09 HISTORY — DX: Unspecified hearing loss, unspecified ear: H91.90

## 2014-12-09 LAB — CBC WITH DIFFERENTIAL/PLATELET
BASOS ABS: 0 10*3/uL (ref 0.0–0.1)
Basophils Relative: 0 % (ref 0–1)
EOS ABS: 0 10*3/uL (ref 0.0–0.7)
Eosinophils Relative: 0 % (ref 0–5)
HCT: 33.3 % — ABNORMAL LOW (ref 39.0–52.0)
HEMOGLOBIN: 10.7 g/dL — AB (ref 13.0–17.0)
LYMPHS ABS: 0.3 10*3/uL — AB (ref 0.7–4.0)
LYMPHS PCT: 5 % — AB (ref 12–46)
MCH: 29.3 pg (ref 26.0–34.0)
MCHC: 32.1 g/dL (ref 30.0–36.0)
MCV: 91.2 fL (ref 78.0–100.0)
Monocytes Absolute: 0 10*3/uL — ABNORMAL LOW (ref 0.1–1.0)
Monocytes Relative: 0 % — ABNORMAL LOW (ref 3–12)
NEUTROS PCT: 95 % — AB (ref 43–77)
Neutro Abs: 5.8 10*3/uL (ref 1.7–7.7)
Platelets: 272 10*3/uL (ref 150–400)
RBC: 3.65 MIL/uL — AB (ref 4.22–5.81)
RDW: 13.6 % (ref 11.5–15.5)
WBC: 6.2 10*3/uL (ref 4.0–10.5)

## 2014-12-09 LAB — COMPREHENSIVE METABOLIC PANEL
ALT: 17 U/L (ref 17–63)
AST: 36 U/L (ref 15–41)
Albumin: 3.7 g/dL (ref 3.5–5.0)
Alkaline Phosphatase: 77 U/L (ref 38–126)
Anion gap: 14 (ref 5–15)
BUN: 43 mg/dL — ABNORMAL HIGH (ref 6–20)
CHLORIDE: 103 mmol/L (ref 101–111)
CO2: 20 mmol/L — ABNORMAL LOW (ref 22–32)
CREATININE: 1.64 mg/dL — AB (ref 0.61–1.24)
Calcium: 8.8 mg/dL — ABNORMAL LOW (ref 8.9–10.3)
GFR, EST AFRICAN AMERICAN: 40 mL/min — AB (ref >60)
GFR, EST NON AFRICAN AMERICAN: 34 mL/min — AB (ref >60)
Glucose, Bld: 122 mg/dL — ABNORMAL HIGH (ref 65–99)
POTASSIUM: 5.1 mmol/L (ref 3.5–5.1)
Sodium: 137 mmol/L (ref 135–145)
TOTAL PROTEIN: 6.8 g/dL (ref 6.5–8.1)
Total Bilirubin: 0.6 mg/dL (ref 0.3–1.2)

## 2014-12-09 LAB — I-STAT CG4 LACTIC ACID, ED: LACTIC ACID, VENOUS: 4.07 mmol/L — AB (ref 0.5–2.0)

## 2014-12-09 LAB — APTT: APTT: 29 s (ref 24–37)

## 2014-12-09 LAB — LACTIC ACID, PLASMA: Lactic Acid, Venous: 1.4 mmol/L (ref 0.5–2.0)

## 2014-12-09 LAB — PROCALCITONIN: PROCALCITONIN: 55.05 ng/mL

## 2014-12-09 LAB — PROTIME-INR
INR: 1.11 (ref 0.00–1.49)
PROTHROMBIN TIME: 14.5 s (ref 11.6–15.2)

## 2014-12-09 MED ORDER — ACETAMINOPHEN 650 MG RE SUPP: 650.0000 mg | Freq: Four times a day (QID) | RECTAL | Status: DC | PRN

## 2014-12-09 MED ORDER — DEXTROSE 5 % IV SOLN
500.0000 mg | Freq: Once | INTRAVENOUS | Status: AC
  Administered 2014-12-09: 500 mg via INTRAVENOUS
  Filled 2014-12-09: qty 500

## 2014-12-09 MED ORDER — MORPHINE SULFATE (PF) 2 MG/ML IV SOLN: 0.5000 mg | INTRAVENOUS | Status: DC | PRN

## 2014-12-09 MED ORDER — TAMSULOSIN HCL 0.4 MG PO CAPS
0.4000 mg | ORAL_CAPSULE | Freq: Every day | ORAL | Status: DC
  Administered 2014-12-09: 0.4 mg via ORAL
  Filled 2014-12-09: qty 1

## 2014-12-09 MED ORDER — PIPERACILLIN-TAZOBACTAM 3.375 G IVPB
3.3750 g | Freq: Once | INTRAVENOUS | Status: AC
  Administered 2014-12-09: 3.375 g via INTRAVENOUS
  Filled 2014-12-09: qty 50

## 2014-12-09 MED ORDER — ONDANSETRON HCL 4 MG PO TABS: 4.0000 mg | ORAL_TABLET | Freq: Four times a day (QID) | ORAL | Status: DC | PRN

## 2014-12-09 MED ORDER — SODIUM CHLORIDE 0.9 % IV SOLN
INTRAVENOUS | Status: AC
  Administered 2014-12-09 – 2014-12-10 (×2): via INTRAVENOUS

## 2014-12-09 MED ORDER — ZOLPIDEM TARTRATE 5 MG PO TABS
5.0000 mg | ORAL_TABLET | Freq: Once | ORAL | Status: AC
  Administered 2014-12-09: 5 mg via ORAL
  Filled 2014-12-09: qty 1

## 2014-12-09 MED ORDER — SODIUM CHLORIDE 0.9 % IV BOLUS (SEPSIS)
1000.0000 mL | Freq: Once | INTRAVENOUS | Status: AC
  Administered 2014-12-09: 1000 mL via INTRAVENOUS

## 2014-12-09 MED ORDER — ACETAMINOPHEN 325 MG PO TABS
650.0000 mg | ORAL_TABLET | Freq: Once | ORAL | Status: AC
  Administered 2014-12-09: 650 mg via ORAL
  Filled 2014-12-09: qty 2

## 2014-12-09 MED ORDER — ACETAMINOPHEN 325 MG PO TABS
650.0000 mg | ORAL_TABLET | Freq: Four times a day (QID) | ORAL | Status: DC | PRN
  Administered 2014-12-09: 650 mg via ORAL
  Filled 2014-12-09: qty 2

## 2014-12-09 MED ORDER — PIPERACILLIN-TAZOBACTAM 3.375 G IVPB
3.3750 g | Freq: Three times a day (TID) | INTRAVENOUS | Status: DC
  Administered 2014-12-10 – 2014-12-13 (×11): 3.375 g via INTRAVENOUS
  Filled 2014-12-09 (×11): qty 50

## 2014-12-09 MED ORDER — ONDANSETRON HCL 4 MG/2ML IJ SOLN: 4.0000 mg | Freq: Four times a day (QID) | INTRAMUSCULAR | Status: DC | PRN

## 2014-12-09 MED ORDER — VANCOMYCIN HCL IN DEXTROSE 1-5 GM/200ML-% IV SOLN
1000.0000 mg | Freq: Once | INTRAVENOUS | Status: AC
  Administered 2014-12-09: 1000 mg via INTRAVENOUS
  Filled 2014-12-09: qty 200

## 2014-12-09 NOTE — ED Notes (Signed)
Notified Dr. Thomasene Lot regarding antibiotics.  Phone call rec'd by [facility].  Md aware and addressing issue

## 2014-12-09 NOTE — Plan of Care (Signed)
Problem: Phase I Progression Outcomes Goal: Voiding-avoid urinary catheter unless indicated Outcome: Not Applicable Date Met:  84/83/50 Chronic cath use

## 2014-12-09 NOTE — ED Notes (Signed)
MD [facility] notified about elevated lactic acid

## 2014-12-09 NOTE — ED Notes (Signed)
Patient had a foley cath placed placed at [facility] on 12/05/14. Patient has had 4 foley catheters since June. Patient was taken to the [facility] for a Urology appointment. Patient began having hallucinations, increased confusion, and attempted a couple of times to climb ouot of the car while moving.

## 2014-12-09 NOTE — ED Notes (Signed)
No urine in bag.  Abdomen soft.  1 liter fluid in. Will notify MD

## 2014-12-09 NOTE — ED Notes (Addendum)
Ice placed under arm pits for cooling.  Light sheet on patient.  Family made aware of treatment.  Urine bag changed in attempt to get clean sample for lab

## 2014-12-09 NOTE — Consult Note (Signed)
Urology Consult   Physician requesting consult: Thomasene Lot, MD  Reason for consult: Sepsis  History of Present Illness: Anthony Nicholson is a 81 y.o. 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 He is a patient of Dr. Irine Seal at [facility]. He has had issues with acute urinary retention requiring management with a chronic indwelling foley catheter. He was most recently seen 12/05/14 at which point his foley catheter was exchanged uneventfully. We are planning to have him return for urodynamics in September to evaluate his outlet and detrusor functionality. Of note, at last clinic visit he was found to have a urinary tract infection (pan-sensitive E. Coli)  and was started on Bactrim.   Unfortunately, while driving home from an appointment in [place] today he began having shaking chills and changes in mental status prompting visit to the [facility] emergency department with signs of sepsis as he was noted to have a temperature to 104 with associated tachycardia and a lactate of 4.0.Sepsis protocol was initiated.    No evidence of serum leukocytosis, however he was noted have some acute kidney injury with a creatinine of 1.64, last noted to be in the 1-1.4 range. Chest x-ray demonstrated interstitial opacities as well as biapical pleural parenchymal thickening and low lung volumes.   Interview is limited as patient had just been given ambien for sleep and is not able to participate in meaningful conversation. The above information was obtained from chart review and discussion with nursing staff.   Past Medical History  Diagnosis Date  . Basal cell carcinoma   . Laceration of arm 02/04/2013    Past Surgical History  Procedure Laterality Date  . Incision and drainage Left 02/04/2013    left arm laceration    Dr Doran Durand  .  sharpnel removal    1943    . I&d extremity Left 02/04/2013     Procedure: IRRIGATION AND DEBRIDEMENT Left Arm Laceration with Repair as Necessary;  Surgeon: Wylene Simmer, MD;  Location: [facility];  Service: Orthopedics;  Laterality: Left;  . Prostate biopsy N/A 10/02/2014    Procedure: PROSTATE ULTRASOUND AND BIOPSY ;  Surgeon: Irine Seal, MD;  Location: [facility];  Service: Urology;  Laterality: N/A;  . Cystoscopy N/A 10/02/2014    Procedure: FLEXIBLE CYSTOSCOPY;  Surgeon: Irine Seal, MD;  Location: [facility];  Service: Urology;  Laterality: N/A;    Current Hospital Medications:  Home Meds:    Medication List    ASK your doctor about these medications        aspirin 81 MG tablet  Take 81 mg by mouth daily.     cholecalciferol 1000 UNITS tablet  Commonly known as:  VITAMIN D  Take 1,000 Units by mouth daily.     FIRMAGON 80 MG injection  Generic drug:  degarelix  Inject 80 mg into the skin every 28 (twenty-eight) days.     lidocaine 2 % jelly  Commonly known as:  XYLOCAINE  Place 1 application into the urethra once.     liver oil-zinc oxide 40 % ointment  Commonly known as:  DESITIN  Apply 1 application topically 2 (two) times daily. To sores on bottom.     Miconazole Nitrate 2 % Aero  Apply 1 application topically daily as needed (athlete's foot).     MULTIVITAMIN ADULT PO  Take 1 tablet by mouth daily.     nystatin cream  Commonly known as:  MYCOSTATIN  Apply 1 application topically 2 (two) times daily. To sores on bottom.     polyethylene glycol packet  Commonly known as:  MIRALAX / GLYCOLAX  Take 17 g by mouth daily. Hold if develops diarrhea     senna-docusate 8.6-50 MG per tablet  Commonly known as:  Senokot-S  Take 1 tablet by mouth at bedtime.     sulfamethoxazole-trimethoprim 800-160 MG per tablet  Commonly known as:  BACTRIM DS,SEPTRA DS  Take 0.5 tablets by mouth 2 (two) times daily.     tamsulosin 0.4 MG Caps capsule  Commonly known as:  FLOMAX  Take 1 capsule (0.4 mg total) by mouth daily after supper. Start 3 days,  prior to follow up with urology        Scheduled Meds: . piperacillin-tazobactam (ZOSYN)  IV  3.375 g Intravenous 3 times per day  . tamsulosin  0.4 mg Oral QPC supper   Continuous Infusions: . sodium chloride 150 mL/hr at 12/09/14 2300   PRN Meds:.  Allergies: No Known Allergies  Family History  Problem Relation Age of Onset  . Prostate cancer Neg Hx     Social History:  reports that he has never smoked. He has never used smokeless tobacco. He reports that he does not drink alcohol or use illicit drugs.  ROS: A ROS was not able to be performed as the patient is somnolent having just taken Ambien for sleep  Physical Exam:  Vital signs in last 24 hours: Temp:  [98.1 F (36.7 C)-104.1 F (40.1 C)] 98.1 F (36.7 C) (08/23 2002) Pulse Rate:  [97-140] 97 (08/23 2300) Resp:  [20-26] 26 (08/23 2300) BP: (99-162)/(45-75) 101/45 mmHg (08/23 2300) SpO2:  [95 %-100 %] 98 % (08/23 2300) Weight:  [60.782 kg (134 lb)-62.9 kg (138 lb 10.7 oz)] 62.9 kg (138 lb 10.7 oz) (08/23 2055) Constitutional:  Alert and oriented, No acute distress Cardiovascular: Regular rate and rhythm, No JVD Respiratory: Normal respiratory effort GI: Abdomen is soft, nontender, nondistended, no abdominal masses GU: No CVA tenderness, foley draining dark colored urine Lymphatic: No lymphadenopathy Neurologic: Grossly intact, no focal deficits Psychiatric: Normal mood and affect  Laboratory Data:   Recent Labs  12/09/14 1813  WBC 6.2  HGB 10.7*  HCT 33.3*  PLT 272     Recent Labs  12/09/14 1813  NA 137  K 5.1  CL 103  GLUCOSE 122*  BUN 43*  CALCIUM 8.8*  CREATININE 1.64*     Results for orders placed or performed during the hospital encounter of 12/09/14 (from the past 24 hour(s))  Culture, blood (routine x 2)     Status: None (Preliminary result)   Collection Time: 12/09/14  6:07 PM  Result Value Ref Range   Specimen Description      BLOOD RIGHT FOREARM Performed at [facility]    Special Requests BOTTLES DRAWN AEROBIC AND ANAEROBIC 5CC EACH    Culture PENDING    Report Status PENDING   Comprehensive metabolic panel     Status: Abnormal   Collection Time: 12/09/14  6:13 PM  Result Value Ref Range   Sodium 137 135 - 145 mmol/L   Potassium 5.1 3.5 - 5.1 mmol/L   Chloride 103 101 - 111 mmol/L   CO2 20 (L) 22 - 32 mmol/L   Glucose, Bld 122 (H) 65 - 99 mg/dL   BUN 43 (H) 6 - 20 mg/dL   Creatinine, Ser 1.64 (H) 0.61 - 1.24 mg/dL   Calcium 8.8 (L) 8.9 -  10.3 mg/dL   Total Protein 6.8 6.5 - 8.1 g/dL   Albumin 3.7 3.5 - 5.0 g/dL   AST 36 15 - 41 U/L   ALT 17 17 - 63 U/L   Alkaline Phosphatase 77 38 - 126 U/L   Total Bilirubin 0.6 0.3 - 1.2 mg/dL   GFR calc non Af Amer 34 (L) >60 mL/min   GFR calc Af Amer 40 (L) >60 mL/min   Anion gap 14 5 - 15  CBC with Differential     Status: Abnormal   Collection Time: 12/09/14  6:13 PM  Result Value Ref Range   WBC 6.2 4.0 - 10.5 K/uL   RBC 3.65 (L) 4.22 - 5.81 MIL/uL   Hemoglobin 10.7 (L) 13.0 - 17.0 g/dL   HCT 33.3 (L) 39.0 - 52.0 %   MCV 91.2 78.0 - 100.0 fL   MCH 29.3 26.0 - 34.0 pg   MCHC 32.1 30.0 - 36.0 g/dL   RDW 13.6 11.5 - 15.5 %   Platelets 272 150 - 400 K/uL   Neutrophils Relative % 95 (H) 43 - 77 %   Neutro Abs 5.8 1.7 - 7.7 K/uL   Lymphocytes Relative 5 (L) 12 - 46 %   Lymphs Abs 0.3 (L) 0.7 - 4.0 K/uL   Monocytes Relative 0 (L) 3 - 12 %   Monocytes Absolute 0.0 (L) 0.1 - 1.0 K/uL   Eosinophils Relative 0 0 - 5 %   Eosinophils Absolute 0.0 0.0 - 0.7 K/uL   Basophils Relative 0 0 - 1 %   Basophils Absolute 0.0 0.0 - 0.1 K/uL  I-Stat CG4 Lactic Acid, ED  (not at  [facility])     Status: Abnormal   Collection Time: 12/09/14  6:21 PM  Result Value Ref Range   Lactic Acid, Venous 4.07 (HH) 0.5 - 2.0 mmol/L   Comment NOTIFIED PHYSICIAN   Lactic acid, plasma     Status: None   Collection Time: 12/09/14 10:40 PM  Result Value Ref Range   Lactic Acid, Venous 1.4 0.5 - 2.0 mmol/L  Procalcitonin      Status: None   Collection Time: 12/09/14 10:40 PM  Result Value Ref Range   Procalcitonin 55.05 ng/mL  Protime-INR     Status: None   Collection Time: 12/09/14 10:40 PM  Result Value Ref Range   Prothrombin Time 14.5 11.6 - 15.2 seconds   INR 1.11 0.00 - 1.49  APTT     Status: None   Collection Time: 12/09/14 10:40 PM  Result Value Ref Range   aPTT 29 24 - 37 seconds   Recent Results (from the past 240 hour(s))  Culture, blood (routine x 2)     Status: None (Preliminary result)   Collection Time: 12/09/14  6:07 PM  Result Value Ref Range Status   Specimen Description   Final    BLOOD RIGHT FOREARM Performed at [facility]    Special Requests BOTTLES DRAWN AEROBIC AND ANAEROBIC [facility]  Final   Culture PENDING  Incomplete   Report Status PENDING  Incomplete    Renal Function:  Recent Labs  12/09/14 1813  CREATININE 1.64*   12/02/14 Urine Culture  E. coli- pan-sensitive  Estimated Creatinine Clearance: 24.5 mL/min (by C-G formula based on Cr of 1.64).  Radiologic Imaging: US Renal  12/10/2014   CLINICAL DATA:  Acute renal failure. Sepsis. Elevated BUN and creatinine.  EXAM: RENAL / URINARY TRACT ULTRASOUND COMPLETE  COMPARISON:  CT abdomen and pelvis 10/03/2014.  Ultrasound kidneys 09/29/2014.  FINDINGS: Right Kidney:  Length: 10.3 cm. Diffuse parenchymal thinning consistent with medical renal disease. Moderate hydronephrosis, similar prior study.  Left Kidney:  Length: 9.5 cm. Diffuse parenchymal thinning consistent with medical renal disease. Mild hydronephrosis, similar to prior study.  Bladder:  Bladder is decompressed with a Foley catheter and is not evaluated.  IMPRESSION: Diffuse parenchymal thinning bilaterally consistent with medical renal disease. Bilateral hydronephrosis similar prior study.   Electronically Signed   By: Lucienne Capers M.D.   On: 12/10/2014 00:09   Dg Chest Port 1 View  12/09/2014   CLINICAL DATA:  Patient with history of fever. History  of basal cell carcinoma and prostate cancer.  EXAM: PORTABLE CHEST - 1 VIEW  COMPARISON:  None.  FINDINGS: Monitoring leads overlie the patient. Normal cardiac and mediastinal contours. No large area of pulmonary consolidation. Coarse interstitial opacities. Biapical pleural parenchymal thickening. Low lung volumes. No pleural effusion or pneumothorax.  IMPRESSION: Interstitial opacities may represent component of atypical infection, chronic pulmonary changes or edema.  Biapical pleural parenchymal thickening.  Low lung volumes.   Electronically Signed   By: Lovey Newcomer M.D.   On: 12/09/2014 18:45    I independently reviewed the above imaging studies.  Impression/Recommendation 81 year old male with history of stage IV adenocarcinoma of the prostate currently managed on androgen deprivation therapy compensated by urinary retention managed by chronic indwelling Foley catheter with recent Escherichia coli urinary tract infection presenting with signs of sepsis. Just started making urine through foley after fluid hydration. Urinalysis without significant infectious parameters. Ultrasound with stable hydro bilaterally and decompressed bladder. CXR with evidence of possible pneumonia.  Cystitis versus pyelonephritis with concomitant sepsis as well as AKI  -Continue foley catheter to drainage  -Sepsis management per primary team -Continue broad spectrum antimicrobial coverage and narrow  -Recommend evaluating for alternative sources for sepsis  -Evaluate for pre-renal/intrinsic renal causes of AKI as there does not appear to be any evidence of obstruction given ultrasound findigns -Urology to monitor clinical progress likely no intervention needed, however, if clinically destabilizing would consider bilateral percutaneous nephrostomy tubes for maximal urinary drainage  Dr. Tresa Moore was available and will discuss with Dr. Marissa Nestle 12/10/2014, 12:13 AM    I have seen and examined the patient  and agree with above d plan.  Briefly,  S: Admitted for sepsis of GU v. Pulmonary source. Known locally advanced prostate cancer now on androgen deprivation and chronic foley, also known baseline moderate hydronephrosis with baseline Cr about 1.5.  O: NAD, AOx 3 at time of interview this AM SNTND No CVAT No bladder TTP Foley c/d/i with thick and protenacious urine  Tm 104, UA with bacteruria / pyrua. Cr 1.9. [place] with approx stable mod hydro  A/P:  Discussed options of maximaly aggressive therapy (bilat neph tubes) v. More conservative management (hydration, continued foley, antimicrobials) with pt in detail. At this point pt desires trial of conservative approach and may want to transition to purely palliative if fails to improve.  Primary team involved in discussion as well and all are in agreement.

## 2014-12-09 NOTE — H&P (Addendum)
[facility] History and Physical  Anthony Nicholson BJS:[id number] DOB: 06/06/1917 DOA: 12/09/2014  Referring physician: Dr.Mackuen. PCP: Serita Grammes, CNM  Specialists: Dr.Wren.  Chief Complaint: Fever and chills.  HPI: Anthony Nicholson is a 81 y.o. male with history of recent diagnosis of prostate cancer managed on andrgen deprivation, urinary obstruction on Foley catheter which was recently replaced on August 19 and at that time patient also was found to have urinary tract infection with culture showing Escherichia coli pansensitive presents to the ER the patient suddenly developed fever and chills with confusion while driving. In the ER patient was found to have a fever of 104F. Patient otherwise was denying any chest pain shortness of breath nausea vomiting abdominal pain or diarrhea. Patient did have one episode of diarrhea 3 days ago. Patient has been complaining of some penile pain around the Foley catheter placement area. Chest x-ray was showing possible edema versus infiltrates. Blood cultures were obtained. Lactic as was elevated at 4. Patient's creatinine also is increased from baseline. On-call urologist was consulted and patient has been admitted for sepsis most likely from urinary tract infection. On my exam patient presently is alert awake and oriented. Still has some penile pain. Denies any chest pain or shortness of breath. Abdomen appears benign. There is no active discharge from the penile area.  Review of Systems: As presented in the history of presenting illness, rest negative.  Past Medical History  Diagnosis Date  . Basal cell carcinoma   . Laceration of arm 02/04/2013   Past Surgical History  Procedure Laterality Date  . Incision and drainage Left 02/04/2013    left arm laceration    Dr Doran Durand  .  sharpnel removal    1943    . I&d extremity Left 02/04/2013    Procedure: IRRIGATION AND DEBRIDEMENT Left Arm Laceration with Repair as Necessary;  Surgeon: Wylene Simmer, MD;  Location: [facility];  Service: Orthopedics;  Laterality: Left;  . Prostate biopsy N/A 10/02/2014    Procedure: PROSTATE ULTRASOUND AND BIOPSY ;  Surgeon: Irine Seal, MD;  Location: [facility];  Service: Urology;  Laterality: N/A;  . Cystoscopy N/A 10/02/2014    Procedure: FLEXIBLE CYSTOSCOPY;  Surgeon: Irine Seal, MD;  Location: [facility];  Service: Urology;  Laterality: N/A;   Social History:  reports that he has never smoked. He has never used smokeless tobacco. He reports that he does not drink alcohol or use illicit drugs. Where does patient live home. Can patient participate in ADLs? Yes.  No Known Allergies  Family History:  Family History  Problem Relation Age of Onset  . Prostate cancer Neg Hx       Prior to Admission medications   Medication Sig Start Date End Date Taking? Authorizing Provider  aspirin 81 MG tablet Take 81 mg by mouth daily.   Yes Historical Provider, MD  cholecalciferol (VITAMIN D) 1000 UNITS tablet Take 1,000 Units by mouth daily.    Yes Historical Provider, MD  degarelix (FIRMAGON) 80 MG injection Inject 80 mg into the skin every 28 (twenty-eight) days.   Yes Historical Provider, MD  lidocaine (XYLOCAINE) 2 % jelly Place 1 application into the urethra once.   Yes Historical Provider, MD  liver oil-zinc oxide (DESITIN) 40 % ointment Apply 1 application topically 2 (two) times daily. To sores on bottom.   Yes Historical Provider, MD  Miconazole Nitrate 2 % AERO Apply 1 application topically daily as needed (athlete's foot).   Yes Historical Provider, MD  Multiple Vitamins-Minerals (  MULTIVITAMIN ADULT PO) Take 1 tablet by mouth daily.   Yes Historical Provider, MD  nystatin cream (MYCOSTATIN) Apply 1 application topically 2 (two) times daily. To sores on bottom.   Yes Historical Provider, MD  sulfamethoxazole-trimethoprim (BACTRIM DS,SEPTRA DS) 800-160 MG per tablet Take 0.5 tablets by mouth 2 (two) times daily.   Yes Historical Provider, MD  polyethylene glycol  (MIRALAX / GLYCOLAX) packet Take 17 g by mouth daily. Hold if develops diarrhea Patient not taking: Reported on 12/09/2014 10/03/14   Eugenie Filler, MD  senna-docusate (SENOKOT-S) 8.6-50 MG per tablet Take 1 tablet by mouth at bedtime. Patient not taking: Reported on 12/09/2014 10/03/14   Eugenie Filler, MD  tamsulosin (FLOMAX) 0.4 MG CAPS capsule Take 1 capsule (0.4 mg total) by mouth daily after supper. Start 3 days, prior to follow up with urology 10/10/14   Eugenie Filler, MD    Physical Exam: Filed Vitals:   12/09/14 1749 12/09/14 1808 12/09/14 2002 12/09/14 2055  BP: 158/75 162/64 114/63 104/51  Pulse: 140 128 107 103  Temp: 101.2 F (38.4 C) 104.1 F (40.1 C) 98.1 F (36.7 C)   TempSrc: Oral Rectal Oral   Resp: 24 26 26 20   Height: 5\' 3"  (1.6 m)   5\' 8"  (1.727 m)  Weight: 60.782 kg (134 lb)   62.9 kg (138 lb 10.7 oz)  SpO2: 98% 98% 95% 97%     General:  Moderately built and nourished.  Eyes: Anicteric no pallor.  ENT: No discharge from the ears eyes nose and mouth.  Neck: No mass felt. No JVD elevated.  Cardiovascular: S1 and S2 heard.  Respiratory: No rhonchi or crepitations.  Abdomen: Soft nontender bowel sounds present.  Skin: No rash.  Musculoskeletal: No edema.  Psychiatric: Appears normal.  Neurologic: Alert awake oriented to time place and person. Moves all extremities. No facial symmetry. Tongue is midline.  Labs on Admission:  Basic Metabolic Panel:  Recent Labs Lab 12/09/14 1813  NA 137  K 5.1  CL 103  CO2 20*  GLUCOSE 122*  BUN 43*  CREATININE 1.64*  CALCIUM 8.8*   Liver Function Tests:  Recent Labs Lab 12/09/14 1813  AST 36  ALT 17  ALKPHOS 77  BILITOT 0.6  PROT 6.8  ALBUMIN 3.7   No results for input(s): LIPASE, AMYLASE in the last 168 hours. No results for input(s): AMMONIA in the last 168 hours. CBC:  Recent Labs Lab 12/09/14 1813  WBC 6.2  NEUTROABS 5.8  HGB 10.7*  HCT 33.3*  MCV 91.2  PLT 272   Cardiac  Enzymes: No results for input(s): CKTOTAL, CKMB, CKMBINDEX, TROPONINI in the last 168 hours.  BNP (last 3 results) No results for input(s): BNP in the last 8760 hours.  ProBNP (last 3 results) No results for input(s): PROBNP in the last 8760 hours.  CBG: No results for input(s): GLUCAP in the last 168 hours.  Radiological Exams on Admission: Dg Chest Port 1 View  12/09/2014   CLINICAL DATA:  Patient with history of fever. History of basal cell carcinoma and prostate cancer.  EXAM: PORTABLE CHEST - 1 VIEW  COMPARISON:  None.  FINDINGS: Monitoring leads overlie the patient. Normal cardiac and mediastinal contours. No large area of pulmonary consolidation. Coarse interstitial opacities. Biapical pleural parenchymal thickening. Low lung volumes. No pleural effusion or pneumothorax.  IMPRESSION: Interstitial opacities may represent component of atypical infection, chronic pulmonary changes or edema.  Biapical pleural parenchymal thickening.  Low lung volumes.  Electronically Signed   By: Lovey Newcomer M.D.   On: 12/09/2014 18:45    EKG: Independently reviewed. Sinus tachycardia with incomplete right bundle branch block like pattern. I have discussed the EKG with on-call cardiologist Dr. Tommi Rumps.  Assessment/Plan Principal Problem:   Sepsis Active Problems:   Acute renal failure   UTI (lower urinary tract infection)   1. Sepsis - most likely source is UTI. Chest x-ray does show some abnormal infiltrate pattern concerning for pneumonia also. Patient has no respiratory tract symptoms. Patient denies any abdominal pain but does have some penile pain around the Foley catheter. Appreciate urology consult. At this time after obtaining blood cultures procalcitonin levels patient has been placed on vancomycin and Zosyn. Patient also received 1 dose of Zithromax for possible pneumonia and will be continued. Check UA urine culture repeat lactic acid and will also check urine for strep antigen and  Legionella antigen has chest x-ray was showing possibility of pneumonia. Continue with aggressive hydration. Patient has received 30 mL per kilogram body weight bolus in the ER. Closely monitor and stepdown. As mentioned in the history of present illness patient is recent urine cultures had shown Escherichia coli which was pansensitive. 2. Acute renal failure with urinary obstruction on Foley catheter - I have discussed urologist Dr. Ottis Stain. Stat renal sonogram has been ordered to make sure there is no obstruction. Patient is yet to have good urine output. Closely monitor metabolic panel intake output and respiratory status. 3. Acute encephalopathy - probably secondary to sepsis essentially resolved at this time. Closely observe. 4. Chronic anemia - follow CBC. If there is no further decline in hemoglobin and further workup as outpatient. 5. History of prostate cancer being managed by antigen deprivation per urology. 6. Abnormal EKG - patient's EKG was showing sinus tachycardia with abnormal conduction pattern. I have discussed with on-call cardiologist Dr. Tommi Rumps. Cardiologist feels the patient's EKG probably could be due to fever. Closely observe. Patient denies any chest pain or shortness of breath.  I have reviewed patient's old charts and labs. Personally reviewed chest x-ray and EKG. I have discussed with on-call cardiologist and urologist.   Addendum - patient's blood pressure improved with fluids and lactic acid also improved. Subsequently patient's blood pressure again started decreasing with elevation of lactic acid levels. Patient does have good urine output at around 800 mL so far. I have discussed with on-call pulmonary critical care Dr. Ronnette Juniper. Critical care will be seeing patient in consult. I have ordered 2 more liters normal saline bolus with starting of phenylephrine. I also discussed with urologist again about the renal sonogram showing bilateral hydronephrosis which urologist feels  is chronic and there is no obstruction. Repeat labs also shows worsening creatinine and elevation of LFTs.  Addendum - patients troponin came positive but patient has no chest pain. Probably from demand ischemia from sepsis and hypotension. I have ordered 2d echo, aspirin and will cycle cardiac markers. If patients PT/INR comes high then may have to discontinue aspirin. I have consulted cardiology.   DVT Prophylaxis SCDs for now in anticipation of possible procedures if there's any urinary tract obstruction.  Code Status: Full code as confirmed with patient. Family Communication: Discussed with patient.  Disposition Plan: Admit to inpatient.    KAKRAKANDY,ARSHAD N. [facility] Pager [phone].  If 7PM-7AM, please contact night-coverage [email or web address] Password [facility] 12/09/2014, 10:13 PM

## 2014-12-09 NOTE — ED Provider Notes (Signed)
CSN: [id number]     Arrival date & time 12/09/14  1742 History   First MD Initiated Contact with Patient 12/09/14 1804     Chief Complaint  Patient presents with  . Hallucinations  . foley cath      (Consider location/radiation/quality/duration/timing/severity/associated sxs/prior Treatment) HPI  Patient is a 81 year old male presenting today with high fever and altered mental status. Patient's had multiple Foleys placed last month. He has prostate cancer and a chronic Foley at this point. Most recent changesd on Friday by [facility]. He was driving home with his son from an appointment in [place] when he started to have shaking and altered mental status. No other complaints at this time.   Past Medical History  Diagnosis Date  . Basal cell carcinoma   . Laceration of arm 02/04/2013   Past Surgical History  Procedure Laterality Date  . Incision and drainage Left 02/04/2013    left arm laceration    Dr Doran Durand  .  sharpnel removal    1943    . I&d extremity Left 02/04/2013    Procedure: IRRIGATION AND DEBRIDEMENT Left Arm Laceration with Repair as Necessary;  Surgeon: Wylene Simmer, MD;  Location: [facility];  Service: Orthopedics;  Laterality: Left;  . Prostate biopsy N/A 10/02/2014    Procedure: PROSTATE ULTRASOUND AND BIOPSY ;  Surgeon: Irine Seal, MD;  Location: [facility];  Service: Urology;  Laterality: N/A;  . Cystoscopy N/A 10/02/2014    Procedure: FLEXIBLE CYSTOSCOPY;  Surgeon: Irine Seal, MD;  Location: [facility];  Service: Urology;  Laterality: N/A;   History reviewed. No pertinent family history. Social History  Substance Use Topics  . Smoking status: Never Smoker   . Smokeless tobacco: Never Used  . Alcohol Use: No    Review of Systems  Unable to perform ROS: Acuity of condition      Allergies  Review of patient's allergies indicates no known allergies.  Home Medications   Prior to Admission medications   Medication Sig Start Date End Date Taking? Authorizing  Provider  lidocaine (XYLOCAINE) 2 % jelly Place 1 application into the urethra once.   Yes Historical Provider, MD  aspirin 81 MG tablet Take 81 mg by mouth daily.    Historical Provider, MD  cholecalciferol (VITAMIN D) 1000 UNITS tablet Take 1,000 Units by mouth 2 (two) times daily.     Historical Provider, MD  Miconazole Nitrate 2 % AERO Apply 1 application topically daily as needed (athlete's foot).    Historical Provider, MD  Multiple Vitamins-Minerals (MULTIVITAMIN ADULT PO) Take 1 tablet by mouth daily.    Historical Provider, MD  polyethylene glycol (MIRALAX / GLYCOLAX) packet Take 17 g by mouth daily. Hold if develops diarrhea 10/03/14   Eugenie Filler, MD  senna-docusate (SENOKOT-S) 8.6-50 MG per tablet Take 1 tablet by mouth at bedtime. 10/03/14   Eugenie Filler, MD  tamsulosin (FLOMAX) 0.4 MG CAPS capsule Take 1 capsule (0.4 mg total) by mouth daily after supper. Start 3 days, prior to follow up with urology 10/10/14   Eugenie Filler, MD   BP 162/64 mmHg  Pulse 128  Temp(Src) 104.1 F (40.1 C) (Rectal)  Resp 26  Ht 5\' 3"  (1.6 m)  Wt 134 lb (60.782 kg)  BMI 23.74 kg/m2  SpO2 98% Physical Exam  Constitutional: He appears well-nourished.  HENT:  Head: Normocephalic.  Mouth/Throat: Oropharynx is clear and moist.  Eyes: Conjunctivae are normal.  Neck: No tracheal deviation present.  Cardiovascular: Normal rate.  Pulmonary/Chest: Effort normal. No stridor. No respiratory distress.  Abdominal: Soft. There is no tenderness. There is no guarding.  Genitourinary:  Catheter in place.  Musculoskeletal: Normal range of motion. He exhibits no edema.  Neurological: No cranial nerve deficit.  Skin: Skin is warm and dry. No rash noted. He is not diaphoretic.  Psychiatric: His behavior is normal.  Nursing note and vitals reviewed.   ED Course  Procedures (including critical care time) Labs Review Labs Reviewed  CULTURE, BLOOD (ROUTINE X 2)  CULTURE, BLOOD (ROUTINE X 2)   URINE CULTURE  COMPREHENSIVE METABOLIC PANEL  CBC WITH DIFFERENTIAL/PLATELET  URINALYSIS, ROUTINE W REFLEX MICROSCOPIC (NOT AT [facility])  I-STAT CG4 LACTIC ACID, ED    Imaging Review No results found. I have personally reviewed and evaluated these images and lab results as part of my medical decision-making.   EKG Interpretation None      MDM   Final diagnoses:  None    Patient is a 81 year old male with recent history of multiple Foley placements. He is presenting today with signs and symptoms of sepsis. Patient is febrile and tachycardic. We will give fluids, initiate code sepsis protocol. We'll treat for likely urologic cause of infection.  Patient's blood pressure is over 712 systolic therefore initiated with 1 L fluid. We'll reassess.  7:49 PM Culture urology. Zosyn does cover most recent urine culture that was sent by their office on Friday. We'll add azithromycin because of questionable atypical pneumonia seen on x-ray.  CRITICAL CARE Performed by: Gardiner Sleeper   Total critical care time: 30  Critical care time was exclusive of separately billable procedures and treating other patients.  Critical care was necessary to treat or prevent imminent or life-threatening deterioration.  Critical care was time spent personally by me on the following activities: development of treatment plan with patient and/or surrogate as well as nursing, discussions with consultants, evaluation of patient's response to treatment, examination of patient, obtaining history from patient or surrogate, ordering and performing treatments and interventions, ordering and review of laboratory studies, ordering and review of radiographic studies, pulse oximetry and re-evaluation of patient's condition.   Jaime Grizzell Julio Alm, MD 12/09/14 1949

## 2014-12-09 NOTE — ED Notes (Signed)
Attempted to call report, will call back. Nurse is in isolation room.

## 2014-12-10 ENCOUNTER — Inpatient Hospital Stay (HOSPITAL_COMMUNITY): Payer: Medicare Other

## 2014-12-10 ENCOUNTER — Encounter (HOSPITAL_COMMUNITY): Payer: Self-pay | Admitting: Physician Assistant

## 2014-12-10 DIAGNOSIS — N179 Acute kidney failure, unspecified: Secondary | ICD-10-CM

## 2014-12-10 DIAGNOSIS — I214 Non-ST elevation (NSTEMI) myocardial infarction: Secondary | ICD-10-CM

## 2014-12-10 DIAGNOSIS — I361 Nonrheumatic tricuspid (valve) insufficiency: Secondary | ICD-10-CM

## 2014-12-10 DIAGNOSIS — R6521 Severe sepsis with septic shock: Secondary | ICD-10-CM

## 2014-12-10 DIAGNOSIS — N170 Acute kidney failure with tubular necrosis: Secondary | ICD-10-CM

## 2014-12-10 DIAGNOSIS — I248 Other forms of acute ischemic heart disease: Secondary | ICD-10-CM

## 2014-12-10 LAB — PROTIME-INR
INR: 1.15 (ref 0.00–1.49)
Prothrombin Time: 14.8 seconds (ref 11.6–15.2)

## 2014-12-10 LAB — CBC WITH DIFFERENTIAL/PLATELET
BASOS ABS: 0 10*3/uL (ref 0.0–0.1)
BASOS PCT: 0 % (ref 0–1)
Basophils Absolute: 0 10*3/uL (ref 0.0–0.1)
Basophils Relative: 0 % (ref 0–1)
EOS ABS: 0 10*3/uL (ref 0.0–0.7)
Eosinophils Absolute: 0 10*3/uL (ref 0.0–0.7)
Eosinophils Relative: 0 % (ref 0–5)
Eosinophils Relative: 0 % (ref 0–5)
HCT: 27.9 % — ABNORMAL LOW (ref 39.0–52.0)
HEMATOCRIT: 27.4 % — AB (ref 39.0–52.0)
HEMOGLOBIN: 8.8 g/dL — AB (ref 13.0–17.0)
Hemoglobin: 8.8 g/dL — ABNORMAL LOW (ref 13.0–17.0)
LYMPHS ABS: 0.4 10*3/uL — AB (ref 0.7–4.0)
LYMPHS ABS: 0.6 10*3/uL — AB (ref 0.7–4.0)
Lymphocytes Relative: 3 % — ABNORMAL LOW (ref 12–46)
Lymphocytes Relative: 3 % — ABNORMAL LOW (ref 12–46)
MCH: 29.3 pg (ref 26.0–34.0)
MCH: 29.2 pg (ref 26.0–34.0)
MCHC: 32.1 g/dL (ref 30.0–36.0)
MCHC: 31.5 g/dL (ref 30.0–36.0)
MCV: 91.3 fL (ref 78.0–100.0)
MCV: 92.7 fL (ref 78.0–100.0)
MONO ABS: 0.6 10*3/uL (ref 0.1–1.0)
MONOS PCT: 4 % (ref 3–12)
Monocytes Absolute: 0.5 10*3/uL (ref 0.1–1.0)
Monocytes Relative: 3 % (ref 3–12)
NEUTROS ABS: 11.7 10*3/uL — AB (ref 1.7–7.7)
NEUTROS PCT: 93 % — AB (ref 43–77)
Neutro Abs: 17.6 10*3/uL — ABNORMAL HIGH (ref 1.7–7.7)
Neutrophils Relative %: 94 % — ABNORMAL HIGH (ref 43–77)
PLATELETS: 183 10*3/uL (ref 150–400)
Platelets: 181 10*3/uL (ref 150–400)
RBC: 3 MIL/uL — AB (ref 4.22–5.81)
RBC: 3.01 MIL/uL — AB (ref 4.22–5.81)
RDW: 13.9 % (ref 11.5–15.5)
RDW: 14.2 % (ref 11.5–15.5)
WBC: 12.7 10*3/uL — AB (ref 4.0–10.5)
WBC: 18.8 10*3/uL — AB (ref 4.0–10.5)

## 2014-12-10 LAB — BASIC METABOLIC PANEL
ANION GAP: 4 — AB (ref 5–15)
BUN: 44 mg/dL — ABNORMAL HIGH (ref 6–20)
CALCIUM: 7.2 mg/dL — AB (ref 8.9–10.3)
CO2: 20 mmol/L — ABNORMAL LOW (ref 22–32)
CREATININE: 1.72 mg/dL — AB (ref 0.61–1.24)
Chloride: 113 mmol/L — ABNORMAL HIGH (ref 101–111)
GFR, EST AFRICAN AMERICAN: 37 mL/min — AB (ref >60)
GFR, EST NON AFRICAN AMERICAN: 32 mL/min — AB (ref >60)
GLUCOSE: 105 mg/dL — AB (ref 65–99)
Potassium: 4.2 mmol/L (ref 3.5–5.1)
Sodium: 137 mmol/L (ref 135–145)

## 2014-12-10 LAB — URINALYSIS, ROUTINE W REFLEX MICROSCOPIC
BILIRUBIN URINE: NEGATIVE
Glucose, UA: NEGATIVE mg/dL
KETONES UR: NEGATIVE mg/dL
NITRITE: NEGATIVE
PROTEIN: 100 mg/dL — AB
Specific Gravity, Urine: 1.011 (ref 1.005–1.030)
UROBILINOGEN UA: 0.2 mg/dL (ref 0.0–1.0)
pH: 7.5 (ref 5.0–8.0)

## 2014-12-10 LAB — COMPREHENSIVE METABOLIC PANEL
ALT: 148 U/L — AB (ref 17–63)
ANION GAP: 9 (ref 5–15)
AST: 280 U/L — ABNORMAL HIGH (ref 15–41)
Albumin: 2.6 g/dL — ABNORMAL LOW (ref 3.5–5.0)
Alkaline Phosphatase: 65 U/L (ref 38–126)
BUN: 47 mg/dL — ABNORMAL HIGH (ref 6–20)
CHLORIDE: 106 mmol/L (ref 101–111)
CO2: 20 mmol/L — AB (ref 22–32)
CREATININE: 1.95 mg/dL — AB (ref 0.61–1.24)
Calcium: 7.6 mg/dL — ABNORMAL LOW (ref 8.9–10.3)
GFR, EST AFRICAN AMERICAN: 32 mL/min — AB (ref >60)
GFR, EST NON AFRICAN AMERICAN: 28 mL/min — AB (ref >60)
Glucose, Bld: 115 mg/dL — ABNORMAL HIGH (ref 65–99)
Potassium: 4.5 mmol/L (ref 3.5–5.1)
SODIUM: 135 mmol/L (ref 135–145)
Total Bilirubin: 0.7 mg/dL (ref 0.3–1.2)
Total Protein: 4.9 g/dL — ABNORMAL LOW (ref 6.5–8.1)

## 2014-12-10 LAB — STREP PNEUMONIAE URINARY ANTIGEN: Strep Pneumo Urinary Antigen: NEGATIVE

## 2014-12-10 LAB — TROPONIN I
TROPONIN I: 1.55 ng/mL — AB (ref <0.031)
TROPONIN I: 1.62 ng/mL — AB (ref <0.031)
Troponin I: 2.25 ng/mL (ref <0.031)
Troponin I: 2.87 ng/mL (ref <0.031)

## 2014-12-10 LAB — HEPATIC FUNCTION PANEL
ALK PHOS: 61 U/L (ref 38–126)
ALT: 129 U/L — ABNORMAL HIGH (ref 17–63)
AST: 199 U/L — ABNORMAL HIGH (ref 15–41)
Albumin: 2.6 g/dL — ABNORMAL LOW (ref 3.5–5.0)
BILIRUBIN INDIRECT: 0.6 mg/dL (ref 0.3–0.9)
BILIRUBIN TOTAL: 0.7 mg/dL (ref 0.3–1.2)
Bilirubin, Direct: 0.1 mg/dL (ref 0.1–0.5)
Total Protein: 5.1 g/dL — ABNORMAL LOW (ref 6.5–8.1)

## 2014-12-10 LAB — URINE MICROSCOPIC-ADD ON

## 2014-12-10 LAB — CREATININE, URINE, RANDOM: CREATININE, URINE: 28.83 mg/dL

## 2014-12-10 LAB — SODIUM, URINE, RANDOM: SODIUM UR: 32 mmol/L

## 2014-12-10 LAB — LACTIC ACID, PLASMA
LACTIC ACID, VENOUS: 2.1 mmol/L — AB (ref 0.5–2.0)
Lactic Acid, Venous: 1.6 mmol/L (ref 0.5–2.0)
Lactic Acid, Venous: 2.1 mmol/L (ref 0.5–2.0)

## 2014-12-10 LAB — MRSA PCR SCREENING: MRSA BY PCR: NEGATIVE

## 2014-12-10 MED ORDER — SODIUM CHLORIDE 0.9 % IV BOLUS (SEPSIS)
500.0000 mL | Freq: Once | INTRAVENOUS | Status: AC
  Administered 2014-12-10: 500 mL via INTRAVENOUS

## 2014-12-10 MED ORDER — HEPARIN BOLUS VIA INFUSION
2000.0000 [IU] | Freq: Once | INTRAVENOUS | Status: AC
  Administered 2014-12-10: 2000 [IU] via INTRAVENOUS
  Filled 2014-12-10: qty 2000

## 2014-12-10 MED ORDER — ASPIRIN 325 MG PO TABS
325.0000 mg | ORAL_TABLET | Freq: Every day | ORAL | Status: DC
  Administered 2014-12-10 – 2014-12-11 (×2): 325 mg via ORAL
  Filled 2014-12-10 (×2): qty 1

## 2014-12-10 MED ORDER — PHENYLEPHRINE HCL 10 MG/ML IJ SOLN
0.0000 ug/min | INTRAVENOUS | Status: DC
  Administered 2014-12-10 (×2): 40 ug/min via INTRAVENOUS
  Administered 2014-12-10: 20 ug/min via INTRAVENOUS
  Administered 2014-12-10 (×2): 40 ug/min via INTRAVENOUS
  Administered 2014-12-11: 30 ug/min via INTRAVENOUS
  Filled 2014-12-10 (×6): qty 1

## 2014-12-10 MED ORDER — LIDOCAINE HCL 2 % EX GEL
1.0000 "application " | Freq: Once | CUTANEOUS | Status: AC
  Administered 2014-12-10: 1 via URETHRAL
  Filled 2014-12-10: qty 5

## 2014-12-10 MED ORDER — BACITRACIN-NEOMYCIN-POLYMYXIN OINTMENT TUBE
TOPICAL_OINTMENT | CUTANEOUS | Status: DC | PRN
  Filled 2014-12-10: qty 15

## 2014-12-10 MED ORDER — HEPARIN (PORCINE) IN NACL 100-0.45 UNIT/ML-% IJ SOLN
750.0000 [IU]/h | INTRAMUSCULAR | Status: DC
  Administered 2014-12-10: 750 [IU]/h via INTRAVENOUS
  Filled 2014-12-10: qty 250

## 2014-12-10 MED ORDER — DEXTROSE 5 % IV SOLN: 500.0000 mg | INTRAVENOUS | Status: DC

## 2014-12-10 MED ORDER — PANTOPRAZOLE SODIUM 40 MG PO TBEC
40.0000 mg | DELAYED_RELEASE_TABLET | Freq: Every day | ORAL | Status: DC
Start: 2014-12-10 — End: 2014-12-12
  Administered 2014-12-10 – 2014-12-11 (×2): 40 mg via ORAL
  Filled 2014-12-10 (×2): qty 1

## 2014-12-10 MED ORDER — VANCOMYCIN HCL IN DEXTROSE 750-5 MG/150ML-% IV SOLN
750.0000 mg | INTRAVENOUS | Status: DC
  Filled 2014-12-10: qty 150

## 2014-12-10 MED ORDER — SODIUM CHLORIDE 0.9 % IV BOLUS (SEPSIS)
1000.0000 mL | Freq: Once | INTRAVENOUS | Status: AC
  Administered 2014-12-10: 1000 mL via INTRAVENOUS

## 2014-12-10 NOTE — Progress Notes (Signed)
  CRITICAL VALUE ALERT  Critical value received:  Positive blood cultures: gram - rods in both sets  Date of notification:  8/24  Time of notification:  0710  Critical value read back:Yes.    Nurse who received alert:  Avon Gully, RN  MD notified (1st page):  Rizwan  Time of first page:  [phone]

## 2014-12-10 NOTE — Progress Notes (Signed)
CRITICAL VALUE ALERT  Critical value received:  Troponin 2.25  Date of notification:  12/10/14  Time of notification: 1258  Critical value read back:Yes.    Nurse who received alert:  Avon Gully, RN  MD notified (1st page):  Dr. Wynelle Cleveland  Time of first page:  1300

## 2014-12-10 NOTE — Progress Notes (Signed)
  Echocardiogram 2D Echocardiogram has been performed.  Jennette Dubin 12/10/2014, 12:49 PM

## 2014-12-10 NOTE — Consult Note (Addendum)
CARDIOLOGY CONSULT NOTE   Patient ID: Anthony Nicholson MRN: [id number], DOB/AGE: 1917-05-22   Admit date: 12/09/2014 Date of Consult: 12/10/2014 Reason for Consult: Elevated Troponin  Primary Physician: Serita Grammes, CNM Primary Cardiologist: New  HPI: Anthony Nicholson is a 81 y.o. male with PMH of Prostate Cancer, urinary obstruction, and no significant cardiac history who presented to the [facility] ED on 12/09/14 for fever (temp 104.0) and altered mental status. He was found to also be tachycardiac and had a lactate of 4.07. He was admitted with Sepsis, with most likely source being a UTI.  Patient is alert and oriented to person, place, and time this morning. He appears to be a reliable historian at the time of this encounter. He reports feeling "under the weather" yesterday on his way back from a trip to [place] and that is why he came to the hospital. He is feeling better today and says his only complaint is experiencing pain in his genital region. He reports having used a catheter since earlier this month and has not experienced any complications from it until now. He is currently on Zosyn and Vancomycin being treated for a probable UTI.  When asked about any cardiac symptoms, he denies having experienced any chest pain, palpitations, shortness of breath, orthopnea, PND, or edema recently or over the past several months. He has never seen a cardiologist. He reports no history of MI's, CHF, or irregular rhythms. He denies any history of HTN, HLD, or other conditions. The only medical problem he mentions having is prostate cancer which was recently in June 2016.  An EKG was checked in the ED which showed sinus tachycardia with an incomplete RBBB. It was thought this was likely due to patient's high fever of 104.0 at that time. Repeat EKG on 12/10/14 shows NSR with rate in 80's and low-voltage QRS. No ST elevation or depression noted.  He reports no cardiac family history. Says his mother and  father passed away in their 46's with only known medical history of stroke and glaucoma. His brother lived to be 74 and the patient is unsure of his cardiac history.   He currently resides at an Assisted Living facility. Uses a cane for ambulation as needed.   Problem List  Past Medical History  Diagnosis Date  . Basal cell carcinoma   . Laceration of arm 02/04/2013  . HOH (hard of hearing)     Past Surgical History  Procedure Laterality Date  . Incision and drainage Left 02/04/2013    left arm laceration    Dr Doran Durand  .  sharpnel removal    1943    . I&d extremity Left 02/04/2013    Procedure: IRRIGATION AND DEBRIDEMENT Left Arm Laceration with Repair as Necessary;  Surgeon: Wylene Simmer, MD;  Location: [facility];  Service: Orthopedics;  Laterality: Left;  . Prostate biopsy N/A 10/02/2014    Procedure: PROSTATE ULTRASOUND AND BIOPSY ;  Surgeon: Irine Seal, MD;  Location: [facility];  Service: Urology;  Laterality: N/A;  . Cystoscopy N/A 10/02/2014    Procedure: FLEXIBLE CYSTOSCOPY;  Surgeon: Irine Seal, MD;  Location: [facility];  Service: Urology;  Laterality: N/A;     Allergies: No Known Allergies   Inpatient Medications  . aspirin  325 mg Oral Daily  . azithromycin  500 mg Intravenous Q24H  . pantoprazole  40 mg Oral Q1200  . piperacillin-tazobactam (ZOSYN)  IV  3.375 g Intravenous 3 times per day  . vancomycin  750 mg Intravenous Q24H  Family History Family History  Problem Relation Age of Onset  . Prostate cancer Neg Hx   . Stroke Father   . Glaucoma Mother      Social History Social History   Social History  . Marital Status: Single    Spouse Name: N/A  . Number of Children: N/A  . Years of Education: N/A   Occupational History  . Retired    Social History Main Topics  . Smoking status: Never Smoker   . Smokeless tobacco: Never Used  . Alcohol Use: No  . Drug Use: No  . Sexual Activity: No   Other Topics Concern  . Not on file   Social History Narrative    Currently lives in [place]. Uses cane.     Review of Systems  General: Positive for chills and fever. Negative for night sweats or weight changes.  Cardiovascular:  No chest pain, dyspnea on exertion, edema, orthopnea, palpitations, paroxysmal nocturnal dyspnea. Dermatological: No rash, lesions/masses Respiratory: No cough, dyspnea Urologic: No hematuria. Positive for dysuria. Abdominal:   No nausea, vomiting, diarrhea, bright red blood per rectum, melena, or hematemesis Neurologic:  No visual changes, wkns, changes in mental status. All other systems reviewed and are otherwise negative except as noted above.  Physical Exam  Blood pressure 93/39, pulse 81, temperature 98.2 F (36.8 C), temperature source Oral, resp. rate 23, height 5\' 8"  (1.727 m), weight 142 lb 13.7 oz (64.8 kg), SpO2 97 %.  General: Pleasant, elderly male in  NAD Psych: Normal affect. Neuro: Alert and oriented X 3. Moves all extremities spontaneously. HEENT: Normal  Neck: Supple without bruits or JVD. Lungs:  Resp regular and unlabored, Wheezing appreciated on auscultation. Heart: RRR no s3, s4, or murmurs. Abdomen: Soft, non-tender, non-distended, BS + x 4.  Extremities: No clubbing, cyanosis or edema. DP/PT/Radials 2+ and equal bilaterally.  Labs   Recent Labs  12/10/14 0510 12/10/14 0719  TROPONINI 1.55* 1.62*   Lab Results  Component Value Date   WBC 18.8* 12/10/2014   HGB 8.8* 12/10/2014   HCT 27.9* 12/10/2014   MCV 92.7 12/10/2014   PLT 183 12/10/2014     Recent Labs Lab 12/10/14 0719  NA 137  K 4.2  CL 113*  CO2 20*  BUN 44*  CREATININE 1.72*  CALCIUM 7.2*  PROT 5.1*  BILITOT 0.7  ALKPHOS 61  ALT 129*  AST 199*  GLUCOSE 105*    Radiology/Studies  US Renal: 12/10/2014   CLINICAL DATA:  Acute renal failure. Sepsis. Elevated BUN and creatinine.  EXAM: RENAL / URINARY TRACT ULTRASOUND COMPLETE  COMPARISON:  CT abdomen and pelvis 10/03/2014. Ultrasound kidneys  09/29/2014.  FINDINGS: Right Kidney:  Length: 10.3 cm. Diffuse parenchymal thinning consistent with medical renal disease. Moderate hydronephrosis, similar prior study.  Left Kidney:  Length: 9.5 cm. Diffuse parenchymal thinning consistent with medical renal disease. Mild hydronephrosis, similar to prior study.  Bladder:  Bladder is decompressed with a Foley catheter and is not evaluated.  IMPRESSION: Diffuse parenchymal thinning bilaterally consistent with medical renal disease. Bilateral hydronephrosis similar prior study.   Electronically Signed   By: Lucienne Capers M.D.   On: 12/10/2014 00:09   Dg Chest Port 1 View: 12/09/2014   CLINICAL DATA:  Patient with history of fever. History of basal cell carcinoma and prostate cancer.  EXAM: PORTABLE CHEST - 1 VIEW  COMPARISON:  None.  FINDINGS: Monitoring leads overlie the patient. Normal cardiac and mediastinal contours. No large area of pulmonary consolidation. Coarse  interstitial opacities. Biapical pleural parenchymal thickening. Low lung volumes. No pleural effusion or pneumothorax.  IMPRESSION: Interstitial opacities may represent component of atypical infection, chronic pulmonary changes or edema.  Biapical pleural parenchymal thickening.  Low lung volumes.   Electronically Signed   By: Lovey Newcomer M.D.   On: 12/09/2014 18:45    ECG: 12/10/14: NSR with rate in 80's. Low-voltage QRS.  ASSESSMENT AND PLAN  1. Elevated troponin likely secondary to demand ischemia in setting of sepsis  - Patient is without any known history of CAD or known risk factors. Has not experienced any chest pain or anginal equivalents. - troponin values have been 1.55 and 1.62 thus far. EKG without acute changes.    - will obtain fasting lipid panel for risk assessment. LFT's elevated so would not recommend starting statin at this point. - Echocardiogram is pending. Would likely not pursue ischemic workup due to no known risk factors, no recent or current anginal symptoms, and  his age and current DNR status. - will not add BB at this time due to low BP's in past 24 hours.  2. Sepsis - likely secondary to UTI - fever down from 104.1 to 97.8. WBC elevated to 18.8 on 12/10/14. - on abx - per IM  3. Prostate Cancer - followed by Urology  Otherwise, per IM.  Signed, Dineen Kid, PA-C 12/10/2014, 11:11 AM    The patient was seen, examined and discussed with Dineen Kid, PA-C and I agree with the above.    81 year old male admitted with sepsis sec to UTI with elevated troponin - in the settings of sepsis, acute on chronic kidney failure. The patient is asymptomatic with an ongoing infection and has DNR status. ECG shows no acute changes. Troponin continues to rise, I would follow until trending down. Start Heparin drip - very careful follow up with anemia Hb 8.8. We will follow echocardiogram. At this point I would recommend an aggressive medical management, no further ischemic workup unless the patient becomes symptomatic. Continue ASA, no BB or ACEI as he is hypotensive with sepsis, no atorvastatin as he has significant LFTs elevation.    Dorothy Spark 12/10/2014

## 2014-12-10 NOTE — Progress Notes (Signed)
CRITICAL VALUE ALERT  Critical value received:  Troponin 1.62, lactic 2.1  Date of notification:  12/10/14  Time of notification:  0838  Critical value read back:Yes.    Nurse who received alert:  Avon Gully, RN  MD notified (1st page):  Rizwan  Time of first page:  0840  Responding MD:  Wynelle Cleveland  Time MD responded:  [phone]

## 2014-12-10 NOTE — Consult Note (Signed)
PULMONARY / CRITICAL CARE MEDICINE   Name: Anthony Nicholson MRN: [id number] DOB: 1917-05-03    ADMISSION DATE:  12/09/2014 CONSULTATION DATE:  12/10/2014  REFERRING MD :  Dr. Hal Hope  CHIEF COMPLAINT:  UTI  INITIAL PRESENTATION: 81 year old male with stage IV adeno of the prostate. 8/24 he presented with fevers, and was admitted for presumed sepsis, suspected from chronic indwelling foley catheter, and placed on broad spectrum antibiotics. He became hypotensive despite volume resuscitation. PCCM to see.   STUDIES:  8/24 Renal US > Diffuse parenchymal thinning bilaterally consistent with medical renal disease. Bilateral hydronephrosis similar prior study.  SIGNIFICANT EVENTS: 6/13 - 6/17 > admission for obstructive uropathy/hydro, new dx prostate Ca found at that time.    HISTORY OF PRESENT ILLNESS:  81 year old male with PMH as below, which includes recent disagnosis [id number] Gleason 4+3 adenocarcinoma of the prostate currently managed on androgen deprivation therapy with Firmagon. Recently admitted for hydronephrosis secondary to obstructive uropathy. And since has had issues with acute urinary retention requiring management with a chronic indwelling foley catheter. He was most recently seen 12/05/14 at which point his foley catheter was exchanged uneventfully. At that time he was found to have pan sensitive E. Coli UTI and was started on Bactrim. 8/23 while coming home from an appointment he developed fevers/chills and presented to [facility] ED. He was found to be febrile with fever to 104F, and elevated lactic acid to 4. He was admitted for presumed sepsis related to chronic indwelling foley catheter and broad spectrum antibiotics were initiated. He developed hypotension that has been refractory to volume resuscitation. PCCM to see. Of note, lactic acid 1.6 was after 2 liters and before Neo started. Also began making urine at that point. At present he voices no complains with the exception of groin  pain related to catheter.   PAST MEDICAL HISTORY :   has a past medical history of Basal cell carcinoma and Laceration of arm (02/04/2013).  has past surgical history that includes Incision and drainage (Left, 02/04/2013);  sharpnel removal    1943; I&D extremity (Left, 02/04/2013); Prostate biopsy (N/A, 10/02/2014); and Cystoscopy (N/A, 10/02/2014). Prior to Admission medications   Medication Sig Start Date End Date Taking? Authorizing Provider  aspirin 81 MG tablet Take 81 mg by mouth daily.   Yes Historical Provider, MD  cholecalciferol (VITAMIN D) 1000 UNITS tablet Take 1,000 Units by mouth daily.    Yes Historical Provider, MD  degarelix (FIRMAGON) 80 MG injection Inject 80 mg into the skin every 28 (twenty-eight) days.   Yes Historical Provider, MD  lidocaine (XYLOCAINE) 2 % jelly Place 1 application into the urethra once.   Yes Historical Provider, MD  liver oil-zinc oxide (DESITIN) 40 % ointment Apply 1 application topically 2 (two) times daily. To sores on bottom.   Yes Historical Provider, MD  Miconazole Nitrate 2 % AERO Apply 1 application topically daily as needed (athlete's foot).   Yes Historical Provider, MD  Multiple Vitamins-Minerals (MULTIVITAMIN ADULT PO) Take 1 tablet by mouth daily.   Yes Historical Provider, MD  nystatin cream (MYCOSTATIN) Apply 1 application topically 2 (two) times daily. To sores on bottom.   Yes Historical Provider, MD  sulfamethoxazole-trimethoprim (BACTRIM DS,SEPTRA DS) 800-160 MG per tablet Take 0.5 tablets by mouth 2 (two) times daily.   Yes Historical Provider, MD  polyethylene glycol (MIRALAX / GLYCOLAX) packet Take 17 g by mouth daily. Hold if develops diarrhea Patient not taking: Reported on 12/09/2014 10/03/14   Irine Seal  V, MD  senna-docusate (SENOKOT-S) 8.6-50 MG per tablet Take 1 tablet by mouth at bedtime. Patient not taking: Reported on 12/09/2014 10/03/14   Eugenie Filler, MD  tamsulosin (FLOMAX) 0.4 MG CAPS capsule Take 1 capsule (0.4  mg total) by mouth daily after supper. Start 3 days, prior to follow up with urology 10/10/14   Eugenie Filler, MD   No Known Allergies  FAMILY HISTORY:  has no family status information on file.  SOCIAL HISTORY:  reports that he has never smoked. He has never used smokeless tobacco. He reports that he does not drink alcohol or use illicit drugs.  REVIEW OF SYSTEMS:     Bolds are positive  Constitutional: weight loss, gain, night sweats, Fevers, chills, fatigue .  HEENT: headaches, Sore throat, sneezing, nasal congestion, post nasal drip, Difficulty swallowing, Tooth/dental problems, visual complaints visual changes, ear ache CV:  chest pain, radiates: ,Orthopnea, PND, swelling in lower extremities, dizziness, palpitations, syncope.  GI  heartburn, indigestion, abdominal pain, nausea, vomiting, diarrhea, change in bowel habits, loss of appetite, bloody stools.  Resp: cough, productive: , hemoptysis, dyspnea, chest pain, pleuritic.  Skin: rash or itching or icterus GU: dysuria, change in color of urine, urgency or frequency. flank pain, hematuria  MS: joint pain or swelling. decreased range of motion  Psych: change in mood or affect. depression or anxiety.  Neuro: difficulty with speech, weakness, numbness, ataxia    SUBJECTIVE:   VITAL SIGNS: Temp:  [98.1 F (36.7 C)-104.1 F (40.1 C)] 98.8 F (37.1 C) (08/24 0000) Pulse Rate:  [86-140] 90 (08/24 0530) Resp:  [15-38] 19 (08/24 0530) BP: (87-162)/(27-75) 102/44 mmHg (08/24 0530) SpO2:  [91 %-100 %] 96 % (08/24 0530) Weight:  [60.782 kg (134 lb)-62.9 kg (138 lb 10.7 oz)] 62.9 kg (138 lb 10.7 oz) (08/23 2055) HEMODYNAMICS:   VENTILATOR SETTINGS:   INTAKE / OUTPUT:  Intake/Output Summary (Last 24 hours) at 12/10/14 0542 Last data filed at 12/10/14 0536  Gross per 24 hour  Intake 2784.5 ml  Output    815 ml  Net 1969.5 ml    PHYSICAL EXAMINATION: General:  Elderly male in NAD Neuro:  Alert, oriented, non-focal HEENT:   Bud/AT, no JVD, PERRL Cardiovascular:  RRR, no MRG Lungs:  Expiratory wheeze Abdomen:  Soft, non-tender, non-distended Musculoskeletal:  No acute deformity or edema Skin:  Grossly intact, dry mucous membranes  LABS:  CBC  Recent Labs Lab 12/09/14 1813 12/10/14 0140  WBC 6.2 12.7*  HGB 10.7* 8.8*  HCT 33.3* 27.4*  PLT 272 181   Coag's  Recent Labs Lab 12/09/14 2240  APTT 29  INR 1.11   BMET  Recent Labs Lab 12/09/14 1813 12/10/14 0140  NA 137 135  K 5.1 4.5  CL 103 106  CO2 20* 20*  BUN 43* 47*  CREATININE 1.64* 1.95*  GLUCOSE 122* 115*   Electrolytes  Recent Labs Lab 12/09/14 1813 12/10/14 0140  CALCIUM 8.8* 7.6*   Sepsis Markers  Recent Labs Lab 12/09/14 2240 12/10/14 0140 12/10/14 0420  LATICACIDVEN 1.4 2.1* 1.6  PROCALCITON 55.05  --   --    ABG No results for input(s): PHART, PCO2ART, PO2ART in the last 168 hours. Liver Enzymes  Recent Labs Lab 12/09/14 1813 12/10/14 0140  AST 36 280*  ALT 17 148*  ALKPHOS 77 65  BILITOT 0.6 0.7  ALBUMIN 3.7 2.6*     Total I/O In: 2784.5 [I.V.:1984.5; IV Piggyback:800] Out: 815 [Urine:815]   Cardiac Enzymes No results for input(s):  TROPONINI, PROBNP in the last 168 hours. Glucose No results for input(s): GLUCAP in the last 168 hours.  Imaging US Renal  12/10/2014   CLINICAL DATA:  Acute renal failure. Sepsis. Elevated BUN and creatinine.  EXAM: RENAL / URINARY TRACT ULTRASOUND COMPLETE  COMPARISON:  CT abdomen and pelvis 10/03/2014. Ultrasound kidneys 09/29/2014.  FINDINGS: Right Kidney:  Length: 10.3 cm. Diffuse parenchymal thinning consistent with medical renal disease. Moderate hydronephrosis, similar prior study.  Left Kidney:  Length: 9.5 cm. Diffuse parenchymal thinning consistent with medical renal disease. Mild hydronephrosis, similar to prior study.  Bladder:  Bladder is decompressed with a Foley catheter and is not evaluated.  IMPRESSION: Diffuse parenchymal thinning bilaterally  consistent with medical renal disease. Bilateral hydronephrosis similar prior study.   Electronically Signed   By: Lucienne Capers M.D.   On: 12/10/2014 00:09   Dg Chest Port 1 View  12/09/2014   CLINICAL DATA:  Patient with history of fever. History of basal cell carcinoma and prostate cancer.  EXAM: PORTABLE CHEST - 1 VIEW  COMPARISON:  None.  FINDINGS: Monitoring leads overlie the patient. Normal cardiac and mediastinal contours. No large area of pulmonary consolidation. Coarse interstitial opacities. Biapical pleural parenchymal thickening. Low lung volumes. No pleural effusion or pneumothorax.  IMPRESSION: Interstitial opacities may represent component of atypical infection, chronic pulmonary changes or edema.  Biapical pleural parenchymal thickening.  Low lung volumes.   Electronically Signed   By: Lovey Newcomer M.D.   On: 12/09/2014 18:45     ASSESSMENT / PLAN:  PULMONARY A: Diffuse pulmonary infiltrates  P:   Supplemental O2 as needed to keep SpO2 > 92% Follow CXR  CARDIOVASCULAR A:  Septic Shock > lactic normalized  P:  Tele MAP goal > 65 Continue IVF resuscitation, will gently bolus one additonal liter NS (4L total since admit) Neo for MAP goal > requiring very low dose, hoping will wean to off with more volume If unable to wean off pressors soon will need CVL Lactic cleared Continue trending troponin  RENAL A:   AKI (baseline SCr 1.15) Hydronephrosis (stable from previous renal US)  Chronic foley  P:   Hydrate Repeat BMP Making urine now since given volume Urology following  GASTROINTESTINAL A:   No acute issues  P:   Diet regular PPI  HEMATOLOGIC A:   Anemia of chronic illness Stage IV adenocarcinoma of prostate  P:  Follow CBC Transfuse per ICU guidelines  INFECTIOUS A:   Septic Shock, presumably urinary tract infection in setting of chronic foley Doubt HCAP  P:   BCx2 8/23 >>> UC 8/2 >>> Sputum 8/23 >>> Abx: Zosyn, start date 8/23  >>> Abx: Vancomycin, start date 8/23 >>> Abx: Azithromycin, start date 8/23 >>> Low threshold to dc azithro PCT algorithm  Fever control with acetaminophen   ENDOCRINE A:  Hyperglycemia without history of DM  P:   Monitor glucose on chemistries   NEUROLOGIC A:  No acute issues  P:   RASS goal: 0 Monitor   FAMILY  - Updates: Patient, 8/24 AM  - Inter-disciplinary family meet or Palliative Care meeting due by:  9/1    TODAY'S SUMMARY: 81 year old male, [employer] veteran, with stage IV adeno of prostate complicated urinary retention. He now has chronic indwelling foley catheter which is the likely source of his sepsis, UA and cultures pending. Continue broad spectrum antibiotics. Hypotension somewhat resolved with 2L IVF. Lactic cleared and UOP improved. BP still somewhat soft. Will give an additional liter of  NS over 2 hours. Mucous membranes appear dry. He is on low dose phenylephrine (54mcg). He is mentating well, and UOP has picked up a good bit. If this need for pressors increases, or is prolonged will consider CVL placement. Discussed plan with patient.   Georgann Housekeeper, AGACNP-BC [facility] Pulmonology/Critical Care Pager [phone] or [phone]  12/10/2014 5:45 AM

## 2014-12-10 NOTE — Progress Notes (Signed)
CRITICAL VALUE ALERT  Critical value received:  Troponin 2.82  Date of notification:  12/10/14  Time of notification:  3159  Critical value read back:Yes.    Nurse who received alert:  Avon Gully, RN  MD notified (1st page):  Dr. Wynelle Cleveland  Time of first page:  [phone]

## 2014-12-10 NOTE — Progress Notes (Signed)
CRITICAL VALUE ALERT  Critical value received:  Troponin 1.55  Date of notification:  12/10/14  Time of notification:  8786  Critical value read back:Yes.    Nurse who received alert:  Leola Brazil, RN  MD notified (1st page):  Dr. Hal Hope  Time of first page:  At bedside  MD notified (2nd page):  Time of second page:  Responding MD:  Dr. Earlean Polka  Time MD responded:  0600

## 2014-12-10 NOTE — Progress Notes (Signed)
[facility] Progress Note   Anthony Nicholson ZTI:[id number] DOB: 1917-11-23 DOA: 12/09/2014 PCP: Serita Grammes, CNM  Brief narrative: Anthony Nicholson is a 81 y.o. male with the recent diagnosis of prostate cancer stage IV on an androgen deprivation therapy and urinary obstruction with a Foley catheter who presents to the hospital for fever chills and confusion which started when driving home from his appointment and [place]. He was found to have a temperature of 104 in the ER and also complained of pain around the insertion of the Foley catheter. He maintained hypotensive despite IV fluids and therefore was started on pressors.  He had a recent Escherichia coli UTI   Subjective: No further penile pain. No nausea, dyspnea or chest pain. No fever or chills.   Assessment/Plan: Principal Problem: Septic shock/ lactic acidosis/leukocytosis/fever and UTI - Gram-negative rods and 2 sets of blood cultures- source likely UTI but urine culture still negative - noted to have interstitial opacities on CXR but not hypoxic and without a cough - will d/c Vanc and Zithromax- cont Zosyn - cont phenyephrine infusion- currently on 40 mcg- PCCM will place central line if he is not able to be weaned off soon - cont NS at 150 cc/hr  Active Problems:    Acute renal failure -Baseline creatinine is about 1.1-he presented with a creatinine of 1.64 which rose to 1.95 and is improving -Continue aggressive hydration and prevention of hypotension  Elevated TNI - TNI up to 2.25 now - cardiology called by admitting MD- they feel this is demand ischemia- no further work up  Chronic hydronephrosis -Bilateral- chronic-  likely secondary to outlet obstruction  Prostate cancer stage IV -Urology following as outpatient-Dr. Jeffie Pollock  Code Status: DO NOT RESUSCITATE Family Communication:  Disposition Plan: Follow in ICU DVT prophylaxis: SCDs Consultants: Pulmonary critical care, urology Procedures:    Antibiotics: Anti-infectives    Start     Dose/Rate Route Frequency Ordered Stop   12/10/14 2200  vancomycin (VANCOCIN) IVPB 750 mg/150 ml premix     750 mg 150 mL/hr over 60 Minutes Intravenous Every 24 hours 12/10/14 0159     12/10/14 1800  azithromycin (ZITHROMAX) 500 mg in dextrose 5 % 250 mL IVPB     500 mg 250 mL/hr over 60 Minutes Intravenous Every 24 hours 12/10/14 0418     12/10/14 0000  piperacillin-tazobactam (ZOSYN) IVPB 3.375 g     3.375 g 12.5 mL/hr over 240 Minutes Intravenous 3 times per day 12/09/14 2230     12/09/14 2230  vancomycin (VANCOCIN) IVPB 1000 mg/200 mL premix     1,000 mg 200 mL/hr over 60 Minutes Intravenous  Once 12/09/14 2223 12/09/14 2338   12/09/14 1915  azithromycin (ZITHROMAX) 500 mg in dextrose 5 % 250 mL IVPB     500 mg 250 mL/hr over 60 Minutes Intravenous  Once 12/09/14 1914 12/09/14 2033   12/09/14 1845  piperacillin-tazobactam (ZOSYN) IVPB 3.375 g     3.375 g 12.5 mL/hr over 240 Minutes Intravenous  Once 12/09/14 1835 12/09/14 1933      Objective: Filed Weights   12/09/14 1749 12/09/14 2055 12/10/14 0500  Weight: 60.782 kg (134 lb) 62.9 kg (138 lb 10.7 oz) 64.8 kg (142 lb 13.7 oz)    Intake/Output Summary (Last 24 hours) at 12/10/14 1230 Last data filed at 12/10/14 1100  Gross per 24 hour  Intake   5513 ml  Output   1440 ml  Net   4073 ml     Vitals Filed Vitals:  12/10/14 1045 12/10/14 1100 12/10/14 1115 12/10/14 1200  BP: 94/48 80/58 85/59    Pulse: 83 82 82   Temp:    97.4 F (36.3 C)  TempSrc:    Oral  Resp: 23 11 15    Height:      Weight:      SpO2: 100% 99% 97%     Exam:  General:  Pt is alert, not in acute distress  HEENT: No icterus, No thrush, oral mucosa moist  Cardiovascular: regular rate and rhythm, S1/S2 No murmur  Respiratory: clear to auscultation bilaterally   Abdomen: Soft, +Bowel sounds, non tender, non distended, no guarding  MSK: No LE edema, cyanosis or clubbing  Data Reviewed: Basic  Metabolic Panel:  Recent Labs Lab 12/09/14 1813 12/10/14 0140 12/10/14 0719  NA 137 135 137  K 5.1 4.5 4.2  CL 103 106 113*  CO2 20* 20* 20*  GLUCOSE 122* 115* 105*  BUN 43* 47* 44*  CREATININE 1.64* 1.95* 1.72*  CALCIUM 8.8* 7.6* 7.2*   Liver Function Tests:  Recent Labs Lab 12/09/14 1813 12/10/14 0140 12/10/14 0719  AST 36 280* 199*  ALT 17 148* 129*  ALKPHOS 77 65 61  BILITOT 0.6 0.7 0.7  PROT 6.8 4.9* 5.1*  ALBUMIN 3.7 2.6* 2.6*   No results for input(s): LIPASE, AMYLASE in the last 168 hours. No results for input(s): AMMONIA in the last 168 hours. CBC:  Recent Labs Lab 12/09/14 1813 12/10/14 0140 12/10/14 0719  WBC 6.2 12.7* 18.8*  NEUTROABS 5.8 11.7* 17.6*  HGB 10.7* 8.8* 8.8*  HCT 33.3* 27.4* 27.9*  MCV 91.2 91.3 92.7  PLT 272 181 183   Cardiac Enzymes:  Recent Labs Lab 12/10/14 0510 12/10/14 0719  TROPONINI 1.55* 1.62*   BNP (last 3 results) No results for input(s): BNP in the last 8760 hours.  ProBNP (last 3 results) No results for input(s): PROBNP in the last 8760 hours.  CBG: No results for input(s): GLUCAP in the last 168 hours.  Recent Results (from the past 240 hour(s))  Culture, blood (routine x 2)     Status: None (Preliminary result)   Collection Time: 12/09/14  6:00 PM  Result Value Ref Range Status   Specimen Description BLOOD RIGHT ANTECUBITAL  Final   Special Requests BOTTLES DRAWN AEROBIC AND ANAEROBIC 5CC  Final   Culture  Setup Time   Final    GRAM NEGATIVE RODS IN BOTH AEROBIC AND ANAEROBIC BOTTLES CRITICAL RESULT CALLED TO, READ BACK BY AND VERIFIED WITH: AKOONTZ 0713@08 /24/16 MKELLY    Culture   Final    GRAM NEGATIVE RODS Performed at [facility]    Report Status PENDING  Incomplete  Culture, blood (routine x 2)     Status: None (Preliminary result)   Collection Time: 12/09/14  6:07 PM  Result Value Ref Range Status   Specimen Description BLOOD RIGHT FOREARM  Final   Special Requests BOTTLES DRAWN  AEROBIC AND ANAEROBIC 5CC EACH  Final   Culture  Setup Time   Final    GRAM NEGATIVE RODS IN BOTH AEROBIC AND ANAEROBIC BOTTLES CRITICAL RESULT CALLED TO, READ BACK BY AND VERIFIED WITH: AKOONTZ 0713 @08 /24/16 MKELLY    Culture   Final    GRAM NEGATIVE RODS Performed at [facility]    Report Status PENDING  Incomplete  MRSA PCR Screening     Status: None   Collection Time: 12/09/14  9:14 PM  Result Value Ref Range Status   MRSA by PCR  NEGATIVE NEGATIVE Final    Comment:        The GeneXpert MRSA Assay (FDA approved for NASAL specimens only), is one component of a comprehensive MRSA colonization surveillance program. It is not intended to diagnose MRSA infection nor to guide or monitor treatment for MRSA infections.      Studies: US Renal  12/10/2014   CLINICAL DATA:  Acute renal failure. Sepsis. Elevated BUN and creatinine.  EXAM: RENAL / URINARY TRACT ULTRASOUND COMPLETE  COMPARISON:  CT abdomen and pelvis 10/03/2014. Ultrasound kidneys 09/29/2014.  FINDINGS: Right Kidney:  Length: 10.3 cm. Diffuse parenchymal thinning consistent with medical renal disease. Moderate hydronephrosis, similar prior study.  Left Kidney:  Length: 9.5 cm. Diffuse parenchymal thinning consistent with medical renal disease. Mild hydronephrosis, similar to prior study.  Bladder:  Bladder is decompressed with a Foley catheter and is not evaluated.  IMPRESSION: Diffuse parenchymal thinning bilaterally consistent with medical renal disease. Bilateral hydronephrosis similar prior study.   Electronically Signed   By: Lucienne Capers M.D.   On: 12/10/2014 00:09   Dg Chest Port 1 View  12/09/2014   CLINICAL DATA:  Patient with history of fever. History of basal cell carcinoma and prostate cancer.  EXAM: PORTABLE CHEST - 1 VIEW  COMPARISON:  None.  FINDINGS: Monitoring leads overlie the patient. Normal cardiac and mediastinal contours. No large area of pulmonary consolidation. Coarse interstitial  opacities. Biapical pleural parenchymal thickening. Low lung volumes. No pleural effusion or pneumothorax.  IMPRESSION: Interstitial opacities may represent component of atypical infection, chronic pulmonary changes or edema.  Biapical pleural parenchymal thickening.  Low lung volumes.   Electronically Signed   By: Lovey Newcomer M.D.   On: 12/09/2014 18:45    Scheduled Meds:  Scheduled Meds: . aspirin  325 mg Oral Daily  . azithromycin  500 mg Intravenous Q24H  . pantoprazole  40 mg Oral Q1200  . piperacillin-tazobactam (ZOSYN)  IV  3.375 g Intravenous 3 times per day  . vancomycin  750 mg Intravenous Q24H   Continuous Infusions: . sodium chloride 150 mL/hr at 12/10/14 0600  . phenylephrine (NEO-SYNEPHRINE) Adult infusion 40 mcg/min (12/10/14 0636)    Time spent on care of this patient: 72 min   [place], MD 12/10/2014, 12:30 PM  LOS: 1 day   [facility] Office  [phone] Pager - Text Page per [email or web address] If 7PM-7AM, please contact night-coverage [email or web address]

## 2014-12-10 NOTE — Progress Notes (Signed)
ANTIBIOTIC CONSULT NOTE - INITIAL  Pharmacy Consult for Vancomycin and Zosyn  Indication: Sepsis  No Known Allergies  Patient Measurements: Height: 5\' 8"  (172.7 cm) Weight: 138 lb 10.7 oz (62.9 kg) IBW/kg (Calculated) : 68.4 Adjusted Body Weight:   Vital Signs: Temp: 98.1 F (36.7 C) (08/23 2002) Temp Source: Oral (08/23 2002) BP: 87/36 mmHg (08/24 0135) Pulse Rate: 95 (08/24 0135) Intake/Output from previous day: 08/23 0701 - 08/24 0700 In: 1620 [I.V.:1370; IV Piggyback:250] Out: 325 [Urine:325] Intake/Output from this shift: Total I/O In: 1620 [I.V.:1370; IV Piggyback:250] Out: 325 [Urine:325]  Labs:  Recent Labs  12/09/14 1813  WBC 6.2  HGB 10.7*  PLT 272  CREATININE 1.64*   Estimated Creatinine Clearance: 24.5 mL/min (by C-G formula based on Cr of 1.64). No results for input(s): VANCOTROUGH, VANCOPEAK, VANCORANDOM, GENTTROUGH, GENTPEAK, GENTRANDOM, TOBRATROUGH, TOBRAPEAK, TOBRARND, AMIKACINPEAK, AMIKACINTROU, AMIKACIN in the last 72 hours.   Microbiology: Recent Results (from the past 720 hour(s))  Culture, blood (routine x 2)     Status: None (Preliminary result)   Collection Time: 12/09/14  6:07 PM  Result Value Ref Range Status   Specimen Description   Final    BLOOD RIGHT FOREARM Performed at [facility]    Special Requests BOTTLES DRAWN AEROBIC AND ANAEROBIC [facility]  Final   Culture PENDING  Incomplete   Report Status PENDING  Incomplete  MRSA PCR Screening     Status: None   Collection Time: 12/09/14  9:14 PM  Result Value Ref Range Status   MRSA by PCR NEGATIVE NEGATIVE Final    Comment:        The GeneXpert MRSA Assay (FDA approved for NASAL specimens only), is one component of a comprehensive MRSA colonization surveillance program. It is not intended to diagnose MRSA infection nor to guide or monitor treatment for MRSA infections.     Medical History: Past Medical History  Diagnosis Date  . Basal cell carcinoma   .  Laceration of arm 02/04/2013    Medications:  Anti-infectives    Start     Dose/Rate Route Frequency Ordered Stop   12/10/14 2200  vancomycin (VANCOCIN) IVPB 750 mg/150 ml premix     750 mg 150 mL/hr over 60 Minutes Intravenous Every 24 hours 12/10/14 0159     12/10/14 0000  piperacillin-tazobactam (ZOSYN) IVPB 3.375 g     3.375 g 12.5 mL/hr over 240 Minutes Intravenous 3 times per day 12/09/14 2230     12/09/14 2230  vancomycin (VANCOCIN) IVPB 1000 mg/200 mL premix     1,000 mg 200 mL/hr over 60 Minutes Intravenous  Once 12/09/14 2223 12/09/14 2338   12/09/14 1915  azithromycin (ZITHROMAX) 500 mg in dextrose 5 % 250 mL IVPB     500 mg 250 mL/hr over 60 Minutes Intravenous  Once 12/09/14 1914 12/09/14 2033   12/09/14 1845  piperacillin-tazobactam (ZOSYN) IVPB 3.375 g     3.375 g 12.5 mL/hr over 240 Minutes Intravenous  Once 12/09/14 1835 12/09/14 1933     Assessment: Patient with sepsis and acute renal failure.  CrCl currently ~21mL/min.  First dose of antibiotics already given.    Goal of Therapy:  Vancomycin trough level 15-20 mcg/ml  Zosyn based on renal function   Plan:  Measure antibiotic drug levels at steady state Follow up culture results Vancomycin 750mg  iv q24hr  Zosyn 3.375g IV Q8H infused over 4hrs.  Will follow renal function and adjust dosing as needed  Nani Skillern [place] 12/10/2014,1:59 AM

## 2014-12-10 NOTE — Care Management Note (Signed)
Case Management Note  Patient Details  Name: Anthony Nicholson MRN: [id number] Date of Birth: November 13, 1917  Subjective/Objective:            sepsis        Action/Plan:Date:  December 10, 2014 U.R. performed for needs and level of care. Will continue to follow for Case Management needs.  Velva Harman, RN, BSN, [place]   [phone]   Expected Discharge Date:   (unknown)               Expected Discharge Plan:  Home/Self Care  In-House Referral:  NA  Discharge planning Services  CM Consult  Post Acute Care Choice:  NA Choice offered to:  NA  DME Arranged:  N/A DME Agency:  NA  HH Arranged:  NA HH Agency:  NA  Status of Service:  Completed, signed off  Medicare Important Message Given:    Date Medicare IM Given:    Medicare IM give by:    Date Additional Medicare IM Given:    Additional Medicare Important Message give by:     If discussed at [place] of Stay Meetings, dates discussed:    Additional Comments:  Leeroy Cha, RN 12/10/2014, 10:29 AM

## 2014-12-10 NOTE — Progress Notes (Signed)
ANTICOAGULATION CONSULT NOTE - Initial Consult  Pharmacy Consult for heparin Indication: chest pain/ACS  No Known Allergies  Patient Measurements: Height: 5\' 8"  (172.7 cm) Weight: 142 lb 13.7 oz (64.8 kg) IBW/kg (Calculated) : 68.4 Heparin Dosing Weight: 65kg  Vital Signs: Temp: 97.4 F (36.3 C) (08/24 1200) Temp Source: Oral (08/24 1200) BP: 124/55 mmHg (08/24 1300) Pulse Rate: 83 (08/24 1300)  Labs:  Recent Labs  12/09/14 1813 12/09/14 2240 12/10/14 0140 12/10/14 0510 12/10/14 0719 12/10/14 1209  HGB 10.7*  --  8.8*  --  8.8*  --   HCT 33.3*  --  27.4*  --  27.9*  --   PLT 272  --  181  --  183  --   APTT  --  29  --   --   --   --   LABPROT  --  14.5  --   --  14.8  --   INR  --  1.11  --   --  1.15  --   CREATININE 1.64*  --  1.95*  --  1.72*  --   TROPONINI  --   --   --  1.55* 1.62* 2.25*    Estimated Creatinine Clearance: 24.1 mL/min (by C-G formula based on Cr of 1.72).   Medical History: Past Medical History  Diagnosis Date  . Basal cell carcinoma   . Laceration of arm 02/04/2013  . HOH (hard of hearing)    Assessment: 63 YOM presents with sepsis due to UTI.  Troponin are trending upward, patient does not complain of chest pain. EKG without acute changes per cardiology note.  Cardiology has asked pharmacy to dose heparin gtt for rule out ACS.  Today, 12/10/2014   Baseline aPTT and INR WNL  CBC low but stable  Renal: AKI  Goal of Therapy:  Heparin level 0.3-0.7 units/ml Monitor platelets by anticoagulation protocol: Yes   Plan:   Heparin 2000 unit bolus (redose dose for age) then 750 units/hr  Check 8h heparin level  Daily heparin level and CBC  Monitor for bleeding  Doreene Eland, PharmD, BCPS.   Pager: [phone] 12/10/2014,2:07 PM

## 2014-12-11 ENCOUNTER — Inpatient Hospital Stay (HOSPITAL_COMMUNITY): Payer: Medicare Other

## 2014-12-11 DIAGNOSIS — N390 Urinary tract infection, site not specified: Secondary | ICD-10-CM

## 2014-12-11 DIAGNOSIS — C61 Malignant neoplasm of prostate: Secondary | ICD-10-CM

## 2014-12-11 DIAGNOSIS — I248 Other forms of acute ischemic heart disease: Secondary | ICD-10-CM | POA: Insufficient documentation

## 2014-12-11 DIAGNOSIS — IMO0001 Reserved for inherently not codable concepts without codable children: Secondary | ICD-10-CM

## 2014-12-11 DIAGNOSIS — A419 Sepsis, unspecified organism: Secondary | ICD-10-CM

## 2014-12-11 LAB — BASIC METABOLIC PANEL
Anion gap: 5 (ref 5–15)
BUN: 35 mg/dL — ABNORMAL HIGH (ref 6–20)
CALCIUM: 7.1 mg/dL — AB (ref 8.9–10.3)
CO2: 20 mmol/L — ABNORMAL LOW (ref 22–32)
CREATININE: 1.41 mg/dL — AB (ref 0.61–1.24)
Chloride: 111 mmol/L (ref 101–111)
GFR, EST AFRICAN AMERICAN: 48 mL/min — AB (ref >60)
GFR, EST NON AFRICAN AMERICAN: 41 mL/min — AB (ref >60)
Glucose, Bld: 95 mg/dL (ref 65–99)
Potassium: 3.7 mmol/L (ref 3.5–5.1)
SODIUM: 136 mmol/L (ref 135–145)

## 2014-12-11 LAB — LIPID PANEL
CHOL/HDL RATIO: 2.8 ratio
Cholesterol: 111 mg/dL (ref 0–200)
HDL: 39 mg/dL — AB (ref >40)
LDL CALC: 60 mg/dL (ref 0–99)
Triglycerides: 58 mg/dL (ref <150)
VLDL: 12 mg/dL (ref 0–40)

## 2014-12-11 LAB — CBC
HCT: 26.7 % — ABNORMAL LOW (ref 39.0–52.0)
Hemoglobin: 8.7 g/dL — ABNORMAL LOW (ref 13.0–17.0)
MCH: 29.7 pg (ref 26.0–34.0)
MCHC: 32.6 g/dL (ref 30.0–36.0)
MCV: 91.1 fL (ref 78.0–100.0)
PLATELETS: 173 10*3/uL (ref 150–400)
RBC: 2.93 MIL/uL — ABNORMAL LOW (ref 4.22–5.81)
RDW: 14.4 % (ref 11.5–15.5)
WBC: 14.6 10*3/uL — AB (ref 4.0–10.5)

## 2014-12-11 LAB — HEPARIN LEVEL (UNFRACTIONATED)
Heparin Unfractionated: 0.19 IU/mL — ABNORMAL LOW (ref 0.30–0.70)
Heparin Unfractionated: 0.24 IU/mL — ABNORMAL LOW (ref 0.30–0.70)
Heparin Unfractionated: 0.35 IU/mL (ref 0.30–0.70)

## 2014-12-11 LAB — LEGIONELLA ANTIGEN, URINE

## 2014-12-11 MED ORDER — SODIUM CHLORIDE 0.9 % IV SOLN: INTRAVENOUS | Status: DC

## 2014-12-11 MED ORDER — ZOLPIDEM TARTRATE 5 MG PO TABS
5.0000 mg | ORAL_TABLET | Freq: Every evening | ORAL | Status: DC | PRN
  Administered 2014-12-11: 5 mg via ORAL
  Filled 2014-12-11: qty 1

## 2014-12-11 MED ORDER — HEPARIN (PORCINE) IN NACL 100-0.45 UNIT/ML-% IJ SOLN
1100.0000 [IU]/h | INTRAMUSCULAR | Status: DC
  Administered 2014-12-11 – 2014-12-12 (×2): 1100 [IU]/h via INTRAVENOUS
  Filled 2014-12-11 (×2): qty 250

## 2014-12-11 MED ORDER — ZOLPIDEM TARTRATE 5 MG PO TABS
5.0000 mg | ORAL_TABLET | Freq: Once | ORAL | Status: AC
  Administered 2014-12-11: 5 mg via ORAL
  Filled 2014-12-11: qty 1

## 2014-12-11 MED ORDER — HEPARIN (PORCINE) IN NACL 100-0.45 UNIT/ML-% IJ SOLN
950.0000 [IU]/h | INTRAMUSCULAR | Status: DC
  Filled 2014-12-11: qty 250

## 2014-12-11 MED ORDER — ASPIRIN EC 81 MG PO TBEC
81.0000 mg | DELAYED_RELEASE_TABLET | Freq: Every day | ORAL | Status: DC
  Administered 2014-12-12 – 2014-12-13 (×2): 81 mg via ORAL
  Filled 2014-12-11 (×2): qty 1

## 2014-12-11 MED ORDER — HEPARIN BOLUS VIA INFUSION
1000.0000 [IU] | Freq: Once | INTRAVENOUS | Status: AC
  Administered 2014-12-11: 1000 [IU] via INTRAVENOUS
  Filled 2014-12-11: qty 1000

## 2014-12-11 NOTE — Progress Notes (Signed)
ANTICOAGULATION CONSULT NOTE   Pharmacy Consult for heparin Indication: chest pain/ACS  No Known Allergies  Patient Measurements: Height: 5\' 8"  (172.7 cm) Weight: 152 lb 1.9 oz (69 kg) IBW/kg (Calculated) : 68.4 Heparin Dosing Weight: 65kg  Vital Signs: Temp: 97.6 F (36.4 C) (08/25 0800) Temp Source: Oral (08/25 0800) BP: 121/54 mmHg (08/25 1000) Pulse Rate: 83 (08/25 1000)  Labs:  Recent Labs  12/09/14 2240 12/10/14 0140  12/10/14 0719 12/10/14 1209 12/10/14 1800 12/10/14 2335 12/11/14 0330 12/11/14 0950  HGB  --  8.8*  --  8.8*  --   --   --  8.7*  --   HCT  --  27.4*  --  27.9*  --   --   --  26.7*  --   PLT  --  181  --  183  --   --   --  173  --   APTT 29  --   --   --   --   --   --   --   --   LABPROT 14.5  --   --  14.8  --   --   --   --   --   INR 1.11  --   --  1.15  --   --   --   --   --   HEPARINUNFRC  --   --   --   --   --   --  0.19*  --  0.24*  CREATININE  --  1.95*  --  1.72*  --   --   --  1.41*  --   TROPONINI  --   --   < > 1.62* 2.25* 2.87*  --   --   --   < > = values in this interval not displayed.  Estimated Creatinine Clearance: 31 mL/min (by C-G formula based on Cr of 1.41).   Medical History: Past Medical History  Diagnosis Date  . Basal cell carcinoma   . Laceration of arm 02/04/2013  . HOH (hard of hearing)    Assessment: 11 YOM presents with sepsis due to UTI.  Troponin are trending upward, patient does not complain of chest pain. EKG without acute changes per cardiology note.  Cardiology has asked pharmacy to dose heparin gtt for rule out ACS.  Today, 12/11/2014   First two heparin levels have been subtherapeutic  Baseline aPTT and INR WNL  CBC: Hgb low but stable and pltc WNL  Renal: AKI - improving  Goal of Therapy:  Heparin level 0.3-0.7 units/ml Monitor platelets by anticoagulation protocol: Yes   Plan:   Increase Heparin to 1100 units/hr  Cardiology may stop heparin gtt, awaiting MD to see  Check 8h  heparin level  Daily heparin level and CBC  Monitor for bleeding  Doreene Eland, PharmD, BCPS.   Pager: [phone] 12/11/2014,11:19 AM

## 2014-12-11 NOTE — Progress Notes (Signed)
Patient ID: Anthony Nicholson, male   DOB: February 13, 1918, 81 y.o.   MRN: [id number]    Subjective: Anthony Nicholson is doing well on current therapy with a declining WBC and no fever.  He is sleeping at this time.  ROS:  Review of Systems  Unable to perform ROS: other    Anti-infectives: Anti-infectives    Start     Dose/Rate Route Frequency Ordered Stop   12/10/14 2200  vancomycin (VANCOCIN) IVPB 750 mg/150 ml premix  Status:  Discontinued     750 mg 150 mL/hr over 60 Minutes Intravenous Every 24 hours 12/10/14 0159 12/10/14 1445   12/10/14 1800  azithromycin (ZITHROMAX) 500 mg in dextrose 5 % 250 mL IVPB  Status:  Discontinued     500 mg 250 mL/hr over 60 Minutes Intravenous Every 24 hours 12/10/14 0418 12/10/14 1451   12/10/14 0000  piperacillin-tazobactam (ZOSYN) IVPB 3.375 g     3.375 g 12.5 mL/hr over 240 Minutes Intravenous 3 times per day 12/09/14 2230     12/09/14 2230  vancomycin (VANCOCIN) IVPB 1000 mg/200 mL premix     1,000 mg 200 mL/hr over 60 Minutes Intravenous  Once 12/09/14 2223 12/09/14 2338   12/09/14 1915  azithromycin (ZITHROMAX) 500 mg in dextrose 5 % 250 mL IVPB     500 mg 250 mL/hr over 60 Minutes Intravenous  Once 12/09/14 1914 12/09/14 2033   12/09/14 1845  piperacillin-tazobactam (ZOSYN) IVPB 3.375 g     3.375 g 12.5 mL/hr over 240 Minutes Intravenous  Once 12/09/14 1835 12/09/14 1933      Current Facility-Administered Medications  Medication Dose Route Frequency Provider Last Rate Last Dose  . acetaminophen (TYLENOL) tablet 650 mg  650 mg Oral Q6H PRN Rise Patience, MD   650 mg at 12/09/14 2331   Or  . acetaminophen (TYLENOL) suppository 650 mg  650 mg Rectal Q6H PRN Rise Patience, MD      . Derrill Memo ON 12/12/2014] aspirin EC tablet 81 mg  81 mg Oral Daily Dayna N Dunn, PA-C      . heparin ADULT infusion 100 units/mL (25000 units/250 mL)  1,100 Units/hr Intravenous Continuous Berton Mount, RPH 11 mL/hr at 12/11/14 1455 1,100 Units/hr at 12/11/14 1455   . morphine 2 MG/ML injection 0.5 mg  0.5 mg Intravenous Q4H PRN Rise Patience, MD      . neomycin-bacitracin-polymyxin (NEOSPORIN) ointment   Topical PRN Star Age, MD      . ondansetron [facility]) tablet 4 mg  4 mg Oral Q6H PRN Rise Patience, MD       Or  . ondansetron [facility]) injection 4 mg  4 mg Intravenous Q6H PRN Rise Patience, MD      . pantoprazole (PROTONIX) EC tablet 40 mg  40 mg Oral Q1200 Corey Harold, NP   40 mg at 12/11/14 1258  . piperacillin-tazobactam (ZOSYN) IVPB 3.375 g  3.375 g Intravenous 3 times per day Rise Patience, MD   3.375 g at 12/11/14 1337     Objective: Vital signs in last 24 hours: Temp:  [97.6 F (36.4 C)-98.5 F (36.9 C)] 97.6 F (36.4 C) (08/25 0800) Pulse Rate:  [66-92] 66 (08/25 1200) Resp:  [10-34] 18 (08/25 1200) BP: (65-154)/(34-122) 107/34 mmHg (08/25 1200) SpO2:  [87 %-100 %] 96 % (08/25 1200) Weight:  [69 kg (152 lb 1.9 oz)] 69 kg (152 lb 1.9 oz) (08/25 0500)  Intake/Output from previous day: 08/24 0701 - 08/25 0700 In:  5585.2 [I.V.:4935.2; IV Piggyback:650] Out: 2426 [Urine:2450] Intake/Output this shift: Total I/O In: 781.1 [I.V.:731.1; IV Piggyback:50] Out: 1150 [Urine:1150]   Physical Exam  Lab Results:   Recent Labs  12/10/14 0719 12/11/14 0330  WBC 18.8* 14.6*  HGB 8.8* 8.7*  HCT 27.9* 26.7*  PLT 183 173   BMET  Recent Labs  12/10/14 0719 12/11/14 0330  NA 137 136  K 4.2 3.7  CL 113* 111  CO2 20* 20*  GLUCOSE 105* 95  BUN 44* 35*  CREATININE 1.72* 1.41*  CALCIUM 7.2* 7.1*   PT/INR  Recent Labs  12/09/14 2240 12/10/14 0719  LABPROT 14.5 14.8  INR 1.11 1.15   ABG No results for input(s): PHART, HCO3 in the last 72 hours.  Invalid input(s): PCO2, PO2  Studies/Results: US Renal  12/10/2014   CLINICAL DATA:  Acute renal failure. Sepsis. Elevated BUN and creatinine.  EXAM: RENAL / URINARY TRACT ULTRASOUND COMPLETE  COMPARISON:  CT abdomen and pelvis 10/03/2014.  Ultrasound kidneys 09/29/2014.  FINDINGS: Right Kidney:  Length: 10.3 cm. Diffuse parenchymal thinning consistent with medical renal disease. Moderate hydronephrosis, similar prior study.  Left Kidney:  Length: 9.5 cm. Diffuse parenchymal thinning consistent with medical renal disease. Mild hydronephrosis, similar to prior study.  Bladder:  Bladder is decompressed with a Foley catheter and is not evaluated.  IMPRESSION: Diffuse parenchymal thinning bilaterally consistent with medical renal disease. Bilateral hydronephrosis similar prior study.   Electronically Signed   By: Lucienne Capers M.D.   On: 12/10/2014 00:09   Dg Chest Port 1 View  12/11/2014   CLINICAL DATA:  Pulmonary infiltrates.  EXAM: PORTABLE CHEST - 1 VIEW  COMPARISON:  12/09/2014.  FINDINGS: Patient is rotated to the right. Heart size normal. Diffuse bilateral pulmonary infiltrates consistent with bilateral pulmonary edema and/or pneumonia. Small bilateral effusions. No pneumothorax. No acute bony abnormality. Degenerative changes thoracic spine.  IMPRESSION: New onset of diffuse bilateral pulmonary alveolar infiltrates consistent with pulmonary edema and/or bilateral pneumonia. Small bilateral pleural effusions are also present.   Electronically Signed   By: Marcello Moores  Register   On: 12/11/2014 07:14   Dg Chest Port 1 View  12/09/2014   CLINICAL DATA:  Patient with history of fever. History of basal cell carcinoma and prostate cancer.  EXAM: PORTABLE CHEST - 1 VIEW  COMPARISON:  None.  FINDINGS: Monitoring leads overlie the patient. Normal cardiac and mediastinal contours. No large area of pulmonary consolidation. Coarse interstitial opacities. Biapical pleural parenchymal thickening. Low lung volumes. No pleural effusion or pneumothorax.  IMPRESSION: Interstitial opacities may represent component of atypical infection, chronic pulmonary changes or edema.  Biapical pleural parenchymal thickening.  Low lung volumes.   Electronically Signed   By:  Lovey Newcomer M.D.   On: 12/09/2014 18:45     Assessment and Plan: He is improving clinically from the sepsis.  His cultures are pending.    He has retention related to his prostate cancer and is otherwise doing well with the foley.   He has urodynamics scheduled for 8/31 in our office to assess his bladder function and determine whether he will need to continue catheter drainage or whether he might benefit from a channel TURP if he fails to void at the time of the study.    It would be best to keep him on culture appropriate supressive antibiotics post discharge until he has the urodynamics done.       LOS: 2 days    Malka So 12/11/2014 [phone]

## 2014-12-11 NOTE — Progress Notes (Signed)
PULMONARY / CRITICAL CARE MEDICINE   Name: Anthony Nicholson MRN: [id number] DOB: Dec 27, 1917    ADMISSION DATE:  12/09/2014 CONSULTATION DATE:  12/10/2014  REFERRING MD :  Dr. Hal Hope  CHIEF COMPLAINT:  UTI  INITIAL PRESENTATION: 81 year old male with stage IV adeno of the prostate. 8/24 he presented with fevers, and was admitted for presumed sepsis, suspected from chronic indwelling foley catheter, and placed on broad spectrum antibiotics. He became hypotensive despite volume resuscitation. PCCM to see.   STUDIES:  8/24 Renal US > Diffuse parenchymal thinning bilaterally consistent with medical renal disease. Bilateral hydronephrosis similar prior study.  SIGNIFICANT EVENTS: 6/13 - 6/17 > admission for obstructive uropathy/hydro, new dx prostate Ca found at that time.  8/23  Admit with suspected sepsis from urinary source 8/25  Weaned off vasopressors     SUBJECTIVE: Pt reports he didn't sleep well last night - "they were in my room every 2 hours". Off neo gtt.  He states he has not made any decisions regarding further procedures (perc neph etc)  VITAL SIGNS: Temp:  [97.4 F (36.3 C)-98.5 F (36.9 C)] 97.6 F (36.4 C) (08/25 0800) Pulse Rate:  [35-101] 76 (08/25 0840) Resp:  [10-34] 19 (08/25 0840) BP: (65-154)/(38-122) 111/41 mmHg (08/25 0840) SpO2:  [87 %-100 %] 99 % (08/25 0840) Weight:  [152 lb 1.9 oz (69 kg)] 152 lb 1.9 oz (69 kg) (08/25 0500)   INTAKE / OUTPUT:  Intake/Output Summary (Last 24 hours) at 12/11/14 0925 Last data filed at 12/11/14 0758  Gross per 24 hour  Intake 4719.2 ml  Output   2200 ml  Net 2519.2 ml    PHYSICAL EXAMINATION: General:  Elderly male in NAD Neuro:  Alert, oriented, non-focal HEENT:  [place]/AT, no JVD, PERRL Cardiovascular:  RRR, no MRG Lungs:  resp's even/non-labored, lungs bilaterally with good air movement, no wheezing Abdomen:  Soft, non-tender, non-distended Musculoskeletal:  No acute deformity or edema Skin:  Grossly intact,  dry mucous membranes  LABS:  CBC  Recent Labs Lab 12/10/14 0140 12/10/14 0719 12/11/14 0330  WBC 12.7* 18.8* 14.6*  HGB 8.8* 8.8* 8.7*  HCT 27.4* 27.9* 26.7*  PLT 181 183 173   Coag's  Recent Labs Lab 12/09/14 2240 12/10/14 0719  APTT 29  --   INR 1.11 1.15   BMET  Recent Labs Lab 12/10/14 0140 12/10/14 0719 12/11/14 0330  NA 135 137 136  K 4.5 4.2 3.7  CL 106 113* 111  CO2 20* 20* 20*  BUN 47* 44* 35*  CREATININE 1.95* 1.72* 1.41*  GLUCOSE 115* 105* 95   Electrolytes  Recent Labs Lab 12/10/14 0140 12/10/14 0719 12/11/14 0330  CALCIUM 7.6* 7.2* 7.1*   Sepsis Markers  Recent Labs Lab 12/09/14 2240 12/10/14 0140 12/10/14 0420 12/10/14 0719  LATICACIDVEN 1.4 2.1* 1.6 2.1*  PROCALCITON 55.05  --   --   --    ABG No results for input(s): PHART, PCO2ART, PO2ART in the last 168 hours.   Liver Enzymes  Recent Labs Lab 12/09/14 1813 12/10/14 0140 12/10/14 0719  AST 36 280* 199*  ALT 17 148* 129*  ALKPHOS 77 65 61  BILITOT 0.6 0.7 0.7  ALBUMIN 3.7 2.6* 2.6*   I/O last 3 completed shifts: In: 9758.2 [I.V.:7308.2; IV Piggyback:2450] Out: 9326 [Urine:3415] Total I/O In: 54 [I.V.:54] Out: -   Cardiac Enzymes  Recent Labs Lab 12/10/14 0719 12/10/14 1209 12/10/14 1800  TROPONINI 1.62* 2.25* 2.87*   Glucose No results for input(s): GLUCAP in the last  168 hours.  Imaging Dg Chest Port 1 View  12/11/2014   CLINICAL DATA:  Pulmonary infiltrates.  EXAM: PORTABLE CHEST - 1 VIEW  COMPARISON:  12/09/2014.  FINDINGS: Patient is rotated to the right. Heart size normal. Diffuse bilateral pulmonary infiltrates consistent with bilateral pulmonary edema and/or pneumonia. Small bilateral effusions. No pneumothorax. No acute bony abnormality. Degenerative changes thoracic spine.  IMPRESSION: New onset of diffuse bilateral pulmonary alveolar infiltrates consistent with pulmonary edema and/or bilateral pneumonia. Small bilateral pleural effusions are  also present.   Electronically Signed   By: Marcello Moores  Register   On: 12/11/2014 07:14     ASSESSMENT / PLAN:  PULMONARY A: Diffuse pulmonary infiltrates - likely edema  P:   Supplemental O2 as needed to keep SpO2 > 92% Follow intermittent CXR   CARDIOVASCULAR A:  Septic Shock - lactic normalized Elevated Troponin - likely demand ischemia in the setting of sepsis / anemia  CAD P:  Tele/SDU monitoring Vasopressors weaned off 8/25 Heparin gtt, ? Duration Cardiology following, not likely to pursue ischemic work up in setting of limited risk factors  RENAL A:   AKI (baseline SCr 1.15) Hydronephrosis (stable from previous renal US)  Chronic foley P:   Urology following Trend BMP / UOP Replace electrolytes as indicated   GASTROINTESTINAL A:   No acute issues P:   Diet regular PPI  HEMATOLOGIC A:   Anemia of chronic illness Stage IV adenocarcinoma of prostate P:  Follow CBC Transfuse per ICU guidelines  INFECTIOUS A:   Septic Shock, presumably urinary tract infection in setting of chronic foley GNR Bacteremia -  Doubt HCAP P:   BCx2 8/23 >> GNR >>  UC 8/24 >>  Zosyn, start date 8/23 >> Vancomycin, start date 8/23 >> 8/24 Azithromycin, start date 8/23 >> 8/24  PCT algorithm  Monitor fever curve / leukocytosis   ENDOCRINE A:  Hyperglycemia without history of DM P:   Monitor glucose on chemistries   NEUROLOGIC A:  No acute issues P:   RASS goal: 0 Monitor for ICU associated delirium in frail elderly   FAMILY  - Updates: Patient, 8/25 AM  - Inter-disciplinary family meet or Palliative Care meeting due by:  9/1  PCCM will sign off.  Please call back if we can be of further assistance.    Noe Gens, NP-C [facility] Pgr: [phone] or if no answer [phone] 12/11/2014, 9:25 AM

## 2014-12-11 NOTE — Progress Notes (Signed)
Patient: Anthony Nicholson / Admit Date: 12/09/2014 / Date of Encounter: 12/11/2014, 9:05 AM   Subjective: Denies any CP or SOB. Good appetite.   Objective: Telemetry: NSR, occasional artifact Physical Exam: Blood pressure 111/41, pulse 76, temperature 97.6 F (36.4 C), temperature source Oral, resp. rate 19, height 5\' 8"  (1.727 m), weight 152 lb 1.9 oz (69 kg), SpO2 99 %. General: Well developed elderly WM in no acute distress. Head: Normocephalic, atraumatic, sclera non-icteric, no xanthomas, nares are without discharge. Neck:  JVP not elevated. Lungs: Occasional expiratory wheezing, otherwise no rales or rhonchi. Breathing is unlabored. Heart: RRR S1 S2 without murmurs, rubs, or gallops.  Abdomen: Soft, non-tender, non-distended with normoactive bowel sounds. No rebound/guarding. Extremities: No clubbing or cyanosis. No edema. Distal pedal pulses are 2+ and equal bilaterally. Neuro: Alert and oriented X 3. Moves all extremities spontaneously. Hard of hearing Psych:  Responds to questions appropriately with a normal affect.    Intake/Output Summary (Last 24 hours) at 12/11/14 0905 Last data filed at 12/11/14 0758  Gross per 24 hour  Intake 5219.2 ml  Output   2200 ml  Net 3019.2 ml    Inpatient Medications:  . aspirin  325 mg Oral Daily  . pantoprazole  40 mg Oral Q1200  . piperacillin-tazobactam (ZOSYN)  IV  3.375 g Intravenous 3 times per day   Infusions:  . sodium chloride 150 mL/hr at 12/11/14 0155  . heparin 950 Units/hr (12/11/14 0700)  . phenylephrine (NEO-SYNEPHRINE) Adult infusion Stopped (12/11/14 0758)    Labs:  Recent Labs  12/10/14 0719 12/11/14 0330  NA 137 136  K 4.2 3.7  CL 113* 111  CO2 20* 20*  GLUCOSE 105* 95  BUN 44* 35*  CREATININE 1.72* 1.41*  CALCIUM 7.2* 7.1*    Recent Labs  12/10/14 0140 12/10/14 0719  AST 280* 199*  ALT 148* 129*  ALKPHOS 65 61  BILITOT 0.7 0.7  PROT 4.9* 5.1*  ALBUMIN 2.6* 2.6*    Recent Labs   12/10/14 0140 12/10/14 0719 12/11/14 0330  WBC 12.7* 18.8* 14.6*  NEUTROABS 11.7* 17.6*  --   HGB 8.8* 8.8* 8.7*  HCT 27.4* 27.9* 26.7*  MCV 91.3 92.7 91.1  PLT 181 183 173    Recent Labs  12/10/14 0510 12/10/14 0719 12/10/14 1209 12/10/14 1800  TROPONINI 1.55* 1.62* 2.25* 2.87*   Radiology/Studies:  US Renal  12/10/2014   CLINICAL DATA:  Acute renal failure. Sepsis. Elevated BUN and creatinine.  EXAM: RENAL / URINARY TRACT ULTRASOUND COMPLETE  COMPARISON:  CT abdomen and pelvis 10/03/2014. Ultrasound kidneys 09/29/2014.  FINDINGS: Right Kidney:  Length: 10.3 cm. Diffuse parenchymal thinning consistent with medical renal disease. Moderate hydronephrosis, similar prior study.  Left Kidney:  Length: 9.5 cm. Diffuse parenchymal thinning consistent with medical renal disease. Mild hydronephrosis, similar to prior study.  Bladder:  Bladder is decompressed with a Foley catheter and is not evaluated.  IMPRESSION: Diffuse parenchymal thinning bilaterally consistent with medical renal disease. Bilateral hydronephrosis similar prior study.   Electronically Signed   By: Lucienne Capers M.D.   On: 12/10/2014 00:09   Dg Chest Port 1 View  12/11/2014   CLINICAL DATA:  Pulmonary infiltrates.  EXAM: PORTABLE CHEST - 1 VIEW  COMPARISON:  12/09/2014.  FINDINGS: Patient is rotated to the right. Heart size normal. Diffuse bilateral pulmonary infiltrates consistent with bilateral pulmonary edema and/or pneumonia. Small bilateral effusions. No pneumothorax. No acute bony abnormality. Degenerative changes thoracic spine.  IMPRESSION: New onset of diffuse bilateral pulmonary alveolar  infiltrates consistent with pulmonary edema and/or bilateral pneumonia. Small bilateral pleural effusions are also present.   Electronically Signed   By: Marcello Moores  Register   On: 12/11/2014 07:14   Dg Chest Port 1 View  12/09/2014   CLINICAL DATA:  Patient with history of fever. History of basal cell carcinoma and prostate cancer.   EXAM: PORTABLE CHEST - 1 VIEW  COMPARISON:  None.  FINDINGS: Monitoring leads overlie the patient. Normal cardiac and mediastinal contours. No large area of pulmonary consolidation. Coarse interstitial opacities. Biapical pleural parenchymal thickening. Low lung volumes. No pleural effusion or pneumothorax.  IMPRESSION: Interstitial opacities may represent component of atypical infection, chronic pulmonary changes or edema.  Biapical pleural parenchymal thickening.  Low lung volumes.   Electronically Signed   By: Lovey Newcomer M.D.   On: 12/09/2014 18:45     Assessment and Plan  79M with prostate CA, CKD (including h/o ARF 2/2 urinary retention in 09/2014), anemia admitted with sepsis likely due to UTI, found to have elevated troponin.   1. Elevated troponin likely secondary to demand ischemia in setting of sepsis/anemia - Patient is without any known history of CAD; only known RF is age. Has not experienced any chest pain or anginal equivalents. - EKG without acute changes -> troponin peak 2.87 thus far. F/u level in AM to assess downtrend. - hold off statin due to transaminitis (LDL 60) - will not add BB at this time due to intermittent low BP - decrease ASA due to anemia - will discuss duration of heparin with cardiology MD.  - 2D echo 12/10/14: mild LVH, EF 60-65%, no RWMA, grade 1 DD, mild TR. Would likely not pursue ischemic workup due to limited risk factors, no recent or current anginal symptoms, age, comorbidities below and current DNR status  2. Sepsis likely secondary to UTI - fever down from 104.1 to 97.8. WBC improving - on abx - per IM  3. 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 Acute on chronic anemia - per IM - reduce ASA to 81mg  daily  5. Prostate Cancer - followed by Urology  Signed, Melina Copa PA-C Pager: [phone]   The patient was seen, examined and discussed with Melina Copa, PA-C and I agree with the above.   81 year old male admitted with sepsis sec to  UTI with elevated troponin - in the settings of sepsis, acute on chronic kidney failure. The patient is asymptomatic with an ongoing infection and has DNR status. ECG shows no acute changes. Troponin continues to rise, I would follow until trending down. Continue Heparin drip until Troponin downtrending - very careful follow up with anemia Hb 8.8, agree with decreasing ASA dose. We will follow echocardiogram. At this point I would recommend an aggressive medical management, no further ischemic workup unless the patient becomes symptomatic. Continue ASA, no BB or ACEI as he is hypotensive with sepsis, no atorvastatin as he has significant LFTs elevation.   Dorothy Spark, MD 12/11/2014

## 2014-12-11 NOTE — Progress Notes (Signed)
[facility] Progress Note   Tirrell Buchberger ZOX:[id number] DOB: [birth year] DOA: 12/09/2014 PCP: Serita Grammes, CNM  Brief narrative: Anthony Nicholson is a 81 y.o. male with the recent diagnosis of prostate cancer stage IV on an androgen deprivation therapy and urinary obstruction with a Foley catheter who presents to the hospital for fever chills and confusion which started when driving home from his appointment and [place]. He was found to have a temperature of 104 in the ER and also complained of pain around the insertion of the Foley catheter. He maintained hypotensive despite IV fluids and therefore was started on pressors.  He had a recent Escherichia coli UTI   Subjective: No pain, dyspnea, cough, diarrhea- eating and drinking OK  Assessment/Plan: Principal Problem: Septic shock/ lactic acidosis/leukocytosis/fever and UTI - Gram-negative rods in 2 sets of blood cultures- source likely UTI but urine culture still negative - noted to have interstitial opacities on CXR but not hypoxic and without a cough - d/c Vanc and Zithromax on 8/24 - cont Zosyn - has been weaned off phenyephrine infusion - has been receiving NS at 150 cc/hr- 6 L positive balance- will stop IVF now-  - leukocytosis improving  Active Problems:    Acute renal failure -Baseline creatinine is about 1.1-he presented with a creatinine of 1.64 which rose to 1.95 and is improving- 1.4 now  Elevated TNI - TNI max 2.87 - cardiology called by admitting MD- they feel this is demand ischemia but have also started Heparin IV - ASA 81 mg daily- takes at home also - no further work up  Anemia, chronic, normocytic - baseline is 8- 10 range -currenlty 8.7- likely slightly diluted at this time a 6 L positive balance - cont to follow  Chronic hydronephrosis -Bilateral- chronic-  likely secondary to outlet obstruction  Prostate cancer stage IV -Urology following as outpatient-Dr. Jeffie Pollock  Code Status: DO NOT  RESUSCITATE Family Communication:  Disposition Plan: tx to med/surg, PT eval- lives alone DVT prophylaxis: SCDs, heparin infusion Consultants: Pulmonary critical care, urology, cardiology Procedures:  ECHO 8/16 Impressions:  - LVEF 60-65%, mild LVH, normal wall motion, diastolic dysfunction (gr 1) elevated LV filling pressure, mild TR, elevated RVSP at 37 mmHg.  Antibiotics: Anti-infectives    Start     Dose/Rate Route Frequency Ordered Stop   12/10/14 2200  vancomycin (VANCOCIN) IVPB 750 mg/150 ml premix  Status:  Discontinued     750 mg 150 mL/hr over 60 Minutes Intravenous Every 24 hours 12/10/14 0159 12/10/14 1445   12/10/14 1800  azithromycin (ZITHROMAX) 500 mg in dextrose 5 % 250 mL IVPB  Status:  Discontinued     500 mg 250 mL/hr over 60 Minutes Intravenous Every 24 hours 12/10/14 0418 12/10/14 1451   12/10/14 0000  piperacillin-tazobactam (ZOSYN) IVPB 3.375 g     3.375 g 12.5 mL/hr over 240 Minutes Intravenous 3 times per day 12/09/14 2230     12/09/14 2230  vancomycin (VANCOCIN) IVPB 1000 mg/200 mL premix     1,000 mg 200 mL/hr over 60 Minutes Intravenous  Once 12/09/14 2223 12/09/14 2338   12/09/14 1915  azithromycin (ZITHROMAX) 500 mg in dextrose 5 % 250 mL IVPB     500 mg 250 mL/hr over 60 Minutes Intravenous  Once 12/09/14 1914 12/09/14 2033   12/09/14 1845  piperacillin-tazobactam (ZOSYN) IVPB 3.375 g     3.375 g 12.5 mL/hr over 240 Minutes Intravenous  Once 12/09/14 1835 12/09/14 1933      Objective: [employer]  12/09/14 2055 12/10/14 0500 12/11/14 0500  Weight: 62.9 kg (138 lb 10.7 oz) 64.8 kg (142 lb 13.7 oz) 69 kg (152 lb 1.9 oz)    Intake/Output Summary (Last 24 hours) at 12/11/14 1025 Last data filed at 12/11/14 0758  Gross per 24 hour  Intake 4509.2 ml  Output   2200 ml  Net 2309.2 ml     Vitals Filed Vitals:   12/11/14 0800 12/11/14 0815 12/11/14 0840 12/11/14 0900  BP: 65/47 106/40 111/41 113/56  Pulse: 79 75 76 80  Temp: 97.6 F  (36.4 C)     TempSrc: Oral     Resp: 20 15 19 30   Height:      Weight:      SpO2: 97% 99% 99% 99%    Exam:  General:  Pt is alert, not in acute distress  HEENT: No icterus, No thrush, oral mucosa moist  Cardiovascular: regular rate and rhythm, S1/S2 No murmur  Respiratory: clear to auscultation bilaterally   Abdomen: Soft, +Bowel sounds, non tender, non distended, no guarding  MSK: No LE edema, cyanosis or clubbing  Data Reviewed: Basic Metabolic Panel:  Recent Labs Lab 12/09/14 1813 12/10/14 0140 12/10/14 0719 12/11/14 0330  NA 137 135 137 136  K 5.1 4.5 4.2 3.7  CL 103 106 113* 111  CO2 20* 20* 20* 20*  GLUCOSE 122* 115* 105* 95  BUN 43* 47* 44* 35*  CREATININE 1.64* 1.95* 1.72* 1.41*  CALCIUM 8.8* 7.6* 7.2* 7.1*   Liver Function Tests:  Recent Labs Lab 12/09/14 1813 12/10/14 0140 12/10/14 0719  AST 36 280* 199*  ALT 17 148* 129*  ALKPHOS 77 65 61  BILITOT 0.6 0.7 0.7  PROT 6.8 4.9* 5.1*  ALBUMIN 3.7 2.6* 2.6*   No results for input(s): LIPASE, AMYLASE in the last 168 hours. No results for input(s): AMMONIA in the last 168 hours. CBC:  Recent Labs Lab 12/09/14 1813 12/10/14 0140 12/10/14 0719 12/11/14 0330  WBC 6.2 12.7* 18.8* 14.6*  NEUTROABS 5.8 11.7* 17.6*  --   HGB 10.7* 8.8* 8.8* 8.7*  HCT 33.3* 27.4* 27.9* 26.7*  MCV 91.2 91.3 92.7 91.1  PLT 272 181 183 173   Cardiac Enzymes:  Recent Labs Lab 12/10/14 0510 12/10/14 0719 12/10/14 1209 12/10/14 1800  TROPONINI 1.55* 1.62* 2.25* 2.87*   BNP (last 3 results) No results for input(s): BNP in the last 8760 hours.  ProBNP (last 3 results) No results for input(s): PROBNP in the last 8760 hours.  CBG: No results for input(s): GLUCAP in the last 168 hours.  Recent Results (from the past 240 hour(s))  Culture, blood (routine x 2)     Status: None (Preliminary result)   Collection Time: 12/09/14  6:00 PM  Result Value Ref Range Status   Specimen Description BLOOD RIGHT  ANTECUBITAL  Final   Special Requests BOTTLES DRAWN AEROBIC AND ANAEROBIC 5CC  Final   Culture  Setup Time   Final    GRAM NEGATIVE RODS IN BOTH AEROBIC AND ANAEROBIC BOTTLES CRITICAL RESULT CALLED TO, READ BACK BY AND VERIFIED WITH: AKOONTZ 0713@08 /24/16 MKELLY    Culture   Final    GRAM NEGATIVE RODS Performed at [facility]    Report Status PENDING  Incomplete  Culture, blood (routine x 2)     Status: None (Preliminary result)   Collection Time: 12/09/14  6:07 PM  Result Value Ref Range Status   Specimen Description BLOOD RIGHT FOREARM  Final   Special Requests BOTTLES DRAWN  AEROBIC AND ANAEROBIC 5CC EACH  Final   Culture  Setup Time   Final    GRAM NEGATIVE RODS IN BOTH AEROBIC AND ANAEROBIC BOTTLES CRITICAL RESULT CALLED TO, READ BACK BY AND VERIFIED WITH: AKOONTZ 0713 @08 /24/16 MKELLY    Culture   Final    GRAM NEGATIVE RODS Performed at [facility]    Report Status PENDING  Incomplete  MRSA PCR Screening     Status: None   Collection Time: 12/09/14  9:14 PM  Result Value Ref Range Status   MRSA by PCR NEGATIVE NEGATIVE Final    Comment:        The GeneXpert MRSA Assay (FDA approved for NASAL specimens only), is one component of a comprehensive MRSA colonization surveillance program. It is not intended to diagnose MRSA infection nor to guide or monitor treatment for MRSA infections.   Urine culture     Status: None (Preliminary result)   Collection Time: 12/10/14  4:53 AM  Result Value Ref Range Status   Specimen Description URINE, RANDOM  Final   Special Requests NONE  Final   Culture   Final    CULTURE REINCUBATED FOR BETTER GROWTH Performed at [facility]    Report Status PENDING  Incomplete     Studies: US Renal  12/10/2014   CLINICAL DATA:  Acute renal failure. Sepsis. Elevated BUN and creatinine.  EXAM: RENAL / URINARY TRACT ULTRASOUND COMPLETE  COMPARISON:  CT abdomen and pelvis 10/03/2014. Ultrasound kidneys 09/29/2014.   FINDINGS: Right Kidney:  Length: 10.3 cm. Diffuse parenchymal thinning consistent with medical renal disease. Moderate hydronephrosis, similar prior study.  Left Kidney:  Length: 9.5 cm. Diffuse parenchymal thinning consistent with medical renal disease. Mild hydronephrosis, similar to prior study.  Bladder:  Bladder is decompressed with a Foley catheter and is not evaluated.  IMPRESSION: Diffuse parenchymal thinning bilaterally consistent with medical renal disease. Bilateral hydronephrosis similar prior study.   Electronically Signed   By: Lucienne Capers M.D.   On: 12/10/2014 00:09   Dg Chest Port 1 View  12/11/2014   CLINICAL DATA:  Pulmonary infiltrates.  EXAM: PORTABLE CHEST - 1 VIEW  COMPARISON:  12/09/2014.  FINDINGS: Patient is rotated to the right. Heart size normal. Diffuse bilateral pulmonary infiltrates consistent with bilateral pulmonary edema and/or pneumonia. Small bilateral effusions. No pneumothorax. No acute bony abnormality. Degenerative changes thoracic spine.  IMPRESSION: New onset of diffuse bilateral pulmonary alveolar infiltrates consistent with pulmonary edema and/or bilateral pneumonia. Small bilateral pleural effusions are also present.   Electronically Signed   By: Marcello Moores  Register   On: 12/11/2014 07:14   Dg Chest Port 1 View  12/09/2014   CLINICAL DATA:  Patient with history of fever. History of basal cell carcinoma and prostate cancer.  EXAM: PORTABLE CHEST - 1 VIEW  COMPARISON:  None.  FINDINGS: Monitoring leads overlie the patient. Normal cardiac and mediastinal contours. No large area of pulmonary consolidation. Coarse interstitial opacities. Biapical pleural parenchymal thickening. Low lung volumes. No pleural effusion or pneumothorax.  IMPRESSION: Interstitial opacities may represent component of atypical infection, chronic pulmonary changes or edema.  Biapical pleural parenchymal thickening.  Low lung volumes.   Electronically Signed   By: Lovey Newcomer M.D.   On:  12/09/2014 18:45    Scheduled Meds:  Scheduled Meds: . [START ON 12/12/2014] aspirin EC  81 mg Oral Daily  . pantoprazole  40 mg Oral Q1200  . piperacillin-tazobactam (ZOSYN)  IV  3.375 g Intravenous 3 times per day  Continuous Infusions: . heparin 950 Units/hr (12/11/14 0700)    Time spent on care of this patient: 35 min   [place], MD 12/11/2014, 10:25 AM  LOS: 2 days   [facility] Office  [phone] Pager - Text Page per [email or web address] If 7PM-7AM, please contact night-coverage [email or web address]

## 2014-12-11 NOTE — Progress Notes (Signed)
Pharmacy: Re- heparin  Patient's a 81 y.o Mon heparin for CP/ACS. Continuing heparin for now per cardiology until Troponin is wnl. Heparin level is now therapeutic at 0.35 with rate increased to 1100 unit/hr earlier today.  No bleeding documented.  Plan: - continue heparin drip at 1100 units/hr - f/u with AM labs   Dia Sitter, PharmD, BCPS 12/11/2014 9:52 PM

## 2014-12-11 NOTE — Progress Notes (Signed)
ANTICOAGULATION CONSULT NOTE - F/u Consult  Pharmacy Consult for heparin Indication: chest pain/ACS  No Known Allergies  Patient Measurements: Height: 5\' 8"  (172.7 cm) Weight: 142 lb 13.7 oz (64.8 kg) IBW/kg (Calculated) : 68.4 Heparin Dosing Weight: 65kg  Vital Signs: Temp: 98.1 F (36.7 C) (08/24 2327) Temp Source: Oral (08/24 2327) BP: 116/44 mmHg (08/25 0007) Pulse Rate: 84 (08/25 0007)  Labs:  Recent Labs  12/09/14 1813 12/09/14 2240 12/10/14 0140  12/10/14 0719 12/10/14 1209 12/10/14 1800 12/10/14 2335  HGB 10.7*  --  8.8*  --  8.8*  --   --   --   HCT 33.3*  --  27.4*  --  27.9*  --   --   --   PLT 272  --  181  --  183  --   --   --   APTT  --  29  --   --   --   --   --   --   LABPROT  --  14.5  --   --  14.8  --   --   --   INR  --  1.11  --   --  1.15  --   --   --   HEPARINUNFRC  --   --   --   --   --   --   --  0.19*  CREATININE 1.64*  --  1.95*  --  1.72*  --   --   --   TROPONINI  --   --   --   < > 1.62* 2.25* 2.87*  --   < > = values in this interval not displayed.  Estimated Creatinine Clearance: 24.1 mL/min (by C-G formula based on Cr of 1.72).   Medical History: Past Medical History  Diagnosis Date  . Basal cell carcinoma   . Laceration of arm 02/04/2013  . HOH (hard of hearing)    Assessment: 23 YOM presents with sepsis due to UTI.  Troponin are trending upward, patient does not complain of chest pain. EKG without acute changes per cardiology note.  Cardiology has asked pharmacy to dose heparin gtt for rule out ACS.   12/10/14  Baseline aPTT and INR WNL  CBC low but stable  Renal: AKI Today, 12/11/14  2335 HL=0.19, no problems per RN  Goal of Therapy:  Heparin level 0.3-0.7 units/ml Monitor platelets by anticoagulation protocol: Yes   Plan:   Rebolus with 1000 units and increase drip to 950 units/hr  Check 8h heparin level  Daily heparin level and CBC  Monitor for bleeding  Lawana Pai R 12/11/2014,12:32 AM

## 2014-12-12 DIAGNOSIS — R778 Other specified abnormalities of plasma proteins: Secondary | ICD-10-CM

## 2014-12-12 DIAGNOSIS — R7989 Other specified abnormal findings of blood chemistry: Secondary | ICD-10-CM

## 2014-12-12 DIAGNOSIS — R7881 Bacteremia: Secondary | ICD-10-CM

## 2014-12-12 LAB — BASIC METABOLIC PANEL
ANION GAP: 6 (ref 5–15)
BUN: 27 mg/dL — ABNORMAL HIGH (ref 6–20)
CHLORIDE: 114 mmol/L — AB (ref 101–111)
CO2: 19 mmol/L — AB (ref 22–32)
CREATININE: 1.24 mg/dL (ref 0.61–1.24)
Calcium: 7.4 mg/dL — ABNORMAL LOW (ref 8.9–10.3)
GFR calc non Af Amer: 48 mL/min — ABNORMAL LOW (ref >60)
GFR, EST AFRICAN AMERICAN: 56 mL/min — AB (ref >60)
GLUCOSE: 78 mg/dL (ref 65–99)
Potassium: 3.2 mmol/L — ABNORMAL LOW (ref 3.5–5.1)
Sodium: 139 mmol/L (ref 135–145)

## 2014-12-12 LAB — URINE CULTURE
Culture: 50000
Culture: >=100000

## 2014-12-12 LAB — CBC
HCT: 26 % — ABNORMAL LOW (ref 39.0–52.0)
Hemoglobin: 8.5 g/dL — ABNORMAL LOW (ref 13.0–17.0)
MCH: 29.8 pg (ref 26.0–34.0)
MCHC: 32.7 g/dL (ref 30.0–36.0)
MCV: 91.2 fL (ref 78.0–100.0)
PLATELETS: 168 10*3/uL (ref 150–400)
RBC: 2.85 MIL/uL — AB (ref 4.22–5.81)
RDW: 14.2 % (ref 11.5–15.5)
WBC: 8.1 10*3/uL (ref 4.0–10.5)

## 2014-12-12 LAB — TROPONIN I: Troponin I: 1.91 ng/mL (ref <0.031)

## 2014-12-12 LAB — HEPARIN LEVEL (UNFRACTIONATED): Heparin Unfractionated: 0.54 IU/mL (ref 0.30–0.70)

## 2014-12-12 MED ORDER — SENNOSIDES-DOCUSATE SODIUM 8.6-50 MG PO TABS
2.0000 | ORAL_TABLET | Freq: Once | ORAL | Status: AC
  Administered 2014-12-12: 2 via ORAL
  Filled 2014-12-12: qty 2

## 2014-12-12 MED ORDER — POTASSIUM CHLORIDE CRYS ER 20 MEQ PO TBCR
40.0000 meq | EXTENDED_RELEASE_TABLET | Freq: Once | ORAL | Status: AC
  Administered 2014-12-12: 40 meq via ORAL
  Filled 2014-12-12: qty 2

## 2014-12-12 MED ORDER — HEPARIN SODIUM (PORCINE) 5000 UNIT/ML IJ SOLN
5000.0000 [IU] | Freq: Three times a day (TID) | INTRAMUSCULAR | Status: DC
  Administered 2014-12-12 – 2014-12-13 (×3): 5000 [IU] via SUBCUTANEOUS
  Filled 2014-12-12 (×3): qty 1

## 2014-12-12 NOTE — Evaluation (Signed)
Physical Therapy Evaluation Patient Details Name: Anthony Nicholson MRN: [id number] DOB: 1917-08-25 Today's Date: 12/12/2014   History of Present Illness  81 year old male with prostate CA, CKD (including h/o ARF 2/2 urinary retention in 09/2014), anemia admitted with sepsis likely due to UTI, found to have elevated troponin (likely secondary to demand ischemia in setting of sepsis/anemia).  Clinical Impression  Pt admitted with above diagnosis. Pt currently with functional limitations due to the deficits listed below (see PT Problem List).  Pt will benefit from skilled PT to increase their independence and safety with mobility to allow discharge to the venue listed below.   Pt reports he is from facility (ILF, ALF?) however chart review stating from home alone.  No family present during session.  Pt able to ambulate with rollator today.  Recommend initial supervision for mobility upon d/c.     Follow Up Recommendations Supervision for mobility/OOB;Home health PT    Equipment Recommendations  None recommended by PT    Recommendations for Other Services       Precautions / Restrictions Precautions Precautions: Fall      Mobility  Bed Mobility Overal bed mobility: Needs Assistance Bed Mobility: Supine to Sit     Supine to sit: HOB elevated;Supervision        Transfers Overall transfer level: Needs assistance Equipment used: Rolling walker (2 wheeled) Transfers: Sit to/from Stand Sit to Stand: Min guard         General transfer comment: pt verbalizing brake check prior to standing, min/guard for safety  Ambulation/Gait Ambulation/Gait assistance: Min guard Ambulation Distance (Feet): 80 Feet Assistive device: 4-wheeled walker Gait Pattern/deviations: Step-through pattern     General Gait Details: pt steady with rollator, distance per pt tolerance, fatigues quickly  Stairs            Wheelchair Mobility    Modified Rankin (Stroke Patients Only)        Balance                                             Pertinent Vitals/Pain Pain Assessment: No/denies pain    Home Living Family/patient expects to be discharged to:: [place]: Walker - 4 wheels (pt's rollator in room) Additional Comments: per chart pt from home alone, CSW reports recent stay at SNF, pt reporting he lives at a facility and receives assist for bathing and dressing, meals    Prior Function Level of Independence: Needs assistance   Gait / Transfers Assistance Needed: modified independent with 4 wheeled walker  ADL's / Homemaking Assistance Needed: pt reports assist for bathing and dressing        Hand Dominance        Extremity/Trunk Assessment               Lower Extremity Assessment: Generalized weakness      Cervical / Trunk Assessment: Kyphotic  Communication   Communication: HOH  Cognition Arousal/Alertness: Awake/alert Behavior During Therapy: WFL for tasks assessed/performed Overall Cognitive Status: No family/caregiver present to determine baseline cognitive functioning                      General Comments      Exercises        Assessment/Plan    PT Assessment Patient  needs continued PT services  PT Diagnosis Difficulty walking   PT Problem List Decreased strength;Decreased activity tolerance;Decreased mobility  PT Treatment Interventions DME instruction;Gait training;Functional mobility training;Patient/family education;Therapeutic activities;Therapeutic exercise   PT Goals (Current goals can be found in the Care Plan section) Acute Rehab PT Goals PT Goal Formulation: With patient Time For Goal Achievement: 12/19/14 Potential to Achieve Goals: Good    Frequency Min 3X/week   Barriers to discharge        Co-evaluation               End of Session Equipment Utilized During Treatment: Gait belt Activity Tolerance: Patient tolerated treatment  well Patient left: in chair;with call bell/phone within reach;with chair alarm set Nurse Communication: Mobility status         Time: 4628-6381 PT Time Calculation (min) (ACUTE ONLY): 11 min   Charges:   PT Evaluation $Initial PT Evaluation Tier I: 1 Procedure     PT G Codes:        Noelani Harbach,KATHrine E 12/12/2014, 12:50 PM Carmelia Bake, PT, DPT 12/12/2014 Pager: [phone]

## 2014-12-12 NOTE — Progress Notes (Signed)
ANTICOAGULATION CONSULT NOTE   Pharmacy Consult for heparin Indication: chest pain/ACS  No Known Allergies  Patient Measurements: Height: 5\' 8"  (172.7 cm) Weight: 152 lb (68.947 kg) IBW/kg (Calculated) : 68.4  Vital Signs: Temp: 97.4 F (36.3 C) (08/26 0417) Temp Source: Oral (08/26 0417) BP: 147/61 mmHg (08/26 0417) Pulse Rate: 83 (08/26 0417)  Labs:  Recent Labs  12/09/14 2240  12/10/14 0719 12/10/14 1209 12/10/14 1800  12/11/14 0330 12/11/14 0950 12/11/14 2110 12/12/14 0446  HGB  --   < > 8.8*  --   --   --  8.7*  --   --  8.5*  HCT  --   < > 27.9*  --   --   --  26.7*  --   --  26.0*  PLT  --   < > 183  --   --   --  173  --   --  168  APTT 29  --   --   --   --   --   --   --   --   --   LABPROT 14.5  --  14.8  --   --   --   --   --   --   --   INR 1.11  --  1.15  --   --   --   --   --   --   --   HEPARINUNFRC  --   --   --   --   --   < >  --  0.24* 0.35 0.54  CREATININE  --   < > 1.72*  --   --   --  1.41*  --   --  1.24  TROPONINI  --   < > 1.62* 2.25* 2.87*  --   --   --   --  1.91*  < > = values in this interval not displayed.  Estimated Creatinine Clearance: 35.2 mL/min (by C-G formula based on Cr of 1.24).   Medical History: Past Medical History  Diagnosis Date  . Basal cell carcinoma   . Laceration of arm 02/04/2013  . HOH (hard of hearing)    Assessment: 36 YOM presents with sepsis due to UTI.  Troponin are trending down today, patient does not complain of chest pain. EKG without acute changes per cardiology note.  Cardiology has asked pharmacy to dose heparin gtt for rule out ACS.  Today, 12/12/2014   Heparin level herapeutic  Baseline aPTT and INR WNL  CBC: Hgb low but stable and pltc WNL  Renal: AKI - improving  Goal of Therapy:  Heparin level 0.3-0.7 units/ml Monitor platelets by anticoagulation protocol: Yes   Plan:   Continue Heparin infusion at 1100 units/hr  F/U duration of therapy.  Anticipate d/c as completes 48hr today  and troponin now trending down  Daily heparin level and CBC while on heparin  Monitor for bleeding  Netta Cedars, PharmD, BCPS Pager: [phone] 12/12/2014,8:30 AM

## 2014-12-12 NOTE — Progress Notes (Signed)
[facility] Progress Note   Anthony Nicholson GEZ:[id number] DOB: [birth year] DOA: 12/09/2014 PCP: Serita Grammes, CNM  Brief narrative: Anthony Nicholson is a 81 y.o. male with the recent diagnosis of prostate cancer stage IV on an androgen deprivation therapy and urinary obstruction with a Foley catheter who presents to the hospital for fever chills and confusion which started when driving home from his appointment and [place]. He was found to have a temperature of 104 in the ER and also complained of pain around the insertion of the Foley catheter. He maintained hypotensive despite IV fluids and therefore was started on pressors.  He had a recent Escherichia coli UTI   Subjective: Feels stiff. No dyspnea, cough, chest pain, nausea,vomting. No BM since admission   Assessment/Plan: Principal Problem: Septic shock/ lactic acidosis/leukocytosis/fever and UTI and gr neg bacteremia - Gram-negative rods in 2 sets of blood cultures- still awaiting speciation - source likely UTI but urine culture still negative - noted to have interstitial opacities on CXR but not hypoxic and without a cough - d/c Vanc and Zithromax on 8/24 - has been weaned off phenyephrine infusion - cont Zosyn- as leukocytosis and fever has resolved can, can  likely switch to oral augmentin and f/u on sensitivities later -will need 2 wks of abx    Active Problems:    Acute renal failure -Baseline creatinine is about 1.1-he presented with a creatinine of 1.64 which rose to 1.95 and is improving- 1.24 now  Elevated TNI - TNI max 2.87 - cardiology called by admitting MD- they feel this is demand ischemia but have also started Heparin IV - ASA 81 mg daily- takes at home also - no further work up  Anemia, chronic, normocytic - baseline is 8- 10 range -currenlty 8.7- likely slightly diluted at this time a 6 L positive balance - cont to follow  Chronic hydronephrosis/ chronic foley -Bilateral- chronic-  likely  secondary to outlet obstruction -per Dr Jeffie Pollock, he is scheduled for urodynamic studies on 8/31 to decide on whether he will need a TURP vs continued foley cath  Prostate cancer stage IV -Urology following as outpatient-Dr. Jeffie Pollock  Constipation - senna docusate ordered this AM  Hypokalemia - replace orally today  Code Status: DO NOT RESUSCITATE Family Communication:  Disposition Plan:   lives alone- still awaiting PT eval- d/c when PT eval complete and when off of Heparin DVT prophylaxis: SCDs, heparin infusion Consultants: Pulmonary critical care, urology, cardiology Procedures:  ECHO 8/16 Impressions:  - LVEF 60-65%, mild LVH, normal wall motion, diastolic dysfunction (gr 1) elevated LV filling pressure, mild TR, elevated RVSP at 37 mmHg.  Antibiotics: Anti-infectives    Start     Dose/Rate Route Frequency Ordered Stop   12/10/14 2200  vancomycin (VANCOCIN) IVPB 750 mg/150 ml premix  Status:  Discontinued     750 mg 150 mL/hr over 60 Minutes Intravenous Every 24 hours 12/10/14 0159 12/10/14 1445   12/10/14 1800  azithromycin (ZITHROMAX) 500 mg in dextrose 5 % 250 mL IVPB  Status:  Discontinued     500 mg 250 mL/hr over 60 Minutes Intravenous Every 24 hours 12/10/14 0418 12/10/14 1451   12/10/14 0000  piperacillin-tazobactam (ZOSYN) IVPB 3.375 g     3.375 g 12.5 mL/hr over 240 Minutes Intravenous 3 times per day 12/09/14 2230     12/09/14 2230  vancomycin (VANCOCIN) IVPB 1000 mg/200 mL premix     1,000 mg 200 mL/hr over 60 Minutes Intravenous  Once 12/09/14 2223 12/09/14 2338  12/09/14 1915  azithromycin (ZITHROMAX) 500 mg in dextrose 5 % 250 mL IVPB     500 mg 250 mL/hr over 60 Minutes Intravenous  Once 12/09/14 1914 12/09/14 2033   12/09/14 1845  piperacillin-tazobactam (ZOSYN) IVPB 3.375 g     3.375 g 12.5 mL/hr over 240 Minutes Intravenous  Once 12/09/14 1835 12/09/14 1933      Objective: Filed Weights   12/10/14 0500 12/11/14 0500 12/12/14 0417  Weight: 64.8  kg (142 lb 13.7 oz) 69 kg (152 lb 1.9 oz) 68.947 kg (152 lb)    Intake/Output Summary (Last 24 hours) at 12/12/14 0753 Last data filed at 12/12/14 0500  Gross per 24 hour  Intake 1071.14 ml  Output   2250 ml  Net -1178.86 ml     Vitals Filed Vitals:   12/11/14 1000 12/11/14 1200 12/11/14 2051 12/12/14 0417  BP: 121/54 107/34 126/62 147/61  Pulse: 83 66 87 83  Temp:   98.6 F (37 C) 97.4 F (36.3 C)  TempSrc:   Oral Oral  Resp: 22 18 17 20   Height:      Weight:    68.947 kg (152 lb)  SpO2: 97% 96% 100% 97%    Exam:  General:  Pt is alert, not in acute distress  HEENT: No icterus, No thrush, oral mucosa moist  Cardiovascular: regular rate and rhythm, S1/S2 No murmur  Respiratory: - upper airway congestion and wheeze today, no resp distress  Abdomen: Soft, +Bowel sounds, non tender, non distended, no guarding  MSK: No LE edema, cyanosis or clubbing  Data Reviewed: Basic Metabolic Panel:  Recent Labs Lab 12/09/14 1813 12/10/14 0140 12/10/14 0719 12/11/14 0330 12/12/14 0446  NA 137 135 137 136 139  K 5.1 4.5 4.2 3.7 3.2*  CL 103 106 113* 111 114*  CO2 20* 20* 20* 20* 19*  GLUCOSE 122* 115* 105* 95 78  BUN 43* 47* 44* 35* 27*  CREATININE 1.64* 1.95* 1.72* 1.41* 1.24  CALCIUM 8.8* 7.6* 7.2* 7.1* 7.4*   Liver Function Tests:  Recent Labs Lab 12/09/14 1813 12/10/14 0140 12/10/14 0719  AST 36 280* 199*  ALT 17 148* 129*  ALKPHOS 77 65 61  BILITOT 0.6 0.7 0.7  PROT 6.8 4.9* 5.1*  ALBUMIN 3.7 2.6* 2.6*   No results for input(s): LIPASE, AMYLASE in the last 168 hours. No results for input(s): AMMONIA in the last 168 hours. CBC:  Recent Labs Lab 12/09/14 1813 12/10/14 0140 12/10/14 0719 12/11/14 0330 12/12/14 0446  WBC 6.2 12.7* 18.8* 14.6* 8.1  NEUTROABS 5.8 11.7* 17.6*  --   --   HGB 10.7* 8.8* 8.8* 8.7* 8.5*  HCT 33.3* 27.4* 27.9* 26.7* 26.0*  MCV 91.2 91.3 92.7 91.1 91.2  PLT 272 181 183 173 168   Cardiac Enzymes:  Recent Labs Lab  12/10/14 0510 12/10/14 0719 12/10/14 1209 12/10/14 1800 12/12/14 0446  TROPONINI 1.55* 1.62* 2.25* 2.87* 1.91*   BNP (last 3 results) No results for input(s): BNP in the last 8760 hours.  ProBNP (last 3 results) No results for input(s): PROBNP in the last 8760 hours.  CBG: No results for input(s): GLUCAP in the last 168 hours.  Recent Results (from the past 240 hour(s))  Culture, blood (routine x 2)     Status: None (Preliminary result)   Collection Time: 12/09/14  6:00 PM  Result Value Ref Range Status   Specimen Description BLOOD RIGHT ANTECUBITAL  Final   Special Requests BOTTLES DRAWN AEROBIC AND ANAEROBIC 5CC  Final  Culture  Setup Time   Final    GRAM NEGATIVE RODS IN BOTH AEROBIC AND ANAEROBIC BOTTLES CRITICAL RESULT CALLED TO, READ BACK BY AND VERIFIED WITH: AKOONTZ 0713@08 /24/16 MKELLY    Culture   Final    GRAM NEGATIVE RODS Performed at [facility]    Report Status PENDING  Incomplete  Culture, blood (routine x 2)     Status: None (Preliminary result)   Collection Time: 12/09/14  6:07 PM  Result Value Ref Range Status   Specimen Description BLOOD RIGHT FOREARM  Final   Special Requests BOTTLES DRAWN AEROBIC AND ANAEROBIC 5CC EACH  Final   Culture  Setup Time   Final    GRAM NEGATIVE RODS IN BOTH AEROBIC AND ANAEROBIC BOTTLES CRITICAL RESULT CALLED TO, READ BACK BY AND VERIFIED WITH: AKOONTZ 0713 @08 /24/16 MKELLY    Culture   Final    GRAM NEGATIVE RODS Performed at [facility]    Report Status PENDING  Incomplete  MRSA PCR Screening     Status: None   Collection Time: 12/09/14  9:14 PM  Result Value Ref Range Status   MRSA by PCR NEGATIVE NEGATIVE Final    Comment:        The GeneXpert MRSA Assay (FDA approved for NASAL specimens only), is one component of a comprehensive MRSA colonization surveillance program. It is not intended to diagnose MRSA infection nor to guide or monitor treatment for MRSA infections.   Urine culture      Status: None (Preliminary result)   Collection Time: 12/10/14 12:14 AM  Result Value Ref Range Status   Specimen Description URINE, RANDOM  Final   Special Requests NONE  Final   Culture   Final    CULTURE REINCUBATED FOR BETTER GROWTH Performed at [facility]    Report Status PENDING  Incomplete  Urine culture     Status: None (Preliminary result)   Collection Time: 12/10/14  4:53 AM  Result Value Ref Range Status   Specimen Description URINE, RANDOM  Final   Special Requests NONE  Final   Culture   Final    CULTURE REINCUBATED FOR BETTER GROWTH Performed at [facility]    Report Status PENDING  Incomplete     Studies: Dg Chest Port 1 View  12/11/2014   CLINICAL DATA:  Pulmonary infiltrates.  EXAM: PORTABLE CHEST - 1 VIEW  COMPARISON:  12/09/2014.  FINDINGS: Patient is rotated to the right. Heart size normal. Diffuse bilateral pulmonary infiltrates consistent with bilateral pulmonary edema and/or pneumonia. Small bilateral effusions. No pneumothorax. No acute bony abnormality. Degenerative changes thoracic spine.  IMPRESSION: New onset of diffuse bilateral pulmonary alveolar infiltrates consistent with pulmonary edema and/or bilateral pneumonia. Small bilateral pleural effusions are also present.   Electronically Signed   By: [place]   On: 12/11/2014 07:14    Scheduled Meds:  Scheduled Meds: . aspirin EC  81 mg Oral Daily  . pantoprazole  40 mg Oral Q1200  . piperacillin-tazobactam (ZOSYN)  IV  3.375 g Intravenous 3 times per day  . senna-docusate  2 tablet Oral Once   Continuous Infusions: . heparin 1,100 Units/hr (12/11/14 1455)    Time spent on care of this patient: 35 min   [place], MD 12/12/2014, 7:53 AM  LOS: 3 days   [facility] Office  [phone] Pager - Text Page per [email or web address] If 7PM-7AM, please contact night-coverage [email or web address]

## 2014-12-12 NOTE — Care Management Note (Signed)
Case Management Note  Patient Details  Name: Anthony Nicholson MRN: [id number] Date of Birth: [birth year]  Subjective/Objective:        81 yo admitted with Sepsis            Action/Plan: Pt from home alone  Expected Discharge Date:   (unknown)               Expected Discharge Plan:  [place]  In-House Referral:  NA  Discharge planning Services  CM Consult  Post Acute Care Choice:  Home Health Choice offered to:  Adult Children  DME Arranged:  N/A DME Agency:  NA  HH Arranged:  PT, OT, RN, Nurse's Aide [place] Agency:  [place]  Status of Service:  In process, will continue to follow  Medicare Important Message Given:  Yes-second notification given Date Medicare IM Given:    Medicare IM give by:    Date Additional Medicare IM Given:    Additional Medicare Important Message give by:     If discussed at [place] of Stay Meetings, dates discussed:    Additional Comments: PT is recommending HHPT at [place]. Per nursing staff, pt's daughter states that pt lives alone in a retirement community and has used [place] in the past. She states she would like to use them again if her father needs any [place] services. This CM attempted to contact daughter and left a voicemail. [name] rep contacted to give referral. Will need MD orders for [facility] services at [place]. CM will continue to follow. [name], RN 12/12/2014, 1:59 PM

## 2014-12-12 NOTE — Care Management Important Message (Signed)
Important Message  Patient Details  Name: Anthony Nicholson MRN: [id number] Date of Birth: Aug 30, 1917   Medicare Important Message Given:  Yes-second notification given    Camillo Flaming 12/12/2014, 1:29 [place] Message  Patient Details  Name: Anthony Nicholson MRN: [id number] Date of Birth: October 16, 1917   Medicare Important Message Given:  Yes-second notification given    Camillo Flaming 12/12/2014, 1:29 PM

## 2014-12-12 NOTE — Progress Notes (Signed)
Patient: Anthony Nicholson / Admit Date: 12/09/2014 / Date of Encounter: 12/12/2014, 8:47 AM   Subjective: Appetite is good, did great with breakfast. No CP or SOB.   Objective: Telemetry: NSR occasional PVCs, occasional PACs in succession Physical Exam: Blood pressure 147/61, pulse 83, temperature 97.4 F (36.3 C), temperature source Oral, resp. rate 20, height 5\' 8"  (1.727 m), weight 152 lb (68.947 kg), SpO2 97 %. General: Well developed elderly WM in no acute distress. Head: Normocephalic, atraumatic, sclera non-icteric, no xanthomas, nares are without discharge. Neck: JVP not elevated. Lungs: Occasional expiratory wheezing (more pronounced than yesterday), otherwise no rales or rhonchi. Breathing is unlabored. Heart: RRR S1 S2 without murmurs, rubs, or gallops.  Abdomen: Soft, non-tender, non-distended with normoactive bowel sounds. No rebound/guarding. Extremities: No clubbing or cyanosis. No edema. Distal pedal pulses are 2+ and equal bilaterally. Neuro: Alert and oriented X 3. Moves all extremities spontaneously. Hard of hearing Psych: Responds to questions appropriately with a normal affect.    Intake/Output Summary (Last 24 hours) at 12/12/14 0847 Last data filed at 12/12/14 0557  Gross per 24 hour  Intake 1057.64 ml  Output   2250 ml  Net -1192.36 ml    Inpatient Medications:  . aspirin EC  81 mg Oral Daily  . piperacillin-tazobactam (ZOSYN)  IV  3.375 g Intravenous 3 times per day  . potassium chloride  40 mEq Oral Once  . senna-docusate  2 tablet Oral Once   Infusions:  . heparin 1,100 Units/hr (12/11/14 1455)    Labs:  Recent Labs  12/11/14 0330 12/12/14 0446  NA 136 139  K 3.7 3.2*  CL 111 114*  CO2 20* 19*  GLUCOSE 95 78  BUN 35* 27*  CREATININE 1.41* 1.24  CALCIUM 7.1* 7.4*    Recent Labs  12/10/14 0140 12/10/14 0719  AST 280* 199*  ALT 148* 129*  ALKPHOS 65 61  BILITOT 0.7 0.7  PROT 4.9* 5.1*  ALBUMIN 2.6* 2.6*    Recent Labs  12/10/14 0140 12/10/14 0719 12/11/14 0330 12/12/14 0446  WBC 12.7* 18.8* 14.6* 8.1  NEUTROABS 11.7* 17.6*  --   --   HGB 8.8* 8.8* 8.7* 8.5*  HCT 27.4* 27.9* 26.7* 26.0*  MCV 91.3 92.7 91.1 91.2  PLT 181 183 173 168    Recent Labs  12/10/14 0719 12/10/14 1209 12/10/14 1800 12/12/14 0446  TROPONINI 1.62* 2.25* 2.87* 1.91*   Invalid input(s): POCBNP No results for input(s): HGBA1C in the last 72 hours.   Radiology/Studies:  US Renal  12/10/2014   CLINICAL DATA:  Acute renal failure. Sepsis. Elevated BUN and creatinine.  EXAM: RENAL / URINARY TRACT ULTRASOUND COMPLETE  COMPARISON:  CT abdomen and pelvis 10/03/2014. Ultrasound kidneys 09/29/2014.  FINDINGS: Right Kidney:  Length: 10.3 cm. Diffuse parenchymal thinning consistent with medical renal disease. Moderate hydronephrosis, similar prior study.  Left Kidney:  Length: 9.5 cm. Diffuse parenchymal thinning consistent with medical renal disease. Mild hydronephrosis, similar to prior study.  Bladder:  Bladder is decompressed with a Foley catheter and is not evaluated.  IMPRESSION: Diffuse parenchymal thinning bilaterally consistent with medical renal disease. Bilateral hydronephrosis similar prior study.   Electronically Signed   By: Lucienne Capers M.D.   On: 12/10/2014 00:09   Dg Chest Port 1 View  12/11/2014   CLINICAL DATA:  Pulmonary infiltrates.  EXAM: PORTABLE CHEST - 1 VIEW  COMPARISON:  12/09/2014.  FINDINGS: Patient is rotated to the right. Heart size normal. Diffuse bilateral pulmonary infiltrates consistent with bilateral pulmonary edema  and/or pneumonia. Small bilateral effusions. No pneumothorax. No acute bony abnormality. Degenerative changes thoracic spine.  IMPRESSION: New onset of diffuse bilateral pulmonary alveolar infiltrates consistent with pulmonary edema and/or bilateral pneumonia. Small bilateral pleural effusions are also present.   Electronically Signed   By: Marcello Moores  Register   On: 12/11/2014 07:14   Dg Chest  Port 1 View  12/09/2014   CLINICAL DATA:  Patient with history of fever. History of basal cell carcinoma and prostate cancer.  EXAM: PORTABLE CHEST - 1 VIEW  COMPARISON:  None.  FINDINGS: Monitoring leads overlie the patient. Normal cardiac and mediastinal contours. No large area of pulmonary consolidation. Coarse interstitial opacities. Biapical pleural parenchymal thickening. Low lung volumes. No pleural effusion or pneumothorax.  IMPRESSION: Interstitial opacities may represent component of atypical infection, chronic pulmonary changes or edema.  Biapical pleural parenchymal thickening.  Low lung volumes.   Electronically Signed   By: Lovey Newcomer M.D.   On: 12/09/2014 18:45   TTE: 12/10/14 Study Conclusions  - Left ventricle: The cavity size was normal. Wall thickness was increased in a pattern of mild LVH. Systolic function was normal. The estimated ejection fraction was in the range of 60% to 65%. Wall motion was normal; there were no regional wall motion abnormalities. Doppler parameters are consistent with abnormal left ventricular relaxation (grade 1 diastolic dysfunction). The E/e&' ratio is >15, suggesting elevated LV filling pressure. - Mitral valve: Calcified annulus. There was trivial regurgitation. - Tricuspid valve: There was mild regurgitation. - Pulmonary arteries: PA peak pressure: 37 mm Hg (S). - Inferior vena cava: The vessel was normal in size. The respirophasic diameter changes were in the normal range (>= 50%), consistent with normal central venous pressure.  Impressions:  - LVEF 60-65%, mild LVH, normal wall motion, diastolic dysfunction, elevated LV filling pressure, mild TR, elevated RVSP at 37 mmHg.    Assessment and Plan  81M with prostate CA, CKD (including h/o ARF 2/2 urinary retention in 09/2014), anemia admitted with sepsis likely due to UTI, found to have elevated troponin.  1. Elevated troponin likely secondary to demand ischemia in  setting of sepsis/anemia - EKG without acute changes -> troponin peak 2.87 thus far. - hold off statin due to transaminitis (LDL 60) - decreased ASA due to anemia - will clarify plan to d/c heparin with MD today (troponin finally downtrending)  - would not add BB at this time given wheezing - 2D echo 12/10/14: mild LVH, EF 60-65%, no RWMA, grade 1 DD, mild TR. Would likely not pursue ischemic workup due to limited risk factors, no recent or current anginal symptoms, age, comorbidities below and current DNR status  2. Wheezing - CXR 8/25 with PNA vs edema - weight is up but also received fluid resuscitation this admission - will discuss empiric dose of Lasix although he is starting to auto-diurese on his most recent I/Os (and 350cc UOP filled up already this AM) - IM is already repleting K+  3. Sepsis likely secondary to UTI - WBC now normal, on abx - per IM  4. 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 Acute on chronic anemia - per IM - reduced ASA to 81mg  daily  6. Prostate Cancer - followed by Urology  Signed, Melina Copa PA-C Pager: [phone]   The patient was seen, examined and discussed with Melina Copa, PA-C and I agree with the above.   81 year old male admitted with sepsis sec to UTI with elevated troponin - in the  settings of sepsis, acute on chronic kidney failure. The patient is asymptomatic with an ongoing infection and has DNR status. ECG shows no acute changes. Troponin is now trending down, we d/c-ed heparin drip.  He has anemia Hb 8.8, agree with decreasing ASA dose. Echocardiogram shows LVEF 60-65% with no regional wall motion abnormalities. At this point I would recommend to continue medical management, no further ischemic workup unless the patient becomes symptomatic. Continue ASA, no BB or ACEI as he is hypotensive with sepsis, no atorvastatin as he has significant LFTs elevation.   Dorothy Spark, MD 12/12/2014

## 2014-12-13 DIAGNOSIS — A4151 Sepsis due to Escherichia coli [E. coli]: Secondary | ICD-10-CM

## 2014-12-13 DIAGNOSIS — R338 Other retention of urine: Secondary | ICD-10-CM

## 2014-12-13 LAB — BASIC METABOLIC PANEL
Anion gap: 4 — ABNORMAL LOW (ref 5–15)
BUN: 22 mg/dL — AB (ref 6–20)
CHLORIDE: 113 mmol/L — AB (ref 101–111)
CO2: 22 mmol/L (ref 22–32)
Calcium: 7.9 mg/dL — ABNORMAL LOW (ref 8.9–10.3)
Creatinine, Ser: 1.11 mg/dL (ref 0.61–1.24)
GFR calc Af Amer: >60 mL/min (ref >60)
GFR calc non Af Amer: 55 mL/min — ABNORMAL LOW (ref >60)
GLUCOSE: 90 mg/dL (ref 65–99)
POTASSIUM: 3.7 mmol/L (ref 3.5–5.1)
SODIUM: 139 mmol/L (ref 135–145)

## 2014-12-13 MED ORDER — SENNOSIDES-DOCUSATE SODIUM 8.6-50 MG PO TABS: 1.0000 | ORAL_TABLET | Freq: Every evening | ORAL | Status: DC | PRN

## 2014-12-13 MED ORDER — SODIUM CHLORIDE 0.9 % IV SOLN
2.0000 g | Freq: Four times a day (QID) | INTRAVENOUS | Status: DC
  Filled 2014-12-13: qty 2000

## 2014-12-13 MED ORDER — AMOXICILLIN 250 MG PO CAPS
500.0000 mg | ORAL_CAPSULE | Freq: Three times a day (TID) | ORAL | Status: DC
  Administered 2014-12-13: 500 mg via ORAL
  Filled 2014-12-13: qty 2

## 2014-12-13 MED ORDER — AMOXICILLIN 500 MG PO CAPS: 500.0000 mg | ORAL_CAPSULE | Freq: Three times a day (TID) | ORAL | Status: DC

## 2014-12-13 MED ORDER — TAMSULOSIN HCL 0.4 MG PO CAPS
0.4000 mg | ORAL_CAPSULE | Freq: Every day | ORAL | Status: DC
  Administered 2014-12-13: 0.4 mg via ORAL
  Filled 2014-12-13: qty 1

## 2014-12-13 MED ORDER — POLYETHYLENE GLYCOL 3350 17 G PO PACK: 17.0000 g | PACK | Freq: Every day | ORAL | Status: DC | PRN

## 2014-12-13 NOTE — Progress Notes (Signed)
Subjective: Anthony Nicholson is doing well today. Suprapubic pain resolved. Urine culture grew E. Coli and proteus which are both sensitive to bactrim  Objective: Vital signs in last 24 hours: Temp:  [97.6 F (36.4 C)-98.4 F (36.9 C)] 98.3 F (36.8 C) (08/27 0503) Pulse Rate:  [56-86] 85 (08/27 0503) Resp:  [18] 18 (08/27 0503) BP: (132-153)/(52-62) 142/62 mmHg (08/27 0503) SpO2:  [95 %-98 %] 95 % (08/27 0503) Weight:  [70.262 kg (154 lb 14.4 oz)] 70.262 kg (154 lb 14.4 oz) (08/27 0503)  Intake/Output from previous day: 08/26 0701 - 08/27 0700 In: 949.6 [P.O.:580; I.V.:219.6; IV Piggyback:150] Out: 1400 [Urine:1400] Intake/Output this shift:    Physical Exam:  General:alert, cooperative and appears stated age GI: not done and soft, non tender, normal bowel sounds, no palpable masses, no organomegaly, no inguinal hernia Male genitalia: no penile lesions or discharge no testicular masses Resp: clear to auscultation bilaterally Extremities: extremities normal, atraumatic, no cyanosis or edema  Lab Results:  Recent Labs  12/11/14 0330 12/12/14 0446  HGB 8.7* 8.5*  HCT 26.7* 26.0*   BMET  Recent Labs  12/12/14 0446 12/13/14 0515  NA 139 139  K 3.2* 3.7  CL 114* 113*  CO2 19* 22  GLUCOSE 78 90  BUN 27* 22*  CREATININE 1.24 1.11  CALCIUM 7.4* 7.9*   No results for input(s): LABPT, INR in the last 72 hours. No results for input(s): LABURIN in the last 72 hours. Results for orders placed or performed during the hospital encounter of 12/09/14  Culture, blood (routine x 2)     Status: None (Preliminary result)   Collection Time: 12/09/14  6:00 PM  Result Value Ref Range Status   Specimen Description BLOOD RIGHT ANTECUBITAL  Final   Special Requests BOTTLES DRAWN AEROBIC AND ANAEROBIC 5CC  Final   Culture  Setup Time   Final    GRAM NEGATIVE RODS IN BOTH AEROBIC AND ANAEROBIC BOTTLES CRITICAL RESULT CALLED TO, READ BACK BY AND VERIFIED WITH: AKOONTZ 0713@08 /24/16  MKELLY    Culture   Final    ESCHERICHIA COLI PROTEUS MIRABILIS Performed at [facility]    Report Status PENDING  Incomplete   Organism ID, Bacteria ESCHERICHIA COLI  Final   Organism ID, Bacteria PROTEUS MIRABILIS  Final      Susceptibility   Escherichia coli - MIC*    AMPICILLIN <=2 SENSITIVE Sensitive     CEFAZOLIN <=4 SENSITIVE Sensitive     CEFEPIME <=1 SENSITIVE Sensitive     CEFTAZIDIME <=1 SENSITIVE Sensitive     CEFTRIAXONE <=1 SENSITIVE Sensitive     CIPROFLOXACIN <=0.25 SENSITIVE Sensitive     GENTAMICIN <=1 SENSITIVE Sensitive     IMIPENEM <=0.25 SENSITIVE Sensitive     TRIMETH/SULFA <=20 SENSITIVE Sensitive     AMPICILLIN/SULBACTAM <=2 SENSITIVE Sensitive     PIP/TAZO <=4 SENSITIVE Sensitive     * ESCHERICHIA COLI   Proteus mirabilis - MIC*    AMPICILLIN <=2 SENSITIVE Sensitive     CEFAZOLIN <=4 SENSITIVE Sensitive     CEFEPIME <=1 SENSITIVE Sensitive     CEFTAZIDIME <=1 SENSITIVE Sensitive     CEFTRIAXONE <=1 SENSITIVE Sensitive     CIPROFLOXACIN <=0.25 SENSITIVE Sensitive     GENTAMICIN <=1 SENSITIVE Sensitive     IMIPENEM 2 SENSITIVE Sensitive     TRIMETH/SULFA <=20 SENSITIVE Sensitive     AMPICILLIN/SULBACTAM <=2 SENSITIVE Sensitive     PIP/TAZO <=4 SENSITIVE Sensitive     * PROTEUS MIRABILIS  Culture, blood (  routine x 2)     Status: None (Preliminary result)   Collection Time: 12/09/14  6:07 PM  Result Value Ref Range Status   Specimen Description BLOOD RIGHT FOREARM  Final   Special Requests BOTTLES DRAWN AEROBIC AND ANAEROBIC 5CC EACH  Final   Culture  Setup Time   Final    GRAM NEGATIVE RODS IN BOTH AEROBIC AND ANAEROBIC BOTTLES CRITICAL RESULT CALLED TO, READ BACK BY AND VERIFIED WITH: AKOONTZ [id number] @08 /24/16 MKELLY    Culture   Final    ESCHERICHIA COLI SUSCEPTIBILITIES PERFORMED ON PREVIOUS CULTURE WITHIN THE LAST 5 DAYS. Performed at [facility]    Report Status PENDING  Incomplete  MRSA PCR Screening     Status: None    Collection Time: 12/09/14  9:14 PM  Result Value Ref Range Status   MRSA by PCR NEGATIVE NEGATIVE Final    Comment:        The GeneXpert MRSA Assay (FDA approved for NASAL specimens only), is one component of a comprehensive MRSA colonization surveillance program. It is not intended to diagnose MRSA infection nor to guide or monitor treatment for MRSA infections.   Urine culture     Status: None   Collection Time: 12/10/14 12:14 AM  Result Value Ref Range Status   Specimen Description URINE, RANDOM  Final   Special Requests NONE  Final   Culture   Final    >=100,000 COLONIES/mL PROTEUS MIRABILIS >=100,000 COLONIES/mL ESCHERICHIA COLI SUSCEPTIBILITIES PERFORMED ON PREVIOUS CULTURE WITHIN THE LAST 5 DAYS. Performed at [facility]    Report Status 12/12/2014 FINAL  Final  Urine culture     Status: None   Collection Time: 12/10/14  4:53 AM  Result Value Ref Range Status   Specimen Description URINE, RANDOM  Final   Special Requests NONE  Final   Culture   Final    50,000 COLONIES/mL PROTEUS MIRABILIS 60,000 COLONIES/ml ESCHERICHIA COLI Performed at [facility]    Report Status 12/12/2014 FINAL  Final   Organism ID, Bacteria PROTEUS MIRABILIS  Final   Organism ID, Bacteria ESCHERICHIA COLI  Final      Susceptibility   Escherichia coli - MIC*    AMPICILLIN <=2 SENSITIVE Sensitive     CEFAZOLIN <=4 SENSITIVE Sensitive     CEFTRIAXONE <=1 SENSITIVE Sensitive     CIPROFLOXACIN <=0.25 SENSITIVE Sensitive     GENTAMICIN <=1 SENSITIVE Sensitive     IMIPENEM <=0.25 SENSITIVE Sensitive     NITROFURANTOIN <=16 SENSITIVE Sensitive     TRIMETH/SULFA <=20 SENSITIVE Sensitive     AMPICILLIN/SULBACTAM <=2 SENSITIVE Sensitive     PIP/TAZO <=4 SENSITIVE Sensitive     * 60,000 COLONIES/ml ESCHERICHIA COLI   Proteus mirabilis - MIC*    AMPICILLIN <=2 SENSITIVE Sensitive     CEFAZOLIN <=4 SENSITIVE Sensitive     CEFTRIAXONE <=1 SENSITIVE Sensitive     CIPROFLOXACIN  <=0.25 SENSITIVE Sensitive     GENTAMICIN <=1 SENSITIVE Sensitive     IMIPENEM 1 SENSITIVE Sensitive     NITROFURANTOIN 128 RESISTANT Resistant     TRIMETH/SULFA <=20 SENSITIVE Sensitive     AMPICILLIN/SULBACTAM <=2 SENSITIVE Sensitive     PIP/TAZO <=4 SENSITIVE Sensitive     * 50,000 COLONIES/mL PROTEUS MIRABILIS    Studies/Results: No results found.  Assessment/Plan: 81yo with prostate cancer, retention and UTI  Recs: 1. flomax 0.4mg  daily 2. Bacrim DS BID for 10 days 3. Pt to followup with Dr. Jeffie Pollock on 8/31 for urodynamics. This  appointment is already schedule and patient and daughter are aware of the appointment. The pt already has scripts for flomax and bactrim at home which he is instructed to fill.   LOS: 4 days   Natalye Kott L 12/13/2014, 9:06 AM

## 2014-12-13 NOTE — Discharge Summary (Signed)
Physician Discharge Summary  Nicholas Trompeter HBZ:[id number] DOB: 1917-06-01 DOA: 12/09/2014  PCP: Serita Grammes, CNM  Admit date: 12/09/2014 Discharge date: 12/13/2014  Time spent: 55 minutes  Recommendations for Outpatient Follow-up:  1. Will be discharged with Amoxil to complete a 14 day course for E coli bacteremia 2. Will go home with HHPT and aid  Discharge Condition: stable Diet recommendation: low sodium heart healthy   Discharge Diagnoses:  Principal Problem:   Sepsis Active Problems:   Acute urinary retention   Acute renal failure   Adenocarcinoma of prostate   UTI (lower urinary tract infection)   Demand ischemia   Elevated troponin I level   History of present illness:  Anthony Nicholson is a 81 y.o. male with the recent diagnosis of prostate cancer stage IV on an androgen deprivation therapy and urinary obstruction with a Foley catheter who presents to the hospital for fever chills and confusion which started when driving home from his appointment and [place]. He was found to have a temperature of 104 in the ER and also complained of pain around the insertion of the Foley catheter. He maintained hypotensive despite IV fluids and therefore was started on pressors. He had a recent Escherichia coli UTI  Hospital Course:  Principal Problem: Septic shock/ lactic acidosis/leukocytosis/fever and UTI and gr neg bacteremia - Ecoli in 2 sets of blood cultures  - E coli and Proteus in U culture - noted to have interstitial opacities on CXR but not hypoxic and without a cough - has been weaned off phenyephrine infusion -- d/c Vanc and Zithromax on 8/24- continued Zosyn- now transitioning to Amoxil based upon sensitivities -will need 2 wks course of abx    Active Problems:   Acute renal failure -Baseline creatinine is about 1.1-he presented with a creatinine of 1.64 which rose to 1.95 and has since steadily improved to baseline  Elevated TNI - TNI max 2.87 - cardiology  called by admitting MD- they feel this is demand ischemia but did also start Heparin IV which was discontinued yesterday - ASA 81 mg daily- takes at home also - no further work up  Anemia, chronic, normocytic - baseline is 8- 10 range -currenlty 8.5- likely slightly diluted at this time a 6 L positive balance   Chronic hydronephrosis/ chronic foley -Bilateral- chronic- likely secondary to outlet obstruction -per Dr Jeffie Pollock, he is scheduled for urodynamic studies on 8/31 to decide on whether he will need a TURP vs continued foley cath  Prostate cancer stage IV -Urology following as outpatient- cont Firmagon per Dr. Jeffie Pollock  Constipation - senna docusate ordered  - had 2  BMs  Hypokalemia - replace orally   Consultants: Pulmonary critical care, urology, cardiology Procedures:  ECHO 8/16 Impressions:  - LVEF 60-65%, mild LVH, normal wall motion, diastolic dysfunction (gr 1) elevated LV filling pressure, mild TR, elevated RVSP at 37 mmHg.   Discharge Exam: Filed Weights   12/11/14 0500 12/12/14 0417 12/13/14 0503  Weight: 69 kg (152 lb 1.9 oz) 68.947 kg (152 lb) 70.262 kg (154 lb 14.4 oz)   Filed Vitals:   12/13/14 0503  BP: 142/62  Pulse: 85  Temp: 98.3 F (36.8 C)  Resp: 18    General: AAO x 3, no distress Cardiovascular: RRR, no murmurs  Respiratory: clear to auscultation bilaterally GI: soft, non-tender, non-distended, bowel sound positive  Discharge Instructions You were cared for by a hospitalist during your hospital stay. If you have any questions about your discharge medications or the care you  received while you were in the hospital after you are discharged, you can call the unit and asked to speak with the hospitalist on call if the hospitalist that took care of you is not available. Once you are discharged, your primary care physician will handle any further medical issues. Please note that NO REFILLS for any discharge medications will be authorized once you  are discharged, as it is imperative that you return to your primary care physician (or establish a relationship with a primary care physician if you do not have one) for your aftercare needs so that they can reassess your need for medications and monitor your lab values.     Medication List    STOP taking these medications        sulfamethoxazole-trimethoprim 800-160 MG per tablet  Commonly known as:  BACTRIM DS,SEPTRA DS      TAKE these medications        aspirin 81 MG tablet  Take 81 mg by mouth daily.     cholecalciferol 1000 UNITS tablet  Commonly known as:  VITAMIN D  Take 1,000 Units by mouth daily.     FIRMAGON 80 MG injection  Generic drug:  degarelix  Inject 80 mg into the skin every 28 (twenty-eight) days.     lidocaine 2 % jelly  Commonly known as:  XYLOCAINE  Place 1 application into the urethra once.     liver oil-zinc oxide 40 % ointment  Commonly known as:  DESITIN  Apply 1 application topically 2 (two) times daily. To sores on bottom.     Miconazole Nitrate 2 % Aero  Apply 1 application topically daily as needed (athlete's foot).     MULTIVITAMIN ADULT PO  Take 1 tablet by mouth daily.     nystatin cream  Commonly known as:  MYCOSTATIN  Apply 1 application topically 2 (two) times daily. To sores on bottom.     polyethylene glycol packet  Commonly known as:  MIRALAX / GLYCOLAX  Take 17 g by mouth daily as needed for mild constipation. Hold if develops diarrhea     senna-docusate 8.6-50 MG per tablet  Commonly known as:  Senokot-S  Take 1 tablet by mouth at bedtime as needed for mild constipation.     tamsulosin 0.4 MG Caps capsule  Commonly known as:  FLOMAX  Take 1 capsule (0.4 mg total) by mouth daily after supper. Start 3 days, prior to follow up with urology       No Known Allergies    The results of significant diagnostics from this hospitalization (including imaging, microbiology, ancillary and laboratory) are listed below for  reference.    Significant Diagnostic Studies: US Renal  12/10/2014   CLINICAL DATA:  Acute renal failure. Sepsis. Elevated BUN and creatinine.  EXAM: RENAL / URINARY TRACT ULTRASOUND COMPLETE  COMPARISON:  CT abdomen and pelvis 10/03/2014. Ultrasound kidneys 09/29/2014.  FINDINGS: Right Kidney:  Length: 10.3 cm. Diffuse parenchymal thinning consistent with medical renal disease. Moderate hydronephrosis, similar prior study.  Left Kidney:  Length: 9.5 cm. Diffuse parenchymal thinning consistent with medical renal disease. Mild hydronephrosis, similar to prior study.  Bladder:  Bladder is decompressed with a Foley catheter and is not evaluated.  IMPRESSION: Diffuse parenchymal thinning bilaterally consistent with medical renal disease. Bilateral hydronephrosis similar prior study.   Electronically Signed   By: Lucienne Capers M.D.   On: 12/10/2014 00:09   Dg Chest Port 1 View  12/11/2014   CLINICAL DATA:  Pulmonary infiltrates.  EXAM: PORTABLE CHEST - 1 VIEW  COMPARISON:  12/09/2014.  FINDINGS: Patient is rotated to the right. Heart size normal. Diffuse bilateral pulmonary infiltrates consistent with bilateral pulmonary edema and/or pneumonia. Small bilateral effusions. No pneumothorax. No acute bony abnormality. Degenerative changes thoracic spine.  IMPRESSION: New onset of diffuse bilateral pulmonary alveolar infiltrates consistent with pulmonary edema and/or bilateral pneumonia. Small bilateral pleural effusions are also present.   Electronically Signed   By: Marcello Moores  Register   On: 12/11/2014 07:14   Dg Chest Port 1 View  12/09/2014   CLINICAL DATA:  Patient with history of fever. History of basal cell carcinoma and prostate cancer.  EXAM: PORTABLE CHEST - 1 VIEW  COMPARISON:  None.  FINDINGS: Monitoring leads overlie the patient. Normal cardiac and mediastinal contours. No large area of pulmonary consolidation. Coarse interstitial opacities. Biapical pleural parenchymal thickening. Low lung volumes. No  pleural effusion or pneumothorax.  IMPRESSION: Interstitial opacities may represent component of atypical infection, chronic pulmonary changes or edema.  Biapical pleural parenchymal thickening.  Low lung volumes.   Electronically Signed   By: Lovey Newcomer M.D.   On: 12/09/2014 18:45    Microbiology: Recent Results (from the past 240 hour(s))  Culture, blood (routine x 2)     Status: None (Preliminary result)   Collection Time: 12/09/14  6:00 PM  Result Value Ref Range Status   Specimen Description BLOOD RIGHT ANTECUBITAL  Final   Special Requests BOTTLES DRAWN AEROBIC AND ANAEROBIC 5CC  Final   Culture  Setup Time   Final    GRAM NEGATIVE RODS IN BOTH AEROBIC AND ANAEROBIC BOTTLES CRITICAL RESULT CALLED TO, READ BACK BY AND VERIFIED WITH: AKOONTZ 0713@08 /24/16 MKELLY    Culture   Final    ESCHERICHIA COLI Performed at [facility]    Report Status PENDING  Incomplete   Organism ID, Bacteria ESCHERICHIA COLI  Final      Susceptibility   Escherichia coli - MIC*    AMPICILLIN <=2 SENSITIVE Sensitive     CEFAZOLIN <=4 SENSITIVE Sensitive     CEFEPIME <=1 SENSITIVE Sensitive     CEFTAZIDIME <=1 SENSITIVE Sensitive     CEFTRIAXONE <=1 SENSITIVE Sensitive     CIPROFLOXACIN <=0.25 SENSITIVE Sensitive     GENTAMICIN <=1 SENSITIVE Sensitive     IMIPENEM <=0.25 SENSITIVE Sensitive     TRIMETH/SULFA <=20 SENSITIVE Sensitive     AMPICILLIN/SULBACTAM <=2 SENSITIVE Sensitive     PIP/TAZO <=4 SENSITIVE Sensitive     * ESCHERICHIA COLI  Culture, blood (routine x 2)     Status: None (Preliminary result)   Collection Time: 12/09/14  6:07 PM  Result Value Ref Range Status   Specimen Description BLOOD RIGHT FOREARM  Final   Special Requests BOTTLES DRAWN AEROBIC AND ANAEROBIC 5CC EACH  Final   Culture  Setup Time   Final    GRAM NEGATIVE RODS IN BOTH AEROBIC AND ANAEROBIC BOTTLES CRITICAL RESULT CALLED TO, READ BACK BY AND VERIFIED WITH: AKOONTZ 0713 @08 /24/16 MKELLY    Culture    Final    ESCHERICHIA COLI SUSCEPTIBILITIES PERFORMED ON PREVIOUS CULTURE WITHIN THE LAST 5 DAYS. Performed at [facility]    Report Status PENDING  Incomplete  MRSA PCR Screening     Status: None   Collection Time: 12/09/14  9:14 PM  Result Value Ref Range Status   MRSA by PCR NEGATIVE NEGATIVE Final    Comment:        The GeneXpert MRSA Assay (FDA approved  for NASAL specimens only), is one component of a comprehensive MRSA colonization surveillance program. It is not intended to diagnose MRSA infection nor to guide or monitor treatment for MRSA infections.   Urine culture     Status: None   Collection Time: 12/10/14 12:14 AM  Result Value Ref Range Status   Specimen Description URINE, RANDOM  Final   Special Requests NONE  Final   Culture   Final    >=100,000 COLONIES/mL PROTEUS MIRABILIS >=100,000 COLONIES/mL ESCHERICHIA COLI SUSCEPTIBILITIES PERFORMED ON PREVIOUS CULTURE WITHIN THE LAST 5 DAYS. Performed at [facility]    Report Status 12/12/2014 FINAL  Final  Urine culture     Status: None   Collection Time: 12/10/14  4:53 AM  Result Value Ref Range Status   Specimen Description URINE, RANDOM  Final   Special Requests NONE  Final   Culture   Final    50,000 COLONIES/mL PROTEUS MIRABILIS 60,000 COLONIES/ml ESCHERICHIA COLI Performed at [facility]    Report Status 12/12/2014 FINAL  Final   Organism ID, Bacteria PROTEUS MIRABILIS  Final   Organism ID, Bacteria ESCHERICHIA COLI  Final      Susceptibility   Escherichia coli - MIC*    AMPICILLIN <=2 SENSITIVE Sensitive     CEFAZOLIN <=4 SENSITIVE Sensitive     CEFTRIAXONE <=1 SENSITIVE Sensitive     CIPROFLOXACIN <=0.25 SENSITIVE Sensitive     GENTAMICIN <=1 SENSITIVE Sensitive     IMIPENEM <=0.25 SENSITIVE Sensitive     NITROFURANTOIN <=16 SENSITIVE Sensitive     TRIMETH/SULFA <=20 SENSITIVE Sensitive     AMPICILLIN/SULBACTAM <=2 SENSITIVE Sensitive     PIP/TAZO <=4 SENSITIVE Sensitive      * 60,000 COLONIES/ml ESCHERICHIA COLI   Proteus mirabilis - MIC*    AMPICILLIN <=2 SENSITIVE Sensitive     CEFAZOLIN <=4 SENSITIVE Sensitive     CEFTRIAXONE <=1 SENSITIVE Sensitive     CIPROFLOXACIN <=0.25 SENSITIVE Sensitive     GENTAMICIN <=1 SENSITIVE Sensitive     IMIPENEM 1 SENSITIVE Sensitive     NITROFURANTOIN 128 RESISTANT Resistant     TRIMETH/SULFA <=20 SENSITIVE Sensitive     AMPICILLIN/SULBACTAM <=2 SENSITIVE Sensitive     PIP/TAZO <=4 SENSITIVE Sensitive     * 50,000 COLONIES/mL PROTEUS MIRABILIS     Labs: Basic Metabolic Panel:  Recent Labs Lab 12/10/14 0140 12/10/14 0719 12/11/14 0330 12/12/14 0446 12/13/14 0515  NA 135 137 136 139 139  K 4.5 4.2 3.7 3.2* 3.7  CL 106 113* 111 114* 113*  CO2 20* 20* 20* 19* 22  GLUCOSE 115* 105* 95 78 90  BUN 47* 44* 35* 27* 22*  CREATININE 1.95* 1.72* 1.41* 1.24 1.11  CALCIUM 7.6* 7.2* 7.1* 7.4* 7.9*   Liver Function Tests:  Recent Labs Lab 12/09/14 1813 12/10/14 0140 12/10/14 0719  AST 36 280* 199*  ALT 17 148* 129*  ALKPHOS 77 65 61  BILITOT 0.6 0.7 0.7  PROT 6.8 4.9* 5.1*  ALBUMIN 3.7 2.6* 2.6*   No results for input(s): LIPASE, AMYLASE in the last 168 hours. No results for input(s): AMMONIA in the last 168 hours. CBC:  Recent Labs Lab 12/09/14 1813 12/10/14 0140 12/10/14 0719 12/11/14 0330 12/12/14 0446  WBC 6.2 12.7* 18.8* 14.6* 8.1  NEUTROABS 5.8 11.7* 17.6*  --   --   HGB 10.7* 8.8* 8.8* 8.7* 8.5*  HCT 33.3* 27.4* 27.9* 26.7* 26.0*  MCV 91.2 91.3 92.7 91.1 91.2  PLT 272 181 183 173 168  Cardiac Enzymes:  Recent Labs Lab 12/10/14 0510 12/10/14 0719 12/10/14 1209 12/10/14 1800 12/12/14 0446  TROPONINI 1.55* 1.62* 2.25* 2.87* 1.91*   BNP: BNP (last 3 results) No results for input(s): BNP in the last 8760 hours.  ProBNP (last 3 results) No results for input(s): PROBNP in the last 8760 hours.  CBG: No results for input(s): GLUCAP in the last 168  hours.     SignedDebbe Odea, MD [facility] 12/13/2014, 7:32 AM

## 2014-12-13 NOTE — Progress Notes (Signed)
ANTIBIOTIC CONSULT NOTE - INITIAL  Pharmacy Consult for Ampicillin Indication: Bacteremia  No Known Allergies  Patient Measurements: Height: 5\' 8"  (172.7 cm) Weight: 154 lb 14.4 oz (70.262 kg) IBW/kg (Calculated) : 68.4  Vital Signs: Temp: 98.3 F (36.8 C) (08/27 0503) Temp Source: Oral (08/27 0503) BP: 142/62 mmHg (08/27 0503) Pulse Rate: 85 (08/27 0503) Intake/Output from previous day: 08/26 0701 - 08/27 0700 In: 949.6 [P.O.:580; I.V.:219.6; IV Piggyback:150] Out: 1400 [Urine:1400] Intake/Output from this shift:    Labs:  Recent Labs  12/11/14 0330 12/12/14 0446 12/13/14 0515  WBC 14.6* 8.1  --   HGB 8.7* 8.5*  --   PLT 173 168  --   CREATININE 1.41* 1.24 1.11   Estimated Creatinine Clearance: 39.4 mL/min (by C-G formula based on Cr of 1.11). No results for input(s): VANCOTROUGH, VANCOPEAK, VANCORANDOM, GENTTROUGH, GENTPEAK, GENTRANDOM, TOBRATROUGH, TOBRAPEAK, TOBRARND, AMIKACINPEAK, AMIKACINTROU, AMIKACIN in the last 72 hours.   Microbiology: Recent Results (from the past 720 hour(s))  Culture, blood (routine x 2)     Status: None (Preliminary result)   Collection Time: 12/09/14  6:00 PM  Result Value Ref Range Status   Specimen Description BLOOD RIGHT ANTECUBITAL  Final   Special Requests BOTTLES DRAWN AEROBIC AND ANAEROBIC 5CC  Final   Culture  Setup Time   Final    GRAM NEGATIVE RODS IN BOTH AEROBIC AND ANAEROBIC BOTTLES CRITICAL RESULT CALLED TO, READ BACK BY AND VERIFIED WITH: AKOONTZ 0713@08 /24/16 MKELLY    Culture   Final    ESCHERICHIA COLI Performed at [facility]    Report Status PENDING  Incomplete   Organism ID, Bacteria ESCHERICHIA COLI  Final      Susceptibility   Escherichia coli - MIC*    AMPICILLIN <=2 SENSITIVE Sensitive     CEFAZOLIN <=4 SENSITIVE Sensitive     CEFEPIME <=1 SENSITIVE Sensitive     CEFTAZIDIME <=1 SENSITIVE Sensitive     CEFTRIAXONE <=1 SENSITIVE Sensitive     CIPROFLOXACIN <=0.25 SENSITIVE Sensitive    GENTAMICIN <=1 SENSITIVE Sensitive     IMIPENEM <=0.25 SENSITIVE Sensitive     TRIMETH/SULFA <=20 SENSITIVE Sensitive     AMPICILLIN/SULBACTAM <=2 SENSITIVE Sensitive     PIP/TAZO <=4 SENSITIVE Sensitive     * ESCHERICHIA COLI  Culture, blood (routine x 2)     Status: None (Preliminary result)   Collection Time: 12/09/14  6:07 PM  Result Value Ref Range Status   Specimen Description BLOOD RIGHT FOREARM  Final   Special Requests BOTTLES DRAWN AEROBIC AND ANAEROBIC 5CC EACH  Final   Culture  Setup Time   Final    GRAM NEGATIVE RODS IN BOTH AEROBIC AND ANAEROBIC BOTTLES CRITICAL RESULT CALLED TO, READ BACK BY AND VERIFIED WITH: AKOONTZ 8185 @08 /24/16 MKELLY    Culture   Final    ESCHERICHIA COLI SUSCEPTIBILITIES PERFORMED ON PREVIOUS CULTURE WITHIN THE LAST 5 DAYS. Performed at [facility]    Report Status PENDING  Incomplete  MRSA PCR Screening     Status: None   Collection Time: 12/09/14  9:14 PM  Result Value Ref Range Status   MRSA by PCR NEGATIVE NEGATIVE Final    Comment:        The GeneXpert MRSA Assay (FDA approved for NASAL specimens only), is one component of a comprehensive MRSA colonization surveillance program. It is not intended to diagnose MRSA infection nor to guide or monitor treatment for MRSA infections.   Urine culture     Status: None  Collection Time: 12/10/14 12:14 AM  Result Value Ref Range Status   Specimen Description URINE, RANDOM  Final   Special Requests NONE  Final   Culture   Final    >=100,000 COLONIES/mL PROTEUS MIRABILIS >=100,000 COLONIES/mL ESCHERICHIA COLI SUSCEPTIBILITIES PERFORMED ON PREVIOUS CULTURE WITHIN THE LAST 5 DAYS. Performed at [facility]    Report Status 12/12/2014 FINAL  Final  Urine culture     Status: None   Collection Time: 12/10/14  4:53 AM  Result Value Ref Range Status   Specimen Description URINE, RANDOM  Final   Special Requests NONE  Final   Culture   Final    50,000 COLONIES/mL PROTEUS  MIRABILIS 60,000 COLONIES/ml ESCHERICHIA COLI Performed at [facility]    Report Status 12/12/2014 FINAL  Final   Organism ID, Bacteria PROTEUS MIRABILIS  Final   Organism ID, Bacteria ESCHERICHIA COLI  Final      Susceptibility   Escherichia coli - MIC*    AMPICILLIN <=2 SENSITIVE Sensitive     CEFAZOLIN <=4 SENSITIVE Sensitive     CEFTRIAXONE <=1 SENSITIVE Sensitive     CIPROFLOXACIN <=0.25 SENSITIVE Sensitive     GENTAMICIN <=1 SENSITIVE Sensitive     IMIPENEM <=0.25 SENSITIVE Sensitive     NITROFURANTOIN <=16 SENSITIVE Sensitive     TRIMETH/SULFA <=20 SENSITIVE Sensitive     AMPICILLIN/SULBACTAM <=2 SENSITIVE Sensitive     PIP/TAZO <=4 SENSITIVE Sensitive     * 60,000 COLONIES/ml ESCHERICHIA COLI   Proteus mirabilis - MIC*    AMPICILLIN <=2 SENSITIVE Sensitive     CEFAZOLIN <=4 SENSITIVE Sensitive     CEFTRIAXONE <=1 SENSITIVE Sensitive     CIPROFLOXACIN <=0.25 SENSITIVE Sensitive     GENTAMICIN <=1 SENSITIVE Sensitive     IMIPENEM 1 SENSITIVE Sensitive     NITROFURANTOIN 128 RESISTANT Resistant     TRIMETH/SULFA <=20 SENSITIVE Sensitive     AMPICILLIN/SULBACTAM <=2 SENSITIVE Sensitive     PIP/TAZO <=4 SENSITIVE Sensitive     * 50,000 COLONIES/mL PROTEUS MIRABILIS   Medical History: Past Medical History  Diagnosis Date  . Basal cell carcinoma   . Laceration of arm 02/04/2013  . HOH (hard of hearing)    Medications:  Scheduled:  . aspirin EC  81 mg Oral Daily  . heparin subcutaneous  5,000 Units Subcutaneous 3 times per day   Anti-infectives    Start     Dose/Rate Route Frequency Ordered Stop   12/10/14 2200  vancomycin (VANCOCIN) IVPB 750 mg/150 ml premix  Status:  Discontinued     750 mg 150 mL/hr over 60 Minutes Intravenous Every 24 hours 12/10/14 0159 12/10/14 1445   12/10/14 1800  azithromycin (ZITHROMAX) 500 mg in dextrose 5 % 250 mL IVPB  Status:  Discontinued     500 mg 250 mL/hr over 60 Minutes Intravenous Every 24 hours 12/10/14 0418 12/10/14  1451   12/10/14 0000  piperacillin-tazobactam (ZOSYN) IVPB 3.375 g  Status:  Discontinued     3.375 g 12.5 mL/hr over 240 Minutes Intravenous 3 times per day 12/09/14 2230 12/13/14 0729   12/09/14 2230  vancomycin (VANCOCIN) IVPB 1000 mg/200 mL premix     1,000 mg 200 mL/hr over 60 Minutes Intravenous  Once 12/09/14 2223 12/09/14 2338   12/09/14 1915  azithromycin (ZITHROMAX) 500 mg in dextrose 5 % 250 mL IVPB     500 mg 250 mL/hr over 60 Minutes Intravenous  Once 12/09/14 1914 12/09/14 2033   12/09/14 1845  piperacillin-tazobactam (ZOSYN)  IVPB 3.375 g     3.375 g 12.5 mL/hr over 240 Minutes Intravenous  Once 12/09/14 1835 12/09/14 1933     Assessment: 89 yoM admit 8/23 with fever, AMS. Completed 3 days Zosyn for sepsis, blood cultures with pan-sensitive E coli, urine with pan-sensitive E coli, Proteus.  All sensitive to Ampicillin.  Day 4 abx, Day 1 Ampicillin with pharmacy to dose  Cl ~ 40 ml/min  Goal of Therapy:  Eradication of infection, dose adjustment for renal function  Plan:   Ampicillin 2gm q6hr  Monitor renal function, clinical course  Thank you,  Minda Ditto PharmD Pager [phone] 12/13/2014, 7:42 AM

## 2014-12-13 NOTE — Care Management (Signed)
Discussed home health care services with patient's daughter and son-in-law. Informed [name] is aware of the patient's discharge date of today. Son-in-law requested that [place] expedite the initial visit to the patient's home. CM will notify Butch Penny at [place] of the request. Venita Sheffield RN BSN CCM

## 2014-12-13 NOTE — Progress Notes (Signed)
Patient ID: Anthony Nicholson, male   DOB: 28-Oct-1917, 81 y.o.   MRN: [id number]    Subjective: Anthony Nicholson is doing well. The suprapubic pain has resolved. Urine culture grew E coli and Proteus which are both sensitive to bactrim.  ROS:  Review of Systems  Unable to perform ROS: other    Anti-infectives: Anti-infectives    Start     Dose/Rate Route Frequency Ordered Stop   12/13/14 1000  ampicillin (OMNIPEN) 2 g in sodium chloride 0.9 % 50 mL IVPB  Status:  Discontinued     2 g 150 mL/hr over 20 Minutes Intravenous Every 6 hours 12/13/14 0745 12/13/14 0831   12/13/14 0900  amoxicillin (AMOXIL) capsule 500 mg     500 mg Oral 3 times per day 12/13/14 0848     12/10/14 2200  vancomycin (VANCOCIN) IVPB 750 mg/150 ml premix  Status:  Discontinued     750 mg 150 mL/hr over 60 Minutes Intravenous Every 24 hours 12/10/14 0159 12/10/14 1445   12/10/14 1800  azithromycin (ZITHROMAX) 500 mg in dextrose 5 % 250 mL IVPB  Status:  Discontinued     500 mg 250 mL/hr over 60 Minutes Intravenous Every 24 hours 12/10/14 0418 12/10/14 1451   12/10/14 0000  piperacillin-tazobactam (ZOSYN) IVPB 3.375 g  Status:  Discontinued     3.375 g 12.5 mL/hr over 240 Minutes Intravenous 3 times per day 12/09/14 2230 12/13/14 0729   12/09/14 2230  vancomycin (VANCOCIN) IVPB 1000 mg/200 mL premix     1,000 mg 200 mL/hr over 60 Minutes Intravenous  Once 12/09/14 2223 12/09/14 2338   12/09/14 1915  azithromycin (ZITHROMAX) 500 mg in dextrose 5 % 250 mL IVPB     500 mg 250 mL/hr over 60 Minutes Intravenous  Once 12/09/14 1914 12/09/14 2033   12/09/14 1845  piperacillin-tazobactam (ZOSYN) IVPB 3.375 g     3.375 g 12.5 mL/hr over 240 Minutes Intravenous  Once 12/09/14 1835 12/09/14 1933      Current Facility-Administered Medications  Medication Dose Route Frequency Provider Last Rate Last Dose  . acetaminophen (TYLENOL) tablet 650 mg  650 mg Oral Q6H PRN Rise Patience, MD   650 mg at 12/09/14 2331   Or  .  acetaminophen (TYLENOL) suppository 650 mg  650 mg Rectal Q6H PRN Rise Patience, MD      . amoxicillin (AMOXIL) capsule 500 mg  500 mg Oral 3 times per day Minda Ditto, RPH      . aspirin EC tablet 81 mg  81 mg Oral Daily Dayna N Dunn, PA-C   81 mg at 12/12/14 0950  . heparin injection 5,000 Units  5,000 Units Subcutaneous 3 times per day Thomes Lolling, RPH   5,000 Units at 12/13/14 0530  . morphine 2 MG/ML injection 0.5 mg  0.5 mg Intravenous Q4H PRN Rise Patience, MD      . neomycin-bacitracin-polymyxin (NEOSPORIN) ointment   Topical PRN Star Age, MD      . ondansetron [facility]) tablet 4 mg  4 mg Oral Q6H PRN Rise Patience, MD       Or  . ondansetron [facility]) injection 4 mg  4 mg Intravenous Q6H PRN Rise Patience, MD      . tamsulosin (FLOMAX) capsule 0.4 mg  0.4 mg Oral Daily Cleon Gustin, MD         Objective: Vital signs in last 24 hours: Temp:  [97.6 F (36.4 C)-98.4 F (36.9 C)] 98.3 F (  36.8 C) (08/27 0503) Pulse Rate:  [56-86] 85 (08/27 0503) Resp:  [18] 18 (08/27 0503) BP: (132-153)/(52-62) 142/62 mmHg (08/27 0503) SpO2:  [95 %-98 %] 95 % (08/27 0503) Weight:  [70.262 kg (154 lb 14.4 oz)] 70.262 kg (154 lb 14.4 oz) (08/27 0503)  Intake/Output from previous day: 08/26 0701 - 08/27 0700 In: 949.6 [P.O.:580; I.V.:219.6; IV Piggyback:150] Out: 1400 [Urine:1400] Intake/Output this shift:     Physical Exam  Lab Results:   Recent Labs  12/11/14 0330 12/12/14 0446  WBC 14.6* 8.1  HGB 8.7* 8.5*  HCT 26.7* 26.0*  PLT 173 168   BMET  Recent Labs  12/12/14 0446 12/13/14 0515  NA 139 139  K 3.2* 3.7  CL 114* 113*  CO2 19* 22  GLUCOSE 78 90  BUN 27* 22*  CREATININE 1.24 1.11  CALCIUM 7.4* 7.9*   PT/INR No results for input(s): LABPROT, INR in the last 72 hours. ABG No results for input(s): PHART, HCO3 in the last 72 hours.  Invalid input(s): PCO2, PO2  Studies/Results: No results found.   Assessment and  Plan:  81yo with prostate cancer, retention and UTI  Recs: 1. flomax 0.4mg  daily 2. Bacrim DS BID for 10 days 3. Pt to followup with Dr. Jeffie Nicholson on 8/31 for urodynamics. This appointment is already schedule and patient and daughter are aware of the appointment. The pt already has scripts for flomax and bactrim at home which he is instructed to fill.      LOS: 4 days    [place], [place] 12/13/2014 [phone]

## 2014-12-14 LAB — CULTURE, BLOOD (ROUTINE X 2)

## 2015-01-16 ENCOUNTER — Emergency Department (HOSPITAL_COMMUNITY)
Admission: EM | Admit: 2015-01-16 | Discharge: 2015-01-16 | Disposition: A | Discharge status: Home / Self Care | Payer: Medicare Other | Attending: Emergency Medicine | Admitting: Emergency Medicine

## 2015-01-16 ENCOUNTER — Encounter (HOSPITAL_COMMUNITY): Payer: Self-pay | Admitting: Emergency Medicine

## 2015-01-16 DIAGNOSIS — N390 Urinary tract infection, site not specified: Secondary | ICD-10-CM | POA: Diagnosis not present

## 2015-01-16 DIAGNOSIS — H9190 Unspecified hearing loss, unspecified ear: Secondary | ICD-10-CM | POA: Diagnosis not present

## 2015-01-16 DIAGNOSIS — Z87828 Personal history of other (healed) physical injury and trauma: Secondary | ICD-10-CM | POA: Diagnosis not present

## 2015-01-16 DIAGNOSIS — Z79899 Other long term (current) drug therapy: Secondary | ICD-10-CM | POA: Insufficient documentation

## 2015-01-16 DIAGNOSIS — Z85828 Personal history of other malignant neoplasm of skin: Secondary | ICD-10-CM | POA: Insufficient documentation

## 2015-01-16 DIAGNOSIS — Z7982 Long term (current) use of aspirin: Secondary | ICD-10-CM | POA: Insufficient documentation

## 2015-01-16 DIAGNOSIS — R339 Retention of urine, unspecified: Secondary | ICD-10-CM | POA: Diagnosis present

## 2015-01-16 LAB — BASIC METABOLIC PANEL
Anion gap: 5 (ref 5–15)
BUN: 39 mg/dL — AB (ref 6–20)
CALCIUM: 8.5 mg/dL — AB (ref 8.9–10.3)
CO2: 28 mmol/L (ref 22–32)
CREATININE: 1.08 mg/dL (ref 0.61–1.24)
Chloride: 105 mmol/L (ref 101–111)
GFR calc Af Amer: >60 mL/min (ref >60)
GFR, EST NON AFRICAN AMERICAN: 57 mL/min — AB (ref >60)
GLUCOSE: 88 mg/dL (ref 65–99)
POTASSIUM: 4.4 mmol/L (ref 3.5–5.1)
SODIUM: 138 mmol/L (ref 135–145)

## 2015-01-16 LAB — URINALYSIS, ROUTINE W REFLEX MICROSCOPIC
BILIRUBIN URINE: NEGATIVE
Glucose, UA: NEGATIVE mg/dL
Ketones, ur: NEGATIVE mg/dL
Nitrite: POSITIVE — AB
PH: 8 (ref 5.0–8.0)
Protein, ur: 100 mg/dL — AB
SPECIFIC GRAVITY, URINE: 1.014 (ref 1.005–1.030)
Urobilinogen, UA: 0.2 mg/dL (ref 0.0–1.0)

## 2015-01-16 LAB — URINE MICROSCOPIC-ADD ON

## 2015-01-16 LAB — CBC
HCT: 27.6 % — ABNORMAL LOW (ref 39.0–52.0)
Hemoglobin: 8.7 g/dL — ABNORMAL LOW (ref 13.0–17.0)
MCH: 28.4 pg (ref 26.0–34.0)
MCHC: 31.5 g/dL (ref 30.0–36.0)
MCV: 90.2 fL (ref 78.0–100.0)
PLATELETS: 259 10*3/uL (ref 150–400)
RBC: 3.06 MIL/uL — ABNORMAL LOW (ref 4.22–5.81)
RDW: 14.4 % (ref 11.5–15.5)
WBC: 7.6 10*3/uL (ref 4.0–10.5)

## 2015-01-16 MED ORDER — LIDOCAINE HCL 2 % EX GEL
CUTANEOUS | Status: AC
  Administered 2015-01-16: 1
  Filled 2015-01-16: qty 5

## 2015-01-16 MED ORDER — CEPHALEXIN 500 MG PO CAPS: 500.0000 mg | ORAL_CAPSULE | Freq: Three times a day (TID) | ORAL | Status: DC

## 2015-01-16 MED ORDER — DEXTROSE 5 % IV SOLN
1.0000 g | Freq: Once | INTRAVENOUS | Status: AC
  Administered 2015-01-16: 1 g via INTRAVENOUS
  Filled 2015-01-16: qty 10

## 2015-01-16 NOTE — Discharge Instructions (Signed)

## 2015-01-16 NOTE — Care Management Note (Addendum)
Case Management Note  Patient Details  Name: Anthony Nicholson MRN: [id number] Date of Birth: 03/28/1918  Subjective/Objective:     81 yr old medicare [place] covered pt of Anthony Nicholson  Pt with hearing aid in left ear No family present in his room Pt states [facility], left to go to snf, arrived home about a month with home health RN & PT and a person hired to provided cleaning and ADL assistance.  Reports he was recently taken back to [facility] with male family member Reports [place] wanted him to go to [facility] hospital because of issues with "my penis" but he did not want to be hospitalized and go through the same process since he had just "done it already"  Pt mentioned his daughter, Anthony Nicholson and someone name Louie Casa that helps him.  Pt agreed to have [facility] to continue to assist him Pt reports he last saw [place] staff "day before yesterday" indicating Wednesday 01/14/15 ED RN offered Number for daughter Anthony Nicholson Pt states his left ankle is red and swollen Request ED Cm have dr to look at it              Action/Plan: CM spoke with ED SW about pt case.  Cm assessed pt  CM reviewed in details medicare guidelines, home health [facility]) (length of stay in home, types of [facility] staff available, coverage, primary caregiver, up to 24 hrs before services may be started) and Private duty nursing (PDN-coverage, length of stay in the home types of staff available). CM reviewed availability of [place] SW to assist pcp to get pt to snf (if desired disposition) from the community level. CM provided pt with a list of [place] home health agencies and PDN.  Placed in his pt belonging bag and wrote how each is paid for either by medicare or out of pocket CM requested ED RN call daughter to discuss pt d/c home  Cm spoke with EDP about home health orders  1225 Cm spoke with Arlyn Leak about Referral for [facility], Pt, OT, NA, SW with interest in increases services  1227 Cm informed EDP of pt request for left ankle to be  looked at - Cm noted only swelling of left ankle when Cm pulled off sock  Expected Discharge Date:       January 16 2015            Expected Discharge Plan:    Home health services resumption or restart with [facility] Slipper Referral:   ED SW  Discharge planning Services    Home health services resumption or restart with [name]  Post Acute Care Choice:   Home health services resumption or restart with [name] Choice offered to:   pt  DME Arranged:   na DME Agency:   na  HH Arranged:   [facility] HH Agency:   [name]   Status of Service:   completed If discussed at [place] of Stay Meetings, dates discussed:    Additional Comments:  Robbie Lis, RN 01/16/2015, 12:24 PM

## 2015-01-16 NOTE — ED Provider Notes (Signed)
CSN: [id number]     Arrival date & time 01/16/15  7939 History   First MD Initiated Contact with Patient 01/16/15 [phone]     Chief Complaint  Patient presents with  . Urinary Retention     (Consider location/radiation/quality/duration/timing/severity/associated sxs/prior Treatment) HPI  The pt is a 81 y/o male who has hx of prostate CA dx when he was admitted to the hospital for urinary retention and acute renal failure. He had a catheter placed, renal function improved, he was discharged home to live in his independent care facility. He returned one month ago with urinary sepsis and had a several day stay in the ICU, was successfully discharged and has done well throughout the month however over the last 24 hours he has developed recurrent urinary retention, what he describes as pain in his penis, this was relieved when he arrived and had a Foley catheter change after the Foley catheter was unable to be irrigated. He denies abdominal pain, fever, chills, nausea, vomiting or any other complaints and at this time feels back to normal. The family is frustrated knowing that he needs to have advanced care at home and yet does not have the finances to afford this. They request social work consultation to help assist with advanced placement.  Past Medical History  Diagnosis Date  . Basal cell carcinoma   . Laceration of arm 02/04/2013  . HOH (hard of hearing)    Past Surgical History  Procedure Laterality Date  . Incision and drainage Left 02/04/2013    left arm laceration    Dr Doran Durand  .  sharpnel removal    1943    . I&d extremity Left 02/04/2013    Procedure: IRRIGATION AND DEBRIDEMENT Left Arm Laceration with Repair as Necessary;  Surgeon: Wylene Simmer, MD;  Location: [facility];  Service: Orthopedics;  Laterality: Left;  . Prostate biopsy N/A 10/02/2014    Procedure: PROSTATE ULTRASOUND AND BIOPSY ;  Surgeon: Irine Seal, MD;  Location: [facility];  Service: Urology;  Laterality: N/A;  . Cystoscopy N/A  10/02/2014    Procedure: FLEXIBLE CYSTOSCOPY;  Surgeon: Irine Seal, MD;  Location: [facility];  Service: Urology;  Laterality: N/A;   Family History  Problem Relation Age of Onset  . Prostate cancer Neg Hx   . Stroke Father   . Glaucoma Mother    Social History  Substance Use Topics  . Smoking status: Never Smoker   . Smokeless tobacco: Never Used  . Alcohol Use: No    Review of Systems  All other systems reviewed and are negative.     Allergies  Review of patient's allergies indicates no known allergies.  Home Medications   Prior to Admission medications   Medication Sig Start Date End Date Taking? Authorizing Provider  aspirin 81 MG tablet Take 81 mg by mouth daily.   Yes Historical Provider, MD  cholecalciferol (VITAMIN D) 1000 UNITS tablet Take 1,000 Units by mouth daily.    Yes Historical Provider, MD  degarelix (FIRMAGON) 80 MG injection Inject 80 mg into the skin every 28 (twenty-eight) days.   Yes Historical Provider, MD  lidocaine (XYLOCAINE) 2 % jelly Place 1 application into the urethra once.   Yes Historical Provider, MD  liver oil-zinc oxide (DESITIN) 40 % ointment Apply 1 application topically 2 (two) times daily. To sores on bottom.   Yes Historical Provider, MD  Miconazole Nitrate 2 % AERO Apply 1 application topically daily as needed (athlete's foot).   Yes Historical Provider, MD  Multiple Vitamins-Minerals (MULTIVITAMIN ADULT PO) Take 1 tablet by mouth daily.   Yes Historical Provider, MD  nystatin cream (MYCOSTATIN) Apply 1 application topically 2 (two) times daily. To sores on bottom.   Yes Historical Provider, MD  tamsulosin (FLOMAX) 0.4 MG CAPS capsule Take 1 capsule (0.4 mg total) by mouth daily after supper. Start 3 days, prior to follow up with urology 10/10/14  Yes Eugenie Filler, MD  amoxicillin (AMOXIL) 500 MG capsule Take 1 capsule (500 mg total) by mouth every 8 (eight) hours. Patient not taking: Reported on 01/16/2015 12/13/14   Debbe Odea, MD   cephALEXin (KEFLEX) 500 MG capsule Take 1 capsule (500 mg total) by mouth 3 (three) times daily. 01/16/15   Noemi Chapel, MD  polyethylene glycol [facility] Floria Raveling) packet Take 17 g by mouth daily as needed for mild constipation. Hold if develops diarrhea Patient not taking: Reported on 01/16/2015 12/13/14   Debbe Odea, MD  senna-docusate (SENOKOT-S) 8.6-50 MG per tablet Take 1 tablet by mouth at bedtime as needed for mild constipation. Patient not taking: Reported on 01/16/2015 12/13/14   Debbe Odea, MD   BP 117/61 mmHg  Pulse 71  Temp(Src) 97.5 F (36.4 C) (Oral)  Resp 14  SpO2 98% Physical Exam  Constitutional: He appears well-developed and well-nourished. No distress.  HENT:  Head: Normocephalic and atraumatic.  Mouth/Throat: Oropharynx is clear and moist. No oropharyngeal exudate.  Eyes: Conjunctivae and EOM are normal. Pupils are equal, round, and reactive to light. Right eye exhibits no discharge. Left eye exhibits no discharge. No scleral icterus.  Neck: Normal range of motion. Neck supple. No JVD present. No thyromegaly present.  Cardiovascular: Normal rate, regular rhythm, normal heart sounds and intact distal pulses.  Exam reveals no gallop and no friction rub.   No murmur heard. Pulmonary/Chest: Effort normal and breath sounds normal. No respiratory distress. He has no wheezes. He has no rales.  Abdominal: Soft. Bowel sounds are normal. He exhibits no distension and no mass. There is no tenderness.  Genitourinary:  Normal appearing penis, normal scrotum, Foley catheter well-seated in the urethra  Musculoskeletal: Normal range of motion. He exhibits no edema or tenderness.  Lymphadenopathy:    He has no cervical adenopathy.  Neurological: He is alert. Coordination normal.  Skin: Skin is warm and dry. No rash noted. No erythema.  Psychiatric: He has a normal mood and affect. His behavior is normal.  Nursing note and vitals reviewed.   ED Course  Procedures (including  critical care time) Labs Review Labs Reviewed  URINALYSIS, ROUTINE W REFLEX MICROSCOPIC (NOT AT [facility]) - Abnormal; Notable for the following:    APPearance TURBID (*)    Hgb urine dipstick LARGE (*)    Protein, ur 100 (*)    Nitrite POSITIVE (*)    Leukocytes, UA LARGE (*)    All other components within normal limits  URINE MICROSCOPIC-ADD ON - Abnormal; Notable for the following:    Bacteria, UA MANY (*)    All other components within normal limits  BASIC METABOLIC PANEL - Abnormal; Notable for the following:    BUN 39 (*)    Calcium 8.5 (*)    GFR calc non Af Amer 57 (*)    All other components within normal limits  CBC - Abnormal; Notable for the following:    RBC 3.06 (*)    Hemoglobin 8.7 (*)    HCT 27.6 (*)    All other components within normal limits  URINE CULTURE  Imaging Review No results found. I have personally reviewed and evaluated these images and lab results as part of my medical decision-making.   MDM   Final diagnoses:  UTI (lower urinary tract infection)    Abdomen is soft and nondistended, Foley catheter is draining cloudy urine, no murmurs, no tachycardia, no hypotension or fevers. Urinalysis reveals urinary tract infection, complicating his urinary catheter, culture ordered, antibiotics ordered, check renal function, the patient does not appear ill, anticipate discharge but may need advanced placement, social work consult.  SW has seen and there are no placement options - will vie for increased home health involvement - family in agreement.  UTI present  Meds given in ED:  Medications  lidocaine (XYLOCAINE) 2 % jelly (1 application  Given 1/51/76 0616)  cefTRIAXone (ROCEPHIN) 1 g in dextrose 5 % 50 mL IVPB (0 g Intravenous Stopped 01/16/15 0815)    New Prescriptions   CEPHALEXIN (KEFLEX) 500 MG CAPSULE    Take 1 capsule (500 mg total) by mouth 3 (three) times daily.       Noemi Chapel, MD 01/16/15 1225

## 2015-01-16 NOTE — ED Notes (Signed)
Pt states he has a foley catheter in place and it has not drained since before he went to bed last night   Pt is c/o pain to his penis and states he has some swelling  Family states pt was seen here in June or so and was diagnosed with prostate cancer  Pt was here again on August 23rd and was admitted to ICU with sepsis  Pt was seen at his urologist office on Monday and they placed a new catheter

## 2015-01-16 NOTE — ED Notes (Addendum)
Attempt was made to flush pt's foley. This was unsuccessful. Lidocaine gel was used, foley was removed, and a new foley was placed. Pt reported this relieved some of his pressure. Immediate urine return was approximately 254ml. There was obvious sediment and blood tinged urine in the output.

## 2015-01-16 NOTE — Progress Notes (Signed)
CSW met with pt at bedside to complete psychosocial assessment. Patient shares that he lives at [facility], and has been for the past year. Pt states that a girl comes to help 5 days a week, with cooking, laundry, and helping him get in and out of the shower. Patietn states he doesn't have anyone helping with his foley catheter at this time. Patient states he would like to stay in [facility] but requested csw to speak with his daughter. Pt and nurse shared pt daughter went to the cafeteria. CSW waited for a little while and called patient daughter on cell phone and left message.   Belia Heman, [place] Work  [employer] [phone]

## 2015-01-16 NOTE — Progress Notes (Signed)
CSW met with pt daughter and son in law. Pt family inquiring about assisted living and skilled nursing placement, medicaid, and navigating the [place] system. Patient family actively working with Education officer, museum at the [place] however does not qualify for the federal assisted living due to being 30% service connected instead of the required 75%. Patient also does not qualify for medicaid, however does not have enough monthly income for assisted living. Pt currently receives [facility] [phone] Monday-Friday. Pt also receives services from [facility] and are interested in receiving more nursing visits. CSW consulted with rn cm regarding request and will follow up. Pt is seen by dr. Irine Seal at [facility] urology and Dr. Mayra Neer at [facility].   Belia Heman, [place] Work  [employer] [phone]

## 2015-01-16 NOTE — ED Notes (Signed)
Family notified of patient's discharge

## 2015-01-18 LAB — URINE CULTURE: Culture: >=100000

## 2015-01-20 ENCOUNTER — Telehealth (HOSPITAL_COMMUNITY): Payer: Self-pay

## 2015-01-20 NOTE — Telephone Encounter (Signed)
Post ED Visit - Positive Culture Follow-up  Culture report reviewed by antimicrobial stewardship pharmacist:  [x]  Heide Guile, Pharm.D., BCPS []  Alycia Rossetti, Pharm.D., BCPS []  [place], Pharm.D., BCPS, AAHIVP []  Legrand Como, Pharm.D., BCPS, AAHIVP []  [place], Pharm.D. []  Milus Glazier, Pharm.D.  Positive Urine culture, >/= 100,000 colonies -> Proteus Mirabilis Treated with Cephalexin, organism sensitive to the same and no further patient follow-up is required at this time.  Dortha Kern 01/20/2015, 4:55 AM

## 2015-02-28 ENCOUNTER — Encounter (HOSPITAL_COMMUNITY): Payer: Self-pay | Admitting: Emergency Medicine

## 2015-02-28 ENCOUNTER — Emergency Department (HOSPITAL_COMMUNITY)
Admission: EM | Admit: 2015-02-28 | Discharge: 2015-03-01 | Disposition: A | Discharge status: Home / Self Care | Payer: Medicare Other | Attending: Emergency Medicine | Admitting: Emergency Medicine

## 2015-02-28 DIAGNOSIS — Z87828 Personal history of other (healed) physical injury and trauma: Secondary | ICD-10-CM | POA: Diagnosis not present

## 2015-02-28 DIAGNOSIS — R339 Retention of urine, unspecified: Secondary | ICD-10-CM | POA: Diagnosis present

## 2015-02-28 DIAGNOSIS — N390 Urinary tract infection, site not specified: Secondary | ICD-10-CM

## 2015-02-28 DIAGNOSIS — Y658 Other specified misadventures during surgical and medical care: Secondary | ICD-10-CM | POA: Insufficient documentation

## 2015-02-28 DIAGNOSIS — H9190 Unspecified hearing loss, unspecified ear: Secondary | ICD-10-CM | POA: Diagnosis not present

## 2015-02-28 DIAGNOSIS — Z79899 Other long term (current) drug therapy: Secondary | ICD-10-CM | POA: Insufficient documentation

## 2015-02-28 DIAGNOSIS — Z85828 Personal history of other malignant neoplasm of skin: Secondary | ICD-10-CM | POA: Insufficient documentation

## 2015-02-28 DIAGNOSIS — Z8546 Personal history of malignant neoplasm of prostate: Secondary | ICD-10-CM | POA: Insufficient documentation

## 2015-02-28 DIAGNOSIS — T83511A Infection and inflammatory reaction due to indwelling urethral catheter, initial encounter: Secondary | ICD-10-CM | POA: Insufficient documentation

## 2015-02-28 LAB — BASIC METABOLIC PANEL WITH GFR
Anion gap: 10 (ref 5–15)
BUN: 34 mg/dL — ABNORMAL HIGH (ref 6–20)
CO2: 22 mmol/L (ref 22–32)
Calcium: 8.8 mg/dL — ABNORMAL LOW (ref 8.9–10.3)
Chloride: 104 mmol/L (ref 101–111)
Creatinine, Ser: 1.19 mg/dL (ref 0.61–1.24)
GFR calc Af Amer: 58 mL/min — ABNORMAL LOW
GFR calc non Af Amer: 50 mL/min — ABNORMAL LOW
Glucose, Bld: 92 mg/dL (ref 65–99)
Potassium: 4.3 mmol/L (ref 3.5–5.1)
Sodium: 136 mmol/L (ref 135–145)

## 2015-02-28 LAB — CBC WITH DIFFERENTIAL/PLATELET
Basophils Absolute: 0 K/uL (ref 0.0–0.1)
Basophils Relative: 0 %
Eosinophils Absolute: 0.3 K/uL (ref 0.0–0.7)
Eosinophils Relative: 4 %
HCT: 28.4 % — ABNORMAL LOW (ref 39.0–52.0)
Hemoglobin: 8.8 g/dL — ABNORMAL LOW (ref 13.0–17.0)
Lymphocytes Relative: 13 %
Lymphs Abs: 1.2 K/uL (ref 0.7–4.0)
MCH: 26 pg (ref 26.0–34.0)
MCHC: 31 g/dL (ref 30.0–36.0)
MCV: 83.8 fL (ref 78.0–100.0)
Monocytes Absolute: 0.6 K/uL (ref 0.1–1.0)
Monocytes Relative: 7 %
Neutro Abs: 7 K/uL (ref 1.7–7.7)
Neutrophils Relative %: 76 %
Platelets: 267 K/uL (ref 150–400)
RBC: 3.39 MIL/uL — ABNORMAL LOW (ref 4.22–5.81)
RDW: 13.9 % (ref 11.5–15.5)
WBC: 9.2 K/uL (ref 4.0–10.5)

## 2015-02-28 MED ORDER — DEXTROSE 5 % IV SOLN
1.0000 g | Freq: Once | INTRAVENOUS | Status: AC
  Administered 2015-02-28: 1 g via INTRAVENOUS
  Filled 2015-02-28: qty 10

## 2015-02-28 MED ORDER — LIDOCAINE HCL 2 % EX GEL
1.0000 "application " | Freq: Once | CUTANEOUS | Status: AC
  Administered 2015-03-01: 1 via URETHRAL
  Filled 2015-02-28: qty 11

## 2015-02-28 MED ORDER — LIDOCAINE HCL 2 % EX GEL: 1.0000 "application " | Freq: Once | CUTANEOUS | Status: DC

## 2015-02-28 NOTE — ED Notes (Signed)
Attempted to irrigate foley w/o success.  Penis has what appears to be erosion from the foley to the underside of his penis.  Sam, PA made aware.

## 2015-02-28 NOTE — ED Notes (Signed)
Pt from home c/o urinary retention. Indwelling foley catheter. They reports the draining stopped yesterday.  HX of prostate CA. Foul smelling urine noted coming form patient.

## 2015-02-28 NOTE — ED Notes (Signed)
Bladder scan pt had 280ml

## 2015-02-28 NOTE — ED Provider Notes (Signed)
CSN: [id number]     Arrival date & time 02/28/15  2024 History   First MD Initiated Contact with Patient 02/28/15 2151     Chief Complaint  Patient presents with  . Urinary Retention     (Consider location/radiation/quality/duration/timing/severity/associated sxs/prior Treatment) HPI  Anthony Nicholson is a 81 year old male with a past medical history of prostate cancer with an indwelling Foley catheter who presents the emergency department complaining of urinary retention and penile pain. Patient states that he has not made any urine since yesterday. He is now having abdominal distention and penile pain. This has happened once before in September and in that case his Foley catheter was clogged and had to be changed. Patient is also having right drainage from his penis. Denies fever, vomiting, diarrhea, hematuria, abdominal pain.  Past Medical History  Diagnosis Date  . Basal cell carcinoma   . Laceration of arm 02/04/2013  . HOH (hard of hearing)    Past Surgical History  Procedure Laterality Date  . Incision and drainage Left 02/04/2013    left arm laceration    Dr Doran Durand  .  sharpnel removal    1943    . I&d extremity Left 02/04/2013    Procedure: IRRIGATION AND DEBRIDEMENT Left Arm Laceration with Repair as Necessary;  Surgeon: Wylene Simmer, MD;  Location: [facility];  Service: Orthopedics;  Laterality: Left;  . Prostate biopsy N/A 10/02/2014    Procedure: PROSTATE ULTRASOUND AND BIOPSY ;  Surgeon: Irine Seal, MD;  Location: [facility];  Service: Urology;  Laterality: N/A;  . Cystoscopy N/A 10/02/2014    Procedure: FLEXIBLE CYSTOSCOPY;  Surgeon: Irine Seal, MD;  Location: [facility];  Service: Urology;  Laterality: N/A;   Family History  Problem Relation Age of Onset  . Prostate cancer Neg Hx   . Stroke Father   . Glaucoma Mother    Social History  Substance Use Topics  . Smoking status: Never Smoker   . Smokeless tobacco: Never Used  . Alcohol Use: No    Review of Systems   Genitourinary: Negative for flank pain.  All other systems reviewed and are negative.     Allergies  Review of patient's allergies indicates no known allergies.  Home Medications   Prior to Admission medications   Medication Sig Start Date End Date Taking? Authorizing Provider  degarelix (FIRMAGON) 80 MG injection Inject 80 mg into the skin every 28 (twenty-eight) days.   Yes Historical Provider, MD  lidocaine (XYLOCAINE) 2 % jelly Place 1 application into the urethra once.   Yes Historical Provider, MD  Miconazole Nitrate 2 % AERO Apply 1 application topically daily as needed (athlete's foot).   Yes Historical Provider, MD  nystatin cream (MYCOSTATIN) Apply 1 application topically 2 (two) times daily as needed (sores). To sores on bottom.   Yes Historical Provider, MD  amoxicillin (AMOXIL) 500 MG capsule Take 1 capsule (500 mg total) by mouth every 8 (eight) hours. Patient not taking: Reported on 01/16/2015 12/13/14   Debbe Odea, MD  cephALEXin (KEFLEX) 500 MG capsule Take 1 capsule (500 mg total) by mouth 3 (three) times daily. Patient not taking: Reported on 02/28/2015 01/16/15   Noemi Chapel, MD  polyethylene glycol [facility] / Floria Raveling) packet Take 17 g by mouth daily as needed for mild constipation. Hold if develops diarrhea Patient not taking: Reported on 01/16/2015 12/13/14   Debbe Odea, MD  senna-docusate (SENOKOT-S) 8.6-50 MG per tablet Take 1 tablet by mouth at bedtime as needed for mild constipation. Patient  not taking: Reported on 01/16/2015 12/13/14   Debbe Odea, MD  tamsulosin (FLOMAX) 0.4 MG CAPS capsule Take 1 capsule (0.4 mg total) by mouth daily after supper. Start 3 days, prior to follow up with urology Patient not taking: Reported on 02/28/2015 10/10/14   Eugenie Filler, MD   BP 149/71 mmHg  Pulse 91  Temp(Src) 97.7 F (36.5 C) (Oral)  Resp 20  Ht 5\' 4"  (1.626 m)  Wt 130 lb (58.968 kg)  BMI 22.30 kg/m2  SpO2 100% Physical Exam  Constitutional: He is  oriented to person, place, and time. He appears well-developed and well-nourished. No distress.  HENT:  Head: Normocephalic and atraumatic.  Mouth/Throat: No oropharyngeal exudate.  Eyes: Conjunctivae and EOM are normal. Pupils are equal, round, and reactive to light. Right eye exhibits no discharge. Left eye exhibits no discharge. No scleral icterus.  Cardiovascular: Normal rate, regular rhythm, normal heart sounds and intact distal pulses.  Exam reveals no gallop and no friction rub.   No murmur heard. Pulmonary/Chest: Effort normal and breath sounds normal. No respiratory distress. He has no wheezes. He has no rales. He exhibits no tenderness.  Abdominal: Soft. He exhibits distension. There is no tenderness ( suprapubic TTP). There is no guarding.  Genitourinary:  Indwelling foley in place. Purulent discharge draining from the urethral meatus. Tenderness to shaft of penis.    Musculoskeletal: Normal range of motion. He exhibits no edema.  Neurological: He is alert and oriented to person, place, and time.  Skin: Skin is warm and dry. No rash noted. He is not diaphoretic. No erythema. No pallor.  Psychiatric: He has a normal mood and affect. His behavior is normal.  Nursing note and vitals reviewed.   ED Course  Procedures (including critical care time) Labs Review Labs Reviewed  CBC WITH DIFFERENTIAL/PLATELET - Abnormal; Notable for the following:    RBC 3.39 (*)    Hemoglobin 8.8 (*)    HCT 28.4 (*)    All other components within normal limits  BASIC METABOLIC PANEL - Abnormal; Notable for the following:    BUN 34 (*)    Calcium 8.8 (*)    GFR calc non Af Amer 50 (*)    GFR calc Af Amer 58 (*)    All other components within normal limits  URINE CULTURE  URINALYSIS, ROUTINE W REFLEX MICROSCOPIC (NOT AT [facility])    Imaging Review No results found. I have personally reviewed and evaluated these images and lab results as part of my medical decision-making.   EKG  Interpretation None      MDM   Final diagnoses:  Urinary tract infection associated with indwelling urethral catheter, initial encounter    81 y.o M with hx of prostate Ca with in dwelling foley catheter presents c/o no urine output since yesterday, abdominal distention and penile pain. Pt has history of same and reports that his catheter was clogged and needed to be replaced. On exam pt has purulent penile discharge from the meatus. Bladder scan was >238. Will replace foley catheter today.   After removing foley, pt produced significant amount of urine. Pt reports symptomatic improvement. UA reveals grossly infected urine. Pt given 1g Rocephin.  Pt is afebrile. VSS, in NAD. WBC wnl. Pt alert and oriented. Kidney function at baseline. Pt safe to be treated with abx as an outpatient. Pt will follow up with urology tomorrow. Pt family with him and express understanding of treatment.     [place], PA-C 03/01/15 1913  Sherwood Gambler, MD 03/04/15 [phone]

## 2015-02-28 NOTE — ED Notes (Signed)
Bed: [id number] Expected date:  Expected time:  Means of arrival:  Comments: Hold for t2

## 2015-03-01 DIAGNOSIS — T83511A Infection and inflammatory reaction due to indwelling urethral catheter, initial encounter: Secondary | ICD-10-CM | POA: Diagnosis not present

## 2015-03-01 LAB — URINALYSIS, ROUTINE W REFLEX MICROSCOPIC
BILIRUBIN URINE: NEGATIVE
GLUCOSE, UA: NEGATIVE mg/dL
KETONES UR: NEGATIVE mg/dL
Nitrite: NEGATIVE
PH: 7.5 (ref 5.0–8.0)
Protein, ur: 30 mg/dL — AB
Specific Gravity, Urine: 1.016 (ref 1.005–1.030)
Urobilinogen, UA: 0.2 mg/dL (ref 0.0–1.0)

## 2015-03-01 LAB — URINE MICROSCOPIC-ADD ON

## 2015-03-01 MED ORDER — KETOROLAC TROMETHAMINE 30 MG/ML IJ SOLN
30.0000 mg | Freq: Once | INTRAMUSCULAR | Status: AC
  Administered 2015-03-01: 30 mg via INTRAVENOUS
  Filled 2015-03-01: qty 1

## 2015-03-01 MED ORDER — CEPHALEXIN 500 MG PO CAPS: 500.0000 mg | ORAL_CAPSULE | Freq: Three times a day (TID) | ORAL | Status: DC

## 2015-03-01 NOTE — Discharge Instructions (Signed)
Catheter-Associated Urinary Tract Infection FAQs  What is "catheter-associated urinary tract infection"?  A urinary tract infection (also called "UTI") is an infection in the urinary system, which includes the bladder (which stores the urine) and the kidneys (which filter the blood to make urine). Germs (for example, bacteria or yeasts) do not normally live in these areas; but if germs are introduced, an infection can occur.  If you have a urinary catheter, germs can travel along the catheter and cause an infection in your bladder or your kidney; in that case it is called a catheter-associated urinary tract infection (or "CA-UTI").   What is a urinary catheter?  A urinary catheter is a thin tube placed in the bladder to drain urine. Urine drains through the tube into a bag that collects the urine. A urinary catheter may be used:  · If you are not able to urinate on your own  · To measure the amount of urine that you make, for example, during intensive care  · During and after some types of surgery  · During some tests of the kidneys and bladder  People with urinary catheters have a much higher chance of getting a urinary tract infection than people who don't have a catheter.  How do I get a catheter-associated urinary tract infection (CA-UTI)?  If germs enter the urinary tract, they may cause an infection. Many of the germs that cause a catheter-associated urinary tract infection are common germs found in your intestines that do not usually cause an infection there. Germs can enter the urinary tract when the catheter is being put in or while the catheter remains in the bladder.   What are the symptoms of a urinary tract infection?  Some of the common symptoms of a urinary tract infection are:  · Burning or pain in the lower abdomen (that is, below the stomach)  · Fever  · Bloody urine may be a sign of infection, but is also caused by other problems  · Burning during urination or an increase in the frequency of  urination after the catheter is removed.  Sometimes people with catheter-associated urinary tract infections do not have these symptoms of infection.  Can catheter-associated urinary tract infections be treated?  Yes, most catheter-associated urinary tract infections can be treated with antibiotics and removal or change of the catheter. Your doctor will determine which antibiotic is best for you.   What are some of the things that hospitals are doing to prevent catheter-associated urinary tract infections?  To prevent urinary tract infections, doctors and nurses take the following actions.   Catheter insertion  · External catheters in men (these look like condoms and are placed over the penis rather than into the penis)  · Putting a temporary catheter in to drain the urine and removing it right away. This is called intermittent urethral catheterization.  Catheter care  What can I do to help prevent catheter-associated urinary tract infections if I have a catheter?  · Always clean your hands before and after doing catheter care.  · Always keep your urine bag below the level of your bladder.  · Do not tug or pull on the tubing.  · Do not twist or kink the catheter tubing.  · Ask your healthcare provider each day if you still need the catheter.  What do I need to do when I go home from the hospital?  · If you will be going home with a catheter, your doctor or nurse should explain everything   tract infection, such as burning or pain in the lower abdomen, fever, or an increase in the frequency of urination, contact your doctor or nurse immediately. °· Before you go home, make sure you know who to contact if you have questions or problems after you get home. °If you have questions, please ask your doctor or nurse. °Developed and co-sponsored by The  Society for Healthcare Epidemiology of America (SHEA); Infectious Diseases Society of America (IDSA); American Hospital Association; Association for Professionals in Infection Control and Epidemiology (APIC); Centers for Disease Control and Prevention (CDC); and The Joint Commission. °  °This information is not intended to replace advice given to you by your health care provider. Make sure you discuss any questions you have with your health care provider. °  °Document Released: 12/28/2011 Document Revised: 08/19/2014 Document Reviewed: 06/18/2014 °Elsevier Interactive Patient Education ©2016 Elsevier Inc. ° °Urinary Tract Infection °Urinary tract infections (UTIs) can develop anywhere along your urinary tract. Your urinary tract is your body's drainage system for removing wastes and extra water. Your urinary tract includes two kidneys, two ureters, a bladder, and a urethra. Your kidneys are a pair of bean-shaped organs. Each kidney is about the size of your fist. They are located below your ribs, one on each side of your spine. °CAUSES °Infections are caused by microbes, which are microscopic organisms, including fungi, viruses, and bacteria. These organisms are so small that they can only be seen through a microscope. Bacteria are the microbes that most commonly cause UTIs. °SYMPTOMS  °Symptoms of UTIs may vary by age and gender of the patient and by the location of the infection. Symptoms in young women typically include a frequent and intense urge to urinate and a painful, burning feeling in the bladder or urethra during urination. Older women and men are more likely to be tired, shaky, and weak and have muscle aches and abdominal pain. A fever may mean the infection is in your kidneys. Other symptoms of a kidney infection include pain in your back or sides below the ribs, nausea, and vomiting. °DIAGNOSIS °To diagnose a UTI, your caregiver will ask you about your symptoms. Your caregiver will also ask you to  provide a urine sample. The urine sample will be tested for bacteria and white blood cells. White blood cells are made by your body to help fight infection. °TREATMENT  °Typically, UTIs can be treated with medication. Because most UTIs are caused by a bacterial infection, they usually can be treated with the use of antibiotics. The choice of antibiotic and length of treatment depend on your symptoms and the type of bacteria causing your infection. °HOME CARE INSTRUCTIONS °· If you were prescribed antibiotics, take them exactly as your caregiver instructs you. Finish the medication even if you feel better after you have only taken some of the medication. °· Drink enough water and fluids to keep your urine clear or pale yellow. °· Avoid caffeine, tea, and carbonated beverages. They tend to irritate your bladder. °· Empty your bladder often. Avoid holding urine for long periods of time. °· Empty your bladder before and after sexual intercourse. °· After a bowel movement, women should cleanse from front to back. Use each tissue only once. °SEEK MEDICAL CARE IF:  °· You have back pain. °· You develop a fever. °· Your symptoms do not begin to resolve within 3 days. °SEEK IMMEDIATE MEDICAL CARE IF:  °· You have severe back pain or lower abdominal pain. °· You develop chills. °· You have   nausea or vomiting.  You have continued burning or discomfort with urination. MAKE SURE YOU:   Understand these instructions.  Will watch your condition.  Will get help right away if you are not doing well or get worse.   This information is not intended to replace advice given to you by your health care provider. Make sure you discuss any questions you have with your health care provider.  Follow up with your urologist as soon as possible for reevaluation. Take anabiotic as prescribed. Return to the emergency department if you expands worsening of her symptoms, fever, abdominal distention or pain, vomiting, inability to  urinate.

## 2015-03-02 LAB — URINE CULTURE: SPECIAL REQUESTS: NORMAL

## 2015-04-28 DIAGNOSIS — C61 Malignant neoplasm of prostate: Secondary | ICD-10-CM | POA: Diagnosis not present

## 2015-04-28 DIAGNOSIS — Z87440 Personal history of urinary (tract) infections: Secondary | ICD-10-CM | POA: Diagnosis not present

## 2015-04-28 DIAGNOSIS — Z466 Encounter for fitting and adjustment of urinary device: Secondary | ICD-10-CM | POA: Diagnosis not present

## 2015-04-28 DIAGNOSIS — N401 Enlarged prostate with lower urinary tract symptoms: Secondary | ICD-10-CM | POA: Diagnosis not present

## 2015-04-28 DIAGNOSIS — R338 Other retention of urine: Secondary | ICD-10-CM | POA: Diagnosis not present

## 2015-05-05 DIAGNOSIS — R338 Other retention of urine: Secondary | ICD-10-CM | POA: Diagnosis not present

## 2015-05-05 DIAGNOSIS — Z466 Encounter for fitting and adjustment of urinary device: Secondary | ICD-10-CM | POA: Diagnosis not present

## 2015-05-05 DIAGNOSIS — N401 Enlarged prostate with lower urinary tract symptoms: Secondary | ICD-10-CM | POA: Diagnosis not present

## 2015-05-05 DIAGNOSIS — Z87440 Personal history of urinary (tract) infections: Secondary | ICD-10-CM | POA: Diagnosis not present

## 2015-05-05 DIAGNOSIS — C61 Malignant neoplasm of prostate: Secondary | ICD-10-CM | POA: Diagnosis not present

## 2015-05-12 DIAGNOSIS — C61 Malignant neoplasm of prostate: Secondary | ICD-10-CM | POA: Diagnosis not present

## 2015-05-12 DIAGNOSIS — Z87440 Personal history of urinary (tract) infections: Secondary | ICD-10-CM | POA: Diagnosis not present

## 2015-05-12 DIAGNOSIS — R338 Other retention of urine: Secondary | ICD-10-CM | POA: Diagnosis not present

## 2015-05-12 DIAGNOSIS — N401 Enlarged prostate with lower urinary tract symptoms: Secondary | ICD-10-CM | POA: Diagnosis not present

## 2015-05-12 DIAGNOSIS — Z466 Encounter for fitting and adjustment of urinary device: Secondary | ICD-10-CM | POA: Diagnosis not present

## 2015-05-18 DIAGNOSIS — R338 Other retention of urine: Secondary | ICD-10-CM | POA: Diagnosis not present

## 2015-05-18 DIAGNOSIS — C61 Malignant neoplasm of prostate: Secondary | ICD-10-CM | POA: Diagnosis not present

## 2015-05-19 DIAGNOSIS — C61 Malignant neoplasm of prostate: Secondary | ICD-10-CM | POA: Diagnosis not present

## 2015-05-19 DIAGNOSIS — N401 Enlarged prostate with lower urinary tract symptoms: Secondary | ICD-10-CM | POA: Diagnosis not present

## 2015-05-19 DIAGNOSIS — Z466 Encounter for fitting and adjustment of urinary device: Secondary | ICD-10-CM | POA: Diagnosis not present

## 2015-05-19 DIAGNOSIS — Z87440 Personal history of urinary (tract) infections: Secondary | ICD-10-CM | POA: Diagnosis not present

## 2015-05-19 DIAGNOSIS — R338 Other retention of urine: Secondary | ICD-10-CM | POA: Diagnosis not present

## 2015-05-26 DIAGNOSIS — R338 Other retention of urine: Secondary | ICD-10-CM | POA: Diagnosis not present

## 2015-05-26 DIAGNOSIS — C61 Malignant neoplasm of prostate: Secondary | ICD-10-CM | POA: Diagnosis not present

## 2015-05-26 DIAGNOSIS — Z466 Encounter for fitting and adjustment of urinary device: Secondary | ICD-10-CM | POA: Diagnosis not present

## 2015-05-26 DIAGNOSIS — Z87440 Personal history of urinary (tract) infections: Secondary | ICD-10-CM | POA: Diagnosis not present

## 2015-05-26 DIAGNOSIS — N401 Enlarged prostate with lower urinary tract symptoms: Secondary | ICD-10-CM | POA: Diagnosis not present

## 2015-06-02 DIAGNOSIS — C61 Malignant neoplasm of prostate: Secondary | ICD-10-CM | POA: Diagnosis not present

## 2015-06-02 DIAGNOSIS — R338 Other retention of urine: Secondary | ICD-10-CM | POA: Diagnosis not present

## 2015-06-02 DIAGNOSIS — N401 Enlarged prostate with lower urinary tract symptoms: Secondary | ICD-10-CM | POA: Diagnosis not present

## 2015-06-02 DIAGNOSIS — Z466 Encounter for fitting and adjustment of urinary device: Secondary | ICD-10-CM | POA: Diagnosis not present

## 2015-06-02 DIAGNOSIS — Z87440 Personal history of urinary (tract) infections: Secondary | ICD-10-CM | POA: Diagnosis not present

## 2015-06-06 DIAGNOSIS — Z466 Encounter for fitting and adjustment of urinary device: Secondary | ICD-10-CM | POA: Diagnosis not present

## 2015-06-06 DIAGNOSIS — C61 Malignant neoplasm of prostate: Secondary | ICD-10-CM | POA: Diagnosis not present

## 2015-06-06 DIAGNOSIS — N401 Enlarged prostate with lower urinary tract symptoms: Secondary | ICD-10-CM | POA: Diagnosis not present

## 2015-06-06 DIAGNOSIS — Z87440 Personal history of urinary (tract) infections: Secondary | ICD-10-CM | POA: Diagnosis not present

## 2015-06-06 DIAGNOSIS — R338 Other retention of urine: Secondary | ICD-10-CM | POA: Diagnosis not present

## 2015-06-16 DIAGNOSIS — C61 Malignant neoplasm of prostate: Secondary | ICD-10-CM | POA: Diagnosis not present

## 2015-06-16 DIAGNOSIS — Z466 Encounter for fitting and adjustment of urinary device: Secondary | ICD-10-CM | POA: Diagnosis not present

## 2015-06-16 DIAGNOSIS — R338 Other retention of urine: Secondary | ICD-10-CM | POA: Diagnosis not present

## 2015-06-16 DIAGNOSIS — N401 Enlarged prostate with lower urinary tract symptoms: Secondary | ICD-10-CM | POA: Diagnosis not present

## 2015-06-16 DIAGNOSIS — Z87440 Personal history of urinary (tract) infections: Secondary | ICD-10-CM | POA: Diagnosis not present

## 2015-06-23 DIAGNOSIS — Z466 Encounter for fitting and adjustment of urinary device: Secondary | ICD-10-CM | POA: Diagnosis not present

## 2015-06-23 DIAGNOSIS — Z87440 Personal history of urinary (tract) infections: Secondary | ICD-10-CM | POA: Diagnosis not present

## 2015-06-23 DIAGNOSIS — C61 Malignant neoplasm of prostate: Secondary | ICD-10-CM | POA: Diagnosis not present

## 2015-06-23 DIAGNOSIS — N401 Enlarged prostate with lower urinary tract symptoms: Secondary | ICD-10-CM | POA: Diagnosis not present

## 2015-06-23 DIAGNOSIS — R338 Other retention of urine: Secondary | ICD-10-CM | POA: Diagnosis not present

## 2015-06-30 DIAGNOSIS — N401 Enlarged prostate with lower urinary tract symptoms: Secondary | ICD-10-CM | POA: Diagnosis not present

## 2015-06-30 DIAGNOSIS — Z466 Encounter for fitting and adjustment of urinary device: Secondary | ICD-10-CM | POA: Diagnosis not present

## 2015-06-30 DIAGNOSIS — Z87440 Personal history of urinary (tract) infections: Secondary | ICD-10-CM | POA: Diagnosis not present

## 2015-06-30 DIAGNOSIS — M4806 Spinal stenosis, lumbar region: Secondary | ICD-10-CM | POA: Diagnosis not present

## 2015-06-30 DIAGNOSIS — R338 Other retention of urine: Secondary | ICD-10-CM | POA: Diagnosis not present

## 2015-06-30 DIAGNOSIS — M5116 Intervertebral disc disorders with radiculopathy, lumbar region: Secondary | ICD-10-CM | POA: Diagnosis not present

## 2015-06-30 DIAGNOSIS — C61 Malignant neoplasm of prostate: Secondary | ICD-10-CM | POA: Diagnosis not present

## 2015-06-30 DIAGNOSIS — Z9181 History of falling: Secondary | ICD-10-CM | POA: Diagnosis not present

## 2015-07-07 DIAGNOSIS — N401 Enlarged prostate with lower urinary tract symptoms: Secondary | ICD-10-CM | POA: Diagnosis not present

## 2015-07-07 DIAGNOSIS — C61 Malignant neoplasm of prostate: Secondary | ICD-10-CM | POA: Diagnosis not present

## 2015-07-07 DIAGNOSIS — Z87440 Personal history of urinary (tract) infections: Secondary | ICD-10-CM | POA: Diagnosis not present

## 2015-07-07 DIAGNOSIS — M4806 Spinal stenosis, lumbar region: Secondary | ICD-10-CM | POA: Diagnosis not present

## 2015-07-07 DIAGNOSIS — Z9181 History of falling: Secondary | ICD-10-CM | POA: Diagnosis not present

## 2015-07-07 DIAGNOSIS — R338 Other retention of urine: Secondary | ICD-10-CM | POA: Diagnosis not present

## 2015-07-07 DIAGNOSIS — Z466 Encounter for fitting and adjustment of urinary device: Secondary | ICD-10-CM | POA: Diagnosis not present

## 2015-07-07 DIAGNOSIS — M5116 Intervertebral disc disorders with radiculopathy, lumbar region: Secondary | ICD-10-CM | POA: Diagnosis not present

## 2015-07-14 DIAGNOSIS — N401 Enlarged prostate with lower urinary tract symptoms: Secondary | ICD-10-CM | POA: Diagnosis not present

## 2015-07-14 DIAGNOSIS — R338 Other retention of urine: Secondary | ICD-10-CM | POA: Diagnosis not present

## 2015-07-14 DIAGNOSIS — M4806 Spinal stenosis, lumbar region: Secondary | ICD-10-CM | POA: Diagnosis not present

## 2015-07-14 DIAGNOSIS — C61 Malignant neoplasm of prostate: Secondary | ICD-10-CM | POA: Diagnosis not present

## 2015-07-14 DIAGNOSIS — Z466 Encounter for fitting and adjustment of urinary device: Secondary | ICD-10-CM | POA: Diagnosis not present

## 2015-07-14 DIAGNOSIS — Z87440 Personal history of urinary (tract) infections: Secondary | ICD-10-CM | POA: Diagnosis not present

## 2015-07-14 DIAGNOSIS — M5116 Intervertebral disc disorders with radiculopathy, lumbar region: Secondary | ICD-10-CM | POA: Diagnosis not present

## 2015-07-14 DIAGNOSIS — Z9181 History of falling: Secondary | ICD-10-CM | POA: Diagnosis not present

## 2015-07-21 DIAGNOSIS — M5116 Intervertebral disc disorders with radiculopathy, lumbar region: Secondary | ICD-10-CM | POA: Diagnosis not present

## 2015-07-21 DIAGNOSIS — N401 Enlarged prostate with lower urinary tract symptoms: Secondary | ICD-10-CM | POA: Diagnosis not present

## 2015-07-21 DIAGNOSIS — R338 Other retention of urine: Secondary | ICD-10-CM | POA: Diagnosis not present

## 2015-07-21 DIAGNOSIS — C61 Malignant neoplasm of prostate: Secondary | ICD-10-CM | POA: Diagnosis not present

## 2015-07-21 DIAGNOSIS — Z466 Encounter for fitting and adjustment of urinary device: Secondary | ICD-10-CM | POA: Diagnosis not present

## 2015-07-21 DIAGNOSIS — Z87440 Personal history of urinary (tract) infections: Secondary | ICD-10-CM | POA: Diagnosis not present

## 2015-07-21 DIAGNOSIS — Z9181 History of falling: Secondary | ICD-10-CM | POA: Diagnosis not present

## 2015-07-21 DIAGNOSIS — M4806 Spinal stenosis, lumbar region: Secondary | ICD-10-CM | POA: Diagnosis not present

## 2015-07-28 DIAGNOSIS — M5116 Intervertebral disc disorders with radiculopathy, lumbar region: Secondary | ICD-10-CM | POA: Diagnosis not present

## 2015-07-28 DIAGNOSIS — N401 Enlarged prostate with lower urinary tract symptoms: Secondary | ICD-10-CM | POA: Diagnosis not present

## 2015-07-28 DIAGNOSIS — Z466 Encounter for fitting and adjustment of urinary device: Secondary | ICD-10-CM | POA: Diagnosis not present

## 2015-07-28 DIAGNOSIS — Z9181 History of falling: Secondary | ICD-10-CM | POA: Diagnosis not present

## 2015-07-28 DIAGNOSIS — C61 Malignant neoplasm of prostate: Secondary | ICD-10-CM | POA: Diagnosis not present

## 2015-07-28 DIAGNOSIS — Z87440 Personal history of urinary (tract) infections: Secondary | ICD-10-CM | POA: Diagnosis not present

## 2015-07-28 DIAGNOSIS — M4806 Spinal stenosis, lumbar region: Secondary | ICD-10-CM | POA: Diagnosis not present

## 2015-07-28 DIAGNOSIS — R338 Other retention of urine: Secondary | ICD-10-CM | POA: Diagnosis not present

## 2015-08-04 DIAGNOSIS — Z87440 Personal history of urinary (tract) infections: Secondary | ICD-10-CM | POA: Diagnosis not present

## 2015-08-04 DIAGNOSIS — M5116 Intervertebral disc disorders with radiculopathy, lumbar region: Secondary | ICD-10-CM | POA: Diagnosis not present

## 2015-08-04 DIAGNOSIS — C61 Malignant neoplasm of prostate: Secondary | ICD-10-CM | POA: Diagnosis not present

## 2015-08-04 DIAGNOSIS — Z9181 History of falling: Secondary | ICD-10-CM | POA: Diagnosis not present

## 2015-08-04 DIAGNOSIS — Z466 Encounter for fitting and adjustment of urinary device: Secondary | ICD-10-CM | POA: Diagnosis not present

## 2015-08-04 DIAGNOSIS — N401 Enlarged prostate with lower urinary tract symptoms: Secondary | ICD-10-CM | POA: Diagnosis not present

## 2015-08-04 DIAGNOSIS — R338 Other retention of urine: Secondary | ICD-10-CM | POA: Diagnosis not present

## 2015-08-04 DIAGNOSIS — M4806 Spinal stenosis, lumbar region: Secondary | ICD-10-CM | POA: Diagnosis not present

## 2015-08-11 DIAGNOSIS — M5116 Intervertebral disc disorders with radiculopathy, lumbar region: Secondary | ICD-10-CM | POA: Diagnosis not present

## 2015-08-11 DIAGNOSIS — M4806 Spinal stenosis, lumbar region: Secondary | ICD-10-CM | POA: Diagnosis not present

## 2015-08-11 DIAGNOSIS — N401 Enlarged prostate with lower urinary tract symptoms: Secondary | ICD-10-CM | POA: Diagnosis not present

## 2015-08-11 DIAGNOSIS — R338 Other retention of urine: Secondary | ICD-10-CM | POA: Diagnosis not present

## 2015-08-11 DIAGNOSIS — Z87440 Personal history of urinary (tract) infections: Secondary | ICD-10-CM | POA: Diagnosis not present

## 2015-08-11 DIAGNOSIS — C61 Malignant neoplasm of prostate: Secondary | ICD-10-CM | POA: Diagnosis not present

## 2015-08-11 DIAGNOSIS — Z9181 History of falling: Secondary | ICD-10-CM | POA: Diagnosis not present

## 2015-08-11 DIAGNOSIS — Z466 Encounter for fitting and adjustment of urinary device: Secondary | ICD-10-CM | POA: Diagnosis not present

## 2015-08-18 DIAGNOSIS — R338 Other retention of urine: Secondary | ICD-10-CM | POA: Diagnosis not present

## 2015-08-18 DIAGNOSIS — C61 Malignant neoplasm of prostate: Secondary | ICD-10-CM | POA: Diagnosis not present

## 2015-08-18 DIAGNOSIS — Z466 Encounter for fitting and adjustment of urinary device: Secondary | ICD-10-CM | POA: Diagnosis not present

## 2015-08-18 DIAGNOSIS — Z9181 History of falling: Secondary | ICD-10-CM | POA: Diagnosis not present

## 2015-08-18 DIAGNOSIS — N401 Enlarged prostate with lower urinary tract symptoms: Secondary | ICD-10-CM | POA: Diagnosis not present

## 2015-08-18 DIAGNOSIS — Z87440 Personal history of urinary (tract) infections: Secondary | ICD-10-CM | POA: Diagnosis not present

## 2015-08-18 DIAGNOSIS — M4806 Spinal stenosis, lumbar region: Secondary | ICD-10-CM | POA: Diagnosis not present

## 2015-08-18 DIAGNOSIS — M5116 Intervertebral disc disorders with radiculopathy, lumbar region: Secondary | ICD-10-CM | POA: Diagnosis not present

## 2015-08-24 DIAGNOSIS — M4806 Spinal stenosis, lumbar region: Secondary | ICD-10-CM | POA: Diagnosis not present

## 2015-08-24 DIAGNOSIS — Z466 Encounter for fitting and adjustment of urinary device: Secondary | ICD-10-CM | POA: Diagnosis not present

## 2015-08-24 DIAGNOSIS — Z9181 History of falling: Secondary | ICD-10-CM | POA: Diagnosis not present

## 2015-08-24 DIAGNOSIS — M5116 Intervertebral disc disorders with radiculopathy, lumbar region: Secondary | ICD-10-CM | POA: Diagnosis not present

## 2015-08-24 DIAGNOSIS — N401 Enlarged prostate with lower urinary tract symptoms: Secondary | ICD-10-CM | POA: Diagnosis not present

## 2015-08-24 DIAGNOSIS — R338 Other retention of urine: Secondary | ICD-10-CM | POA: Diagnosis not present

## 2015-08-24 DIAGNOSIS — C61 Malignant neoplasm of prostate: Secondary | ICD-10-CM | POA: Diagnosis not present

## 2015-08-24 DIAGNOSIS — Z87440 Personal history of urinary (tract) infections: Secondary | ICD-10-CM | POA: Diagnosis not present

## 2015-09-04 DIAGNOSIS — R338 Other retention of urine: Secondary | ICD-10-CM | POA: Diagnosis not present

## 2015-09-04 DIAGNOSIS — Z87440 Personal history of urinary (tract) infections: Secondary | ICD-10-CM | POA: Diagnosis not present

## 2015-09-04 DIAGNOSIS — C61 Malignant neoplasm of prostate: Secondary | ICD-10-CM | POA: Diagnosis not present

## 2015-09-04 DIAGNOSIS — M5116 Intervertebral disc disorders with radiculopathy, lumbar region: Secondary | ICD-10-CM | POA: Diagnosis not present

## 2015-09-04 DIAGNOSIS — N401 Enlarged prostate with lower urinary tract symptoms: Secondary | ICD-10-CM | POA: Diagnosis not present

## 2015-09-04 DIAGNOSIS — M4806 Spinal stenosis, lumbar region: Secondary | ICD-10-CM | POA: Diagnosis not present

## 2015-09-04 DIAGNOSIS — Z9181 History of falling: Secondary | ICD-10-CM | POA: Diagnosis not present

## 2015-09-04 DIAGNOSIS — Z466 Encounter for fitting and adjustment of urinary device: Secondary | ICD-10-CM | POA: Diagnosis not present

## 2015-09-11 DIAGNOSIS — Z87440 Personal history of urinary (tract) infections: Secondary | ICD-10-CM | POA: Diagnosis not present

## 2015-09-11 DIAGNOSIS — M4806 Spinal stenosis, lumbar region: Secondary | ICD-10-CM | POA: Diagnosis not present

## 2015-09-11 DIAGNOSIS — Z466 Encounter for fitting and adjustment of urinary device: Secondary | ICD-10-CM | POA: Diagnosis not present

## 2015-09-11 DIAGNOSIS — C61 Malignant neoplasm of prostate: Secondary | ICD-10-CM | POA: Diagnosis not present

## 2015-09-11 DIAGNOSIS — R338 Other retention of urine: Secondary | ICD-10-CM | POA: Diagnosis not present

## 2015-09-11 DIAGNOSIS — Z9181 History of falling: Secondary | ICD-10-CM | POA: Diagnosis not present

## 2015-09-11 DIAGNOSIS — N401 Enlarged prostate with lower urinary tract symptoms: Secondary | ICD-10-CM | POA: Diagnosis not present

## 2015-09-11 DIAGNOSIS — M5116 Intervertebral disc disorders with radiculopathy, lumbar region: Secondary | ICD-10-CM | POA: Diagnosis not present

## 2015-09-15 DIAGNOSIS — R338 Other retention of urine: Secondary | ICD-10-CM | POA: Diagnosis not present

## 2015-09-15 DIAGNOSIS — Z87440 Personal history of urinary (tract) infections: Secondary | ICD-10-CM | POA: Diagnosis not present

## 2015-09-15 DIAGNOSIS — M5116 Intervertebral disc disorders with radiculopathy, lumbar region: Secondary | ICD-10-CM | POA: Diagnosis not present

## 2015-09-15 DIAGNOSIS — M4806 Spinal stenosis, lumbar region: Secondary | ICD-10-CM | POA: Diagnosis not present

## 2015-09-15 DIAGNOSIS — C61 Malignant neoplasm of prostate: Secondary | ICD-10-CM | POA: Diagnosis not present

## 2015-09-15 DIAGNOSIS — Z9181 History of falling: Secondary | ICD-10-CM | POA: Diagnosis not present

## 2015-09-15 DIAGNOSIS — N401 Enlarged prostate with lower urinary tract symptoms: Secondary | ICD-10-CM | POA: Diagnosis not present

## 2015-09-15 DIAGNOSIS — Z466 Encounter for fitting and adjustment of urinary device: Secondary | ICD-10-CM | POA: Diagnosis not present

## 2016-08-06 ENCOUNTER — Emergency Department (HOSPITAL_COMMUNITY)
Admission: EM | Admit: 2016-08-06 | Discharge: 2016-08-06 | Disposition: A | Discharge status: Home / Self Care | Payer: Non-veteran care | Attending: Emergency Medicine | Admitting: Emergency Medicine

## 2016-08-06 ENCOUNTER — Encounter (HOSPITAL_COMMUNITY): Payer: Self-pay | Admitting: Oncology

## 2016-08-06 DIAGNOSIS — Z79899 Other long term (current) drug therapy: Secondary | ICD-10-CM | POA: Insufficient documentation

## 2016-08-06 DIAGNOSIS — N3001 Acute cystitis with hematuria: Secondary | ICD-10-CM

## 2016-08-06 DIAGNOSIS — Y733 Surgical instruments, materials and gastroenterology and urology devices (including sutures) associated with adverse incidents: Secondary | ICD-10-CM | POA: Insufficient documentation

## 2016-08-06 DIAGNOSIS — T83098A Other mechanical complication of other indwelling urethral catheter, initial encounter: Secondary | ICD-10-CM | POA: Diagnosis not present

## 2016-08-06 DIAGNOSIS — T839XXA Unspecified complication of genitourinary prosthetic device, implant and graft, initial encounter: Secondary | ICD-10-CM

## 2016-08-06 LAB — URINALYSIS, ROUTINE W REFLEX MICROSCOPIC
Bilirubin Urine: NEGATIVE
Glucose, UA: NEGATIVE mg/dL
KETONES UR: NEGATIVE mg/dL
Nitrite: NEGATIVE
PH: 5 (ref 5.0–8.0)
PROTEIN: 100 mg/dL — AB
Specific Gravity, Urine: 1.012 (ref 1.005–1.030)
Squamous Epithelial / LPF: NONE SEEN

## 2016-08-06 LAB — I-STAT CHEM 8, ED
BUN: 34 mg/dL — ABNORMAL HIGH (ref 6–20)
Calcium, Ion: 1.16 mmol/L (ref 1.15–1.40)
Chloride: 103 mmol/L (ref 101–111)
Creatinine, Ser: 1.7 mg/dL — ABNORMAL HIGH (ref 0.61–1.24)
GLUCOSE: 130 mg/dL — AB (ref 65–99)
HCT: 26 % — ABNORMAL LOW (ref 39.0–52.0)
Hemoglobin: 8.8 g/dL — ABNORMAL LOW (ref 13.0–17.0)
POTASSIUM: 4.5 mmol/L (ref 3.5–5.1)
Sodium: 136 mmol/L (ref 135–145)
TCO2: 24 mmol/L (ref 0–100)

## 2016-08-06 MED ORDER — LIDOCAINE HCL 2 % EX GEL
1.0000 "application " | Freq: Once | CUTANEOUS | Status: AC
  Administered 2016-08-06: 1 via URETHRAL
  Filled 2016-08-06: qty 11

## 2016-08-06 MED ORDER — TAMSULOSIN HCL 0.4 MG PO CAPS
0.4000 mg | ORAL_CAPSULE | Freq: Every day | ORAL | Status: DC
  Administered 2016-08-06: 0.4 mg via ORAL
  Filled 2016-08-06: qty 1

## 2016-08-06 MED ORDER — CEPHALEXIN 500 MG PO CAPS: 500.0000 mg | ORAL_CAPSULE | Freq: Four times a day (QID) | ORAL | 0 refills | Status: DC

## 2016-08-06 MED ORDER — LIDOCAINE HCL 1 % IJ SOLN
INTRAMUSCULAR | Status: AC
  Filled 2016-08-06: qty 20

## 2016-08-06 MED ORDER — CEFTRIAXONE SODIUM 1 G IJ SOLR
1.0000 g | Freq: Once | INTRAMUSCULAR | Status: AC
  Administered 2016-08-06: 1 g via INTRAMUSCULAR
  Filled 2016-08-06: qty 10

## 2016-08-06 MED ORDER — TAMSULOSIN HCL 0.4 MG PO CAPS: 0.4000 mg | ORAL_CAPSULE | Freq: Every day | ORAL | 0 refills | Status: DC

## 2016-08-06 NOTE — ED Provider Notes (Signed)
[facility] DEPT Provider Note   CSN: [id number] Arrival date & time: 08/06/16  0330     History   Chief Complaint Chief Complaint  Patient presents with  . Foley Clogged    HPI Anthony Nicholson is a 83 y.o. male.  The history is provided by the patient.  Dysuria   This is a recurrent problem. The current episode started 12 to 24 hours ago. The problem occurs intermittently. The problem has not changed since onset.The quality of the pain is described as aching. The pain is moderate. There has been no fever. He is not sexually active. There is no history of pyelonephritis. Pertinent negatives include no sweats and no flank pain. Associated symptoms comments: Inability to urinate. He has tried nothing for the symptoms. His past medical history is significant for catheterization. His past medical history does not include kidney stones.    Past Medical History:  Diagnosis Date  . Basal cell carcinoma   . HOH (hard of hearing)   . Laceration of arm 02/04/2013    Patient Active Problem List   Diagnosis Date Noted  . Elevated troponin I level 12/12/2014  . Demand ischemia ([place])   . Sepsis ([place]) 12/09/2014  . UTI (lower urinary tract infection) 12/09/2014  . Adenocarcinoma of prostate ([place]) 10/03/2014  . Acute renal failure syndrome ([place])   . Elevated PSA 10/01/2014  . Nodular prostate 10/01/2014  . Urinary retention   . Acute urinary retention 09/29/2014  . Acute renal failure ([place]) 09/29/2014  . Hyperkalemia 09/29/2014  . Anemia of chronic disease 09/29/2014  . Pain in lower limb 08/09/2013  . Onychomycosis 11/09/2012  . Pain in joint, ankle and foot 11/09/2012    Past Surgical History:  Procedure Laterality Date  .  sharpnel removal    1943    . CYSTOSCOPY N/A 10/02/2014   Procedure: FLEXIBLE CYSTOSCOPY;  Surgeon: Irine Seal, MD;  Location: [facility];  Service: Urology;  Laterality: N/A;  . I&D EXTREMITY Left 02/04/2013   Procedure: IRRIGATION AND DEBRIDEMENT Left Arm  Laceration with Repair as Necessary;  Surgeon: Wylene Simmer, MD;  Location: [facility];  Service: Orthopedics;  Laterality: Left;  . INCISION AND DRAINAGE Left 02/04/2013   left arm laceration    Dr Doran Durand  . PROSTATE BIOPSY N/A 10/02/2014   Procedure: PROSTATE ULTRASOUND AND BIOPSY ;  Surgeon: Irine Seal, MD;  Location: [facility];  Service: Urology;  Laterality: N/A;       Home Medications    Prior to Admission medications   Medication Sig Start Date End Date Taking? Authorizing Provider  amoxicillin (AMOXIL) 500 MG capsule Take 1 capsule (500 mg total) by mouth every 8 (eight) hours. Patient not taking: Reported on 01/16/2015 12/13/14   Debbe Odea, MD  cephALEXin (KEFLEX) 500 MG capsule Take 1 capsule (500 mg total) by mouth 4 (four) times daily. 08/06/16   Stephany Poorman, MD  degarelix [facility] 80 MG injection Inject 80 mg into the skin every 28 (twenty-eight) days.    Historical Provider, MD  lidocaine (XYLOCAINE) 2 % jelly Place 1 application into the urethra once.    Historical Provider, MD  Miconazole Nitrate 2 % AERO Apply 1 application topically daily as needed (athlete's foot).    Historical Provider, MD  nystatin cream (MYCOSTATIN) Apply 1 application topically 2 (two) times daily as needed (sores). To sores on bottom.    Historical Provider, MD  polyethylene glycol (MIRALAX / GLYCOLAX) packet Take 17 g by mouth daily as needed for mild  constipation. Hold if develops diarrhea Patient not taking: Reported on 01/16/2015 12/13/14   Debbe Odea, MD  senna-docusate (SENOKOT-S) 8.6-50 MG per tablet Take 1 tablet by mouth at bedtime as needed for mild constipation. Patient not taking: Reported on 01/16/2015 12/13/14   Debbe Odea, MD  tamsulosin (FLOMAX) 0.4 MG CAPS capsule Take 1 capsule (0.4 mg total) by mouth daily. 08/06/16   Veatrice Kells, MD    Family History Family History  Problem Relation Age of Onset  . Glaucoma Mother   . Stroke Father   . Prostate cancer Neg Hx     Social  History Social History  Substance Use Topics  . Smoking status: Never Smoker  . Smokeless tobacco: Never Used  . Alcohol use No     Allergies   Patient has no known allergies.   Review of Systems Review of Systems  Constitutional: Negative for fever.  Respiratory: Negative for shortness of breath.   Cardiovascular: Negative for chest pain.  Genitourinary: Positive for difficulty urinating and dysuria. Negative for flank pain.  All other systems reviewed and are negative.    Physical Exam Updated Vital Signs BP (!) 134/56   Pulse 90   Temp 97.7 F (36.5 C) (Oral)   Resp 16   SpO2 98%   Physical Exam  Constitutional: He is oriented to person, place, and time. He appears well-developed and well-nourished. No distress.  HENT:  Head: Normocephalic and atraumatic.  Mouth/Throat: No oropharyngeal exudate.  Eyes: Conjunctivae and EOM are normal. Pupils are equal, round, and reactive to light.  Neck: Normal range of motion. Neck supple.  Cardiovascular: Normal rate, regular rhythm, normal heart sounds and intact distal pulses.   Pulmonary/Chest: Effort normal and breath sounds normal. He has no wheezes. He has no rales.  Abdominal: Soft. Bowel sounds are normal. He exhibits no mass. There is no tenderness. There is no rebound and no guarding.  Musculoskeletal: Normal range of motion.  Neurological: He is alert and oriented to person, place, and time.  Skin: Skin is warm and dry. Capillary refill takes less than 2 seconds.  Psychiatric: He has a normal mood and affect.     ED Treatments / Results   Vitals:   08/06/16 0340 08/06/16 0628  BP: (!) 144/73 (!) 134/56  Pulse: (!) 123 90  Resp: 20 16  Temp: 97.6 F (36.4 C) 97.7 F (36.5 C)    Labs (all labs ordered are listed, but only abnormal results are displayed)  Results for orders placed or performed during the hospital encounter of 08/06/16  Urinalysis, Routine w reflex microscopic  Result Value Ref Range    Color, Urine YELLOW YELLOW   APPearance HAZY (A) CLEAR   Specific Gravity, Urine 1.012 1.005 - 1.030   pH 5.0 5.0 - 8.0   Glucose, UA NEGATIVE NEGATIVE mg/dL   Hgb urine dipstick LARGE (A) NEGATIVE   Bilirubin Urine NEGATIVE NEGATIVE   Ketones, ur NEGATIVE NEGATIVE mg/dL   Protein, ur 100 (A) NEGATIVE mg/dL   Nitrite NEGATIVE NEGATIVE   Leukocytes, UA LARGE (A) NEGATIVE   RBC / HPF TOO NUMEROUS TO COUNT 0 - 5 RBC/hpf   WBC, UA TOO NUMEROUS TO COUNT 0 - 5 WBC/hpf   Bacteria, UA RARE (A) NONE SEEN   Squamous Epithelial / LPF NONE SEEN NONE SEEN   WBC Clumps PRESENT    Hyaline Casts, UA PRESENT   I-Stat Chem 8, ED  Result Value Ref Range   Sodium 136 135 - 145 mmol/L  Potassium 4.5 3.5 - 5.1 mmol/L   Chloride 103 101 - 111 mmol/L   BUN 34 (H) 6 - 20 mg/dL   Creatinine, Ser 1.70 (H) 0.61 - 1.24 mg/dL   Glucose, Bld 130 (H) 65 - 99 mg/dL   Calcium, Ion 1.16 1.15 - 1.40 mmol/L   TCO2 24 0 - 100 mmol/L   Hemoglobin 8.8 (L) 13.0 - 17.0 g/dL   HCT 26.0 (L) 39.0 - 52.0 %   No results found.   Procedures Procedures (including critical care time)  Medications Ordered in ED Medications  tamsulosin (FLOMAX) capsule 0.4 mg (0.4 mg Oral Given 08/06/16 0621)  lidocaine (XYLOCAINE) 1 % (with pres) injection (not administered)  lidocaine (XYLOCAINE) 2 % jelly 1 application (1 application Urethral Given 08/06/16 0533)  cefTRIAXone (ROCEPHIN) injection 1 g (1 g Intramuscular Given 08/06/16 0622)      Final Clinical Impressions(s) / ED Diagnoses   Final diagnoses:  Problem with Foley catheter, initial encounter ([place])  Acute cystitis with hematuria   Follow up with your PMD in 2 days and your urologist in one week.  Exam and vitals are benign and reassuring.  Return for fevers, intractable bleeding, weakness, inability to pass urine,  intractable vomiting or pain or any concerns. You will need a recheck of your creatinine in 3 days.   After history, exam, and medical workup I feel the  patient has been appropriately medically screened and is safe for discharge home. Pertinent diagnoses were discussed with the patient. Patient was given return precautions.   New Prescriptions New Prescriptions   CEPHALEXIN (KEFLEX) 500 MG CAPSULE    Take 1 capsule (500 mg total) by mouth 4 (four) times daily.   TAMSULOSIN (FLOMAX) 0.4 MG CAPS CAPSULE    Take 1 capsule (0.4 mg total) by mouth daily.     Veatrice Kells, MD 08/06/16 [phone]

## 2016-08-06 NOTE — ED Triage Notes (Signed)
Pt has a chronic indwelling catheter that was changed yesterday afternoon.  Since that time 2 additional RN's have attempted to unclog foley w/o success.  Per pt's family unless his usual RN is there they generally have difficulty w/ successful foley insertion.

## 2017-03-27 ENCOUNTER — Emergency Department (HOSPITAL_COMMUNITY)
Admission: EM | Admit: 2017-03-27 | Discharge: 2017-03-28 | Disposition: A | Discharge status: Home / Self Care | Payer: PPO | Attending: Emergency Medicine | Admitting: Emergency Medicine

## 2017-03-27 ENCOUNTER — Encounter (HOSPITAL_COMMUNITY): Payer: Self-pay | Admitting: Emergency Medicine

## 2017-03-27 ENCOUNTER — Other Ambulatory Visit: Payer: Self-pay

## 2017-03-27 DIAGNOSIS — Z79899 Other long term (current) drug therapy: Secondary | ICD-10-CM | POA: Diagnosis not present

## 2017-03-27 DIAGNOSIS — R339 Retention of urine, unspecified: Secondary | ICD-10-CM | POA: Diagnosis present

## 2017-03-27 DIAGNOSIS — N390 Urinary tract infection, site not specified: Secondary | ICD-10-CM | POA: Diagnosis not present

## 2017-03-27 LAB — I-STAT CHEM 8, ED
BUN: 29 mg/dL — ABNORMAL HIGH (ref 6–20)
CALCIUM ION: 1.16 mmol/L (ref 1.15–1.40)
CREATININE: 1.4 mg/dL — AB (ref 0.61–1.24)
Chloride: 104 mmol/L (ref 101–111)
GLUCOSE: 111 mg/dL — AB (ref 65–99)
HCT: 33 % — ABNORMAL LOW (ref 39.0–52.0)
Hemoglobin: 11.2 g/dL — ABNORMAL LOW (ref 13.0–17.0)
Potassium: 4.6 mmol/L (ref 3.5–5.1)
Sodium: 142 mmol/L (ref 135–145)
TCO2: 28 mmol/L (ref 22–32)

## 2017-03-27 LAB — URINALYSIS, ROUTINE W REFLEX MICROSCOPIC
Bilirubin Urine: NEGATIVE
Glucose, UA: NEGATIVE mg/dL
KETONES UR: NEGATIVE mg/dL
Nitrite: NEGATIVE
PROTEIN: 100 mg/dL — AB
SQUAMOUS EPITHELIAL / LPF: NONE SEEN
Specific Gravity, Urine: 1.013 (ref 1.005–1.030)
pH: 6 (ref 5.0–8.0)

## 2017-03-27 MED ORDER — CEPHALEXIN 500 MG PO CAPS: 500.0000 mg | ORAL_CAPSULE | Freq: Four times a day (QID) | ORAL | 0 refills | Status: AC

## 2017-03-27 MED ORDER — CEPHALEXIN 500 MG PO CAPS
500.0000 mg | ORAL_CAPSULE | Freq: Once | ORAL | Status: AC
  Administered 2017-03-28: 500 mg via ORAL
  Filled 2017-03-27: qty 1

## 2017-03-27 MED ORDER — LIDOCAINE HCL 2 % EX GEL
1.0000 "application " | Freq: Once | CUTANEOUS | Status: AC
  Administered 2017-03-27: 1 via URETHRAL
  Filled 2017-03-27: qty 11

## 2017-03-27 NOTE — ED Notes (Signed)
Foley unable to be irrigated. Will change out.

## 2017-03-27 NOTE — ED Provider Notes (Signed)
[facility] DEPT Provider Note   CSN: [id number] Arrival date & time: 03/27/17  2107     History   Chief Complaint Chief Complaint  Patient presents with  . Urinary Retention    HPI Anthony Nicholson is a 83 y.o. male.  HPI  83 y.o. male, presents to the Emergency Department today due to urinary retention. Hx same. Recent foley change on Friday. Notes chronic foley use x4 years due to prostate cancer. Pt notes mild suprapubic pain since 1pm today. Unable to produce urine. No hematuria. No back pain. No fevers. No CP/SOB. No cough/congestion. No N/V. No other symptoms noted   Past Medical History:  Diagnosis Date  . Basal cell carcinoma   . HOH (hard of hearing)   . Laceration of arm 02/04/2013    Patient Active Problem List   Diagnosis Date Noted  . Elevated troponin I level 12/12/2014  . Demand ischemia ([place])   . Sepsis ([place]) 12/09/2014  . UTI (lower urinary tract infection) 12/09/2014  . Adenocarcinoma of prostate ([place]) 10/03/2014  . Acute renal failure syndrome ([place])   . Elevated PSA 10/01/2014  . Nodular prostate 10/01/2014  . Urinary retention   . Acute urinary retention 09/29/2014  . Acute renal failure ([place]) 09/29/2014  . Hyperkalemia 09/29/2014  . Anemia of chronic disease 09/29/2014  . Pain in lower limb 08/09/2013  . Onychomycosis 11/09/2012  . Pain in joint, ankle and foot 11/09/2012    Past Surgical History:  Procedure Laterality Date  .  sharpnel removal    1943    . CYSTOSCOPY N/A 10/02/2014   Procedure: FLEXIBLE CYSTOSCOPY;  Surgeon: Irine Seal, MD;  Location: [facility];  Service: Urology;  Laterality: N/A;  . I&D EXTREMITY Left 02/04/2013   Procedure: IRRIGATION AND DEBRIDEMENT Left Arm Laceration with Repair as Necessary;  Surgeon: Wylene Simmer, MD;  Location: [facility];  Service: Orthopedics;  Laterality: Left;  . INCISION AND DRAINAGE Left 02/04/2013   left arm laceration    Dr Doran Durand  . PROSTATE BIOPSY N/A 10/02/2014   Procedure: PROSTATE ULTRASOUND AND BIOPSY ;  Surgeon: Irine Seal, MD;  Location: [facility];  Service: Urology;  Laterality: N/A;       Home Medications    Prior to Admission medications   Medication Sig Start Date End Date Taking? Authorizing Provider  amoxicillin (AMOXIL) 500 MG capsule Take 1 capsule (500 mg total) by mouth every 8 (eight) hours. Patient not taking: Reported on 01/16/2015 12/13/14   Debbe Odea, MD  cephALEXin (KEFLEX) 500 MG capsule Take 1 capsule (500 mg total) by mouth 4 (four) times daily. 08/06/16   Palumbo, April, MD  degarelix (FIRMAGON) 80 MG injection Inject 80 mg into the skin every 28 (twenty-eight) days.    [provider]  lidocaine (XYLOCAINE) 2 % jelly Place 1 application into the urethra once.    [provider]  Miconazole Nitrate 2 % AERO Apply 1 application topically daily as needed (athlete's foot).    [provider]  nystatin cream (MYCOSTATIN) Apply 1 application topically 2 (two) times daily as needed (sores). To sores on bottom.    [provider]  polyethylene glycol (MIRALAX / GLYCOLAX) packet Take 17 g by mouth daily as needed for mild constipation. Hold if develops diarrhea Patient not taking: Reported on 01/16/2015 12/13/14   Debbe Odea, MD  senna-docusate (SENOKOT-S) 8.6-50 MG per tablet Take 1 tablet by mouth at bedtime as needed for mild constipation. Patient not taking: Reported on  01/16/2015 12/13/14   Debbe Odea, MD  tamsulosin (FLOMAX) 0.4 MG CAPS capsule Take 1 capsule (0.4 mg total) by mouth daily. 08/06/16   Palumbo, April, MD    Family History Family History  Problem Relation Age of Onset  . Glaucoma Mother   . Stroke Father   . Prostate cancer Neg Hx     Social History Social History   Tobacco Use  . Smoking status: Never Smoker  . Smokeless tobacco: Never Used  Substance Use Topics  . Alcohol use: No    Alcohol/week: 0.0 oz  . Drug use: No     Allergies   Patient has no known  allergies.   Review of Systems Review of Systems ROS reviewed and all are negative for acute change except as noted in the HPI.  Physical Exam Updated Vital Signs BP (!) 151/95 (BP Location: Right Arm)   Pulse (!) 112   Temp 97.9 F (36.6 C) (Oral)   Resp 16   Ht 5\' 3"  (1.6 m)   Wt 62.1 kg (137 lb)   SpO2 98%   BMI 24.27 kg/m   Physical Exam  Constitutional: He is oriented to person, place, and time. He appears well-developed and well-nourished. No distress.  HENT:  Head: Normocephalic and atraumatic.  Right Ear: Tympanic membrane, external ear and ear canal normal.  Left Ear: Tympanic membrane, external ear and ear canal normal.  Nose: Nose normal.  Mouth/Throat: Uvula is midline, oropharynx is clear and moist and mucous membranes are normal. No trismus in the jaw. No oropharyngeal exudate, posterior oropharyngeal erythema or tonsillar abscesses.  Eyes: EOM are normal. Pupils are equal, round, and reactive to light.  Neck: Normal range of motion. Neck supple. No tracheal deviation present.  Cardiovascular: Regular rhythm, S1 normal, S2 normal, normal heart sounds, intact distal pulses and normal pulses. Tachycardia present.  Pulmonary/Chest: Effort normal and breath sounds normal. No respiratory distress. He has no decreased breath sounds. He has no wheezes. He has no rhonchi. He has no rales.  Abdominal: Normal appearance and bowel sounds are normal. There is tenderness in the suprapubic area.  Mild TTP. Soft abdomen  Genitourinary:  Genitourinary Comments: Chaperone present. Foley in place. No hematuria. No output into bag.   Musculoskeletal: Normal range of motion.  Neurological: He is alert and oriented to person, place, and time.  Skin: Skin is warm and dry.  Psychiatric: He has a normal mood and affect. His speech is normal and behavior is normal. Thought content normal.  Nursing note and vitals reviewed.  ED Treatments / Results  Labs (all labs ordered are listed,  but only abnormal results are displayed) Labs Reviewed  URINALYSIS, ROUTINE W REFLEX MICROSCOPIC - Abnormal; Notable for the following components:      Result Value   APPearance CLOUDY (*)    Hgb urine dipstick SMALL (*)    Protein, ur 100 (*)    Leukocytes, UA LARGE (*)    Bacteria, UA MANY (*)    All other components within normal limits  I-STAT CHEM 8, ED - Abnormal; Notable for the following components:   BUN 29 (*)    Creatinine, Ser 1.40 (*)    Glucose, Bld 111 (*)    Hemoglobin 11.2 (*)    HCT 33.0 (*)    All other components within normal limits  URINE CULTURE    EKG  EKG Interpretation None       Radiology No results found.  Procedures Procedures (including critical care time)  Medications Ordered in ED Medications  cephALEXin (KEFLEX) capsule 500 mg (not administered)  lidocaine (XYLOCAINE) 2 % jelly 1 application (1 application Urethral Given 03/27/17 2301)     Initial Impression / Assessment and Plan / ED Course  I have reviewed the triage vital signs and the nursing notes.  Pertinent labs & imaging results that were available during my care of the patient were reviewed by me and considered in my medical decision making (see chart for details).  Final Clinical Impressions(s) / ED Diagnoses  {I have reviewed and evaluated the relevant laboratory values.   {I have reviewed the relevant previous healthcare records.  {I obtained HPI from historian. {Patient discussed with supervising physician.  ED Course:  Assessment: Pt is a 83 y.o. male presents to the Emergency Department today due to urinary retention. Hx same. Recent foley change on Friday. Notes chronic foley use x4 years due to prostate cancer. Pt notes mild suprapubic pain since 1pm today. Unable to produce urine. No hematuria. No back pain. No fevers. No CP/SOB. No cough/congestion. No N/V. On exam, pt in NAD. Nontoxic/nonseptic appearing. VSS. Afebrile. Lungs CTA. Heart RRR. Abdomen mild TTP. UA  with evidence of UTI. Culture sent. Chem 8 with stable creatinine. Attempted to irrigate foley without success. Changed in ED ability to void. Given ABX in ED. Plan is to DC home with follow up to Urology. At time of discharge, Patient is in no acute distress. Vital Signs are stable. Patient is able to ambulate. Patient able to tolerate PO.   Disposition/Plan:  DC Home Additional Verbal discharge instructions given and discussed with patient.  Pt Instructed to f/u with Urology in the next week for evaluation and treatment of symptoms. Return precautions given Pt acknowledges and agrees with plan  Supervising Physician Valarie Merino, MD  Final diagnoses:  Urinary retention  Urinary tract infection without hematuria, site unspecified    ED Discharge Orders    None       Shary Decamp, PA-C 03/27/17 2351    Valarie Merino, MD 03/28/17 [phone]

## 2017-03-27 NOTE — Discharge Instructions (Addendum)
Please read and follow all provided instructions.  Your diagnoses today include:  1. Urinary retention   2. Urinary tract infection without hematuria, site unspecified     Tests performed today include: Vital signs. See below for your results today.   Medications prescribed:  Take as prescribed   Home care instructions:  Follow any educational materials contained in this packet.  Follow-up instructions: Please follow-up with Urology for further evaluation of symptoms and treatment   Return instructions:  Please return to the Emergency Department if you do not get better, if you get worse, or new symptoms OR  - Fever (temperature greater than 101.21F)  - Bleeding that does not stop with holding pressure to the area    -Severe pain (please note that you may be more sore the day after your accident)  - Chest Pain  - Difficulty breathing  - Severe nausea or vomiting  - Inability to tolerate food and liquids  - Passing out  - Skin becoming red around your wounds  - Change in mental status (confusion or lethargy)  - New numbness or weakness    Please return if you have any other emergent concerns.  Additional Information:  Your vital signs today were: BP (!) 163/97    Pulse (!) 107    Temp 97.9 F (36.6 C) (Oral)    Resp 18    Ht 5\' 3"  (1.6 m)    Wt 62.1 kg (137 lb)    SpO2 98%    BMI 24.27 kg/m  If your blood pressure (BP) was elevated above 135/85 this visit, please have this repeated by your doctor within one month. ---------------

## 2017-03-27 NOTE — ED Triage Notes (Signed)
Pt states he has a urinary catheter that has not drained since 1pm

## 2017-03-27 NOTE — ED Notes (Signed)
Pt family stated that the foley has not been changed since November 30.

## 2017-03-29 LAB — URINE CULTURE

## 2017-04-30 ENCOUNTER — Ambulatory Visit (HOSPITAL_COMMUNITY)
Admission: EM | Admit: 2017-04-30 | Discharge: 2017-04-30 | Disposition: A | Discharge status: Home / Self Care | Payer: Medicare HMO | Attending: Internal Medicine | Admitting: Internal Medicine

## 2017-04-30 ENCOUNTER — Encounter (HOSPITAL_COMMUNITY): Payer: Self-pay | Admitting: *Deleted

## 2017-04-30 ENCOUNTER — Other Ambulatory Visit: Payer: Self-pay

## 2017-04-30 DIAGNOSIS — R339 Retention of urine, unspecified: Secondary | ICD-10-CM | POA: Diagnosis present

## 2017-04-30 DIAGNOSIS — Z8546 Personal history of malignant neoplasm of prostate: Secondary | ICD-10-CM | POA: Insufficient documentation

## 2017-04-30 DIAGNOSIS — Z79899 Other long term (current) drug therapy: Secondary | ICD-10-CM | POA: Insufficient documentation

## 2017-04-30 DIAGNOSIS — Z85828 Personal history of other malignant neoplasm of skin: Secondary | ICD-10-CM | POA: Diagnosis not present

## 2017-04-30 DIAGNOSIS — E875 Hyperkalemia: Secondary | ICD-10-CM | POA: Insufficient documentation

## 2017-04-30 DIAGNOSIS — T83511A Infection and inflammatory reaction due to indwelling urethral catheter, initial encounter: Secondary | ICD-10-CM | POA: Diagnosis not present

## 2017-04-30 DIAGNOSIS — N179 Acute kidney failure, unspecified: Secondary | ICD-10-CM | POA: Diagnosis not present

## 2017-04-30 DIAGNOSIS — T83091A Other mechanical complication of indwelling urethral catheter, initial encounter: Secondary | ICD-10-CM | POA: Diagnosis not present

## 2017-04-30 DIAGNOSIS — R4182 Altered mental status, unspecified: Secondary | ICD-10-CM | POA: Insufficient documentation

## 2017-04-30 DIAGNOSIS — R102 Pelvic and perineal pain: Secondary | ICD-10-CM | POA: Diagnosis not present

## 2017-04-30 DIAGNOSIS — N390 Urinary tract infection, site not specified: Secondary | ICD-10-CM | POA: Diagnosis not present

## 2017-04-30 DIAGNOSIS — D638 Anemia in other chronic diseases classified elsewhere: Secondary | ICD-10-CM | POA: Diagnosis not present

## 2017-04-30 HISTORY — DX: Presence of urogenital implants: Z96.0

## 2017-04-30 LAB — POCT URINALYSIS DIP (DEVICE)
Bilirubin Urine: NEGATIVE
GLUCOSE, UA: NEGATIVE mg/dL
Ketones, ur: NEGATIVE mg/dL
NITRITE: POSITIVE — AB
SPECIFIC GRAVITY, URINE: 1.025 (ref 1.005–1.030)
UROBILINOGEN UA: 0.2 mg/dL (ref 0.0–1.0)
pH: 7 (ref 5.0–8.0)

## 2017-04-30 NOTE — ED Notes (Signed)
Pt denies any abd pain.  Family reports emptying leg bag just PTA; approx 100cc urine noted in bag now.  Upon attempt to empty leg bag, thick pus dripping from spout, clogging urine in bag.  PA notified.  Cath irrigated with 50cc saline without any difficulty or resistance; 50cc withdrawn without any difficulty.  Pt tolerated well.  New leg bag attached.  PO fluids provided to pt.

## 2017-04-30 NOTE — Discharge Instructions (Signed)
Please continue his normal catheters care. Catheter flushed and draining well today.   Please return if symptoms persisting, return, develop abdominal pain, pelvic pain, fevers, changes in his mental status or alertness.   Urine showed evidence of infection.  Please start keflex prescription, Be sure to take full course. Stay hydrated- urine should be pale yellow to clear.   Please return or follow up with your primary provider if symptoms not improving with treatment. Please return sooner if you have worsening of symptoms or develop fever, nausea, vomiting, abdominal pain, back pain, lightheadedness, dizziness.

## 2017-04-30 NOTE — ED Notes (Signed)
Additional PO fluids provided.  Pt tolerating well.

## 2017-04-30 NOTE — ED Notes (Signed)
Approx 50cc slightly cloudy yellow urine noted in leg bag.  Pt continues without c/o's.
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
## 2017-05-01 LAB — URINE CULTURE

## 2017-06-06 ENCOUNTER — Other Ambulatory Visit: Payer: Self-pay

## 2017-06-06 ENCOUNTER — Inpatient Hospital Stay (HOSPITAL_COMMUNITY): Payer: Non-veteran care

## 2017-06-06 ENCOUNTER — Emergency Department (HOSPITAL_COMMUNITY): Payer: Non-veteran care

## 2017-06-06 ENCOUNTER — Encounter (HOSPITAL_COMMUNITY): Payer: Self-pay | Admitting: Emergency Medicine

## 2017-06-06 ENCOUNTER — Inpatient Hospital Stay (HOSPITAL_COMMUNITY)
Admission: EM | Admit: 2017-06-06 | Discharge: 2017-06-12 | DRG: 698 | Disposition: A | Discharge status: Skilled Nursing Facility | Payer: Non-veteran care | Attending: Internal Medicine | Admitting: Internal Medicine

## 2017-06-06 DIAGNOSIS — N179 Acute kidney failure, unspecified: Secondary | ICD-10-CM | POA: Diagnosis present

## 2017-06-06 DIAGNOSIS — N320 Bladder-neck obstruction: Secondary | ICD-10-CM | POA: Diagnosis present

## 2017-06-06 DIAGNOSIS — Y846 Urinary catheterization as the cause of abnormal reaction of the patient, or of later complication, without mention of misadventure at the time of the procedure: Secondary | ICD-10-CM | POA: Diagnosis present

## 2017-06-06 DIAGNOSIS — A419 Sepsis, unspecified organism: Secondary | ICD-10-CM | POA: Diagnosis present

## 2017-06-06 DIAGNOSIS — R262 Difficulty in walking, not elsewhere classified: Secondary | ICD-10-CM | POA: Diagnosis not present

## 2017-06-06 DIAGNOSIS — N3001 Acute cystitis with hematuria: Secondary | ICD-10-CM

## 2017-06-06 DIAGNOSIS — N2889 Other specified disorders of kidney and ureter: Secondary | ICD-10-CM | POA: Diagnosis not present

## 2017-06-06 DIAGNOSIS — Z87440 Personal history of urinary (tract) infections: Secondary | ICD-10-CM

## 2017-06-06 DIAGNOSIS — E876 Hypokalemia: Secondary | ICD-10-CM | POA: Diagnosis not present

## 2017-06-06 DIAGNOSIS — E872 Acidosis: Secondary | ICD-10-CM | POA: Diagnosis present

## 2017-06-06 DIAGNOSIS — R319 Hematuria, unspecified: Secondary | ICD-10-CM | POA: Diagnosis present

## 2017-06-06 DIAGNOSIS — L8990 Pressure ulcer of unspecified site, unspecified stage: Secondary | ICD-10-CM

## 2017-06-06 DIAGNOSIS — G9341 Metabolic encephalopathy: Secondary | ICD-10-CM | POA: Diagnosis present

## 2017-06-06 DIAGNOSIS — N136 Pyonephrosis: Secondary | ICD-10-CM | POA: Diagnosis present

## 2017-06-06 DIAGNOSIS — D638 Anemia in other chronic diseases classified elsewhere: Secondary | ICD-10-CM | POA: Diagnosis present

## 2017-06-06 DIAGNOSIS — N183 Chronic kidney disease, stage 3 unspecified: Secondary | ICD-10-CM | POA: Diagnosis present

## 2017-06-06 DIAGNOSIS — R1311 Dysphagia, oral phase: Secondary | ICD-10-CM | POA: Diagnosis not present

## 2017-06-06 DIAGNOSIS — Z85828 Personal history of other malignant neoplasm of skin: Secondary | ICD-10-CM

## 2017-06-06 DIAGNOSIS — R339 Retention of urine, unspecified: Secondary | ICD-10-CM | POA: Diagnosis not present

## 2017-06-06 DIAGNOSIS — R6520 Severe sepsis without septic shock: Secondary | ICD-10-CM | POA: Diagnosis present

## 2017-06-06 DIAGNOSIS — N1330 Unspecified hydronephrosis: Secondary | ICD-10-CM | POA: Diagnosis not present

## 2017-06-06 DIAGNOSIS — T83518A Infection and inflammatory reaction due to other urinary catheter, initial encounter: Principal | ICD-10-CM | POA: Diagnosis present

## 2017-06-06 DIAGNOSIS — R278 Other lack of coordination: Secondary | ICD-10-CM | POA: Diagnosis not present

## 2017-06-06 DIAGNOSIS — G9340 Encephalopathy, unspecified: Secondary | ICD-10-CM | POA: Diagnosis present

## 2017-06-06 DIAGNOSIS — T8189XA Other complications of procedures, not elsewhere classified, initial encounter: Secondary | ICD-10-CM | POA: Diagnosis not present

## 2017-06-06 DIAGNOSIS — Z79890 Hormone replacement therapy (postmenopausal): Secondary | ICD-10-CM | POA: Diagnosis not present

## 2017-06-06 DIAGNOSIS — C61 Malignant neoplasm of prostate: Secondary | ICD-10-CM | POA: Diagnosis not present

## 2017-06-06 DIAGNOSIS — Z66 Do not resuscitate: Secondary | ICD-10-CM | POA: Diagnosis present

## 2017-06-06 DIAGNOSIS — M6281 Muscle weakness (generalized): Secondary | ICD-10-CM | POA: Diagnosis not present

## 2017-06-06 DIAGNOSIS — T83198A Other mechanical complication of other urinary devices and implants, initial encounter: Secondary | ICD-10-CM | POA: Diagnosis not present

## 2017-06-06 DIAGNOSIS — E039 Hypothyroidism, unspecified: Secondary | ICD-10-CM | POA: Diagnosis present

## 2017-06-06 DIAGNOSIS — N390 Urinary tract infection, site not specified: Secondary | ICD-10-CM | POA: Diagnosis not present

## 2017-06-06 DIAGNOSIS — R402441 Other coma, without documented Glasgow coma scale score, or with partial score reported, in the field [EMT or ambulance]: Secondary | ICD-10-CM | POA: Diagnosis not present

## 2017-06-06 LAB — COMPREHENSIVE METABOLIC PANEL
ALBUMIN: 3.4 g/dL — AB (ref 3.5–5.0)
ALT: 11 U/L — ABNORMAL LOW (ref 17–63)
AST: 20 U/L (ref 15–41)
Alkaline Phosphatase: 66 U/L (ref 38–126)
Anion gap: 10 (ref 5–15)
BUN: 29 mg/dL — ABNORMAL HIGH (ref 6–20)
CO2: 22 mmol/L (ref 22–32)
Calcium: 8.8 mg/dL — ABNORMAL LOW (ref 8.9–10.3)
Chloride: 107 mmol/L (ref 101–111)
Creatinine, Ser: 1.37 mg/dL — ABNORMAL HIGH (ref 0.61–1.24)
GFR calc Af Amer: 48 mL/min — ABNORMAL LOW (ref >60)
GFR calc non Af Amer: 42 mL/min — ABNORMAL LOW (ref >60)
GLUCOSE: 104 mg/dL — AB (ref 65–99)
POTASSIUM: 4.3 mmol/L (ref 3.5–5.1)
SODIUM: 139 mmol/L (ref 135–145)
Total Bilirubin: 0.7 mg/dL (ref 0.3–1.2)
Total Protein: 6.3 g/dL — ABNORMAL LOW (ref 6.5–8.1)

## 2017-06-06 LAB — URINALYSIS, ROUTINE W REFLEX MICROSCOPIC
BILIRUBIN URINE: NEGATIVE
Glucose, UA: NEGATIVE mg/dL
KETONES UR: NEGATIVE mg/dL
Nitrite: POSITIVE — AB
PH: 7 (ref 5.0–8.0)
Protein, ur: 100 mg/dL — AB
Specific Gravity, Urine: 1.011 (ref 1.005–1.030)

## 2017-06-06 LAB — CBC WITH DIFFERENTIAL/PLATELET
BASOS ABS: 0 10*3/uL (ref 0.0–0.1)
Basophils Relative: 0 %
Eosinophils Absolute: 0.1 10*3/uL (ref 0.0–0.7)
Eosinophils Relative: 1 %
HEMATOCRIT: 30.7 % — AB (ref 39.0–52.0)
HEMOGLOBIN: 9.8 g/dL — AB (ref 13.0–17.0)
LYMPHS PCT: 2 %
Lymphs Abs: 0.2 10*3/uL — ABNORMAL LOW (ref 0.7–4.0)
MCH: 28.8 pg (ref 26.0–34.0)
MCHC: 31.9 g/dL (ref 30.0–36.0)
MCV: 90.3 fL (ref 78.0–100.0)
MONO ABS: 0 10*3/uL — AB (ref 0.1–1.0)
MONOS PCT: 0 %
NEUTROS ABS: 8.7 10*3/uL — AB (ref 1.7–7.7)
NEUTROS PCT: 97 %
Platelets: 195 10*3/uL (ref 150–400)
RBC: 3.4 MIL/uL — ABNORMAL LOW (ref 4.22–5.81)
RDW: 14.1 % (ref 11.5–15.5)
WBC: 9 10*3/uL (ref 4.0–10.5)

## 2017-06-06 LAB — I-STAT CG4 LACTIC ACID, ED: LACTIC ACID, VENOUS: 1.18 mmol/L (ref 0.5–1.9)

## 2017-06-06 MED ORDER — ACETAMINOPHEN 325 MG PO TABS
650.0000 mg | ORAL_TABLET | Freq: Four times a day (QID) | ORAL | Status: DC | PRN
  Administered 2017-06-06 (×3): 650 mg via ORAL
  Filled 2017-06-06 (×4): qty 2

## 2017-06-06 MED ORDER — SODIUM CHLORIDE 0.9 % IV BOLUS (SEPSIS)
1000.0000 mL | Freq: Once | INTRAVENOUS | Status: AC
  Administered 2017-06-06: 1000 mL via INTRAVENOUS

## 2017-06-06 MED ORDER — ONDANSETRON HCL 4 MG PO TABS: 4.0000 mg | ORAL_TABLET | Freq: Four times a day (QID) | ORAL | Status: DC | PRN

## 2017-06-06 MED ORDER — ACETAMINOPHEN 325 MG PO TABS
650.0000 mg | ORAL_TABLET | Freq: Once | ORAL | Status: AC
  Administered 2017-06-06: 650 mg via ORAL
  Filled 2017-06-06: qty 2

## 2017-06-06 MED ORDER — ONDANSETRON HCL 4 MG/2ML IJ SOLN: 4.0000 mg | Freq: Four times a day (QID) | INTRAMUSCULAR | Status: DC | PRN

## 2017-06-06 MED ORDER — SODIUM CHLORIDE 0.9 % IV SOLN
2.0000 g | Freq: Once | INTRAVENOUS | Status: AC
  Administered 2017-06-06: 2 g via INTRAVENOUS
  Filled 2017-06-06: qty 20

## 2017-06-06 MED ORDER — LIDOCAINE 5 % EX OINT
TOPICAL_OINTMENT | Freq: Every day | CUTANEOUS | Status: DC | PRN
  Administered 2017-06-06: 23:00:00 via TOPICAL
  Administered 2017-06-10: 1 via TOPICAL
  Administered 2017-06-11: 07:00:00 via TOPICAL
  Filled 2017-06-06 (×2): qty 35.44

## 2017-06-06 MED ORDER — SODIUM CHLORIDE 0.9 % IV SOLN
2.0000 g | Freq: Every day | INTRAVENOUS | Status: DC
  Administered 2017-06-06: 2 g via INTRAVENOUS
  Filled 2017-06-06 (×2): qty 2

## 2017-06-06 MED ORDER — ACETAMINOPHEN 650 MG RE SUPP: 650.0000 mg | Freq: Four times a day (QID) | RECTAL | Status: DC | PRN

## 2017-06-06 MED ORDER — SODIUM CHLORIDE 0.9 % IV SOLN
INTRAVENOUS | Status: DC
  Administered 2017-06-06 – 2017-06-09 (×7): via INTRAVENOUS
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
## 2017-06-06 NOTE — H&P (Signed)
History and Physical    Anthony Nicholson YQM:[id number] DOB: 04-16-1918 DOA: 06/06/2017  PCP: [facility]  Patient coming from: Home  I have personally briefly reviewed patient's old medical records in [place]  Chief Complaint: Catheter Pain  HPI: Anthony Nicholson is a 84 y.o. male with medical history significant of Prostate CA managed non-op given patients age.  Urinary retention managed with chronic foley for past 4 years.  Frequent UTIs which usually grow out poly-microbial organisms on cultures.  Patient had blocked foley yesterday.  Foley changed out at that time.  Hematuria since that time with pain.  In to ED today.  Patient confused and apparently off of baseline right now per family.  At baseline patient lives independently still despite age.  Has home health RN come to change foley's.  Takes synthroid.  But that's all.   ED Course: Tm 103.8, tachycardia to 120, improves to 91 after 40ml /kg bolus.  WBC 9k.  Creat 1.37 (seems to be about baseline).  CXR neg.  UA pending.   Review of Systems: As per HPI otherwise 10 point review of systems negative.   Past Medical History:  Diagnosis Date  . Basal cell carcinoma   . HOH (hard of hearing)   . Indwelling urinary catheter present   . Laceration of arm 02/04/2013    Past Surgical History:  Procedure Laterality Date  .  sharpnel removal    1943    . CYSTOSCOPY N/A 10/02/2014   Procedure: FLEXIBLE CYSTOSCOPY;  Surgeon: Irine Seal, MD;  Location: [facility];  Service: Urology;  Laterality: N/A;  . I&D EXTREMITY Left 02/04/2013   Procedure: IRRIGATION AND DEBRIDEMENT Left Arm Laceration with Repair as Necessary;  Surgeon: Wylene Simmer, MD;  Location: [facility];  Service: Orthopedics;  Laterality: Left;  . INCISION AND DRAINAGE Left 02/04/2013   left arm laceration    Dr Doran Durand  . PROSTATE BIOPSY N/A 10/02/2014   Procedure: PROSTATE ULTRASOUND AND BIOPSY ;  Surgeon: Irine Seal, MD;  Location: [facility];  Service: Urology;   Laterality: N/A;     reports that  has never smoked. he has never used smokeless tobacco. He reports that he does not drink alcohol or use drugs.  No Known Allergies  Family History  Problem Relation Age of Onset  . Glaucoma Mother   . Stroke Father   . Prostate cancer Neg Hx      Prior to Admission medications   Medication Sig Start Date End Date Taking? Authorizing Provider  amoxicillin (AMOXIL) 500 MG capsule Take 1 capsule (500 mg total) by mouth every 8 (eight) hours. Patient not taking: Reported on 01/16/2015 12/13/14   Debbe Odea, MD  degarelix [facility]) 80 MG injection Inject 80 mg into the skin every 28 (twenty-eight) days.    [provider]  Levothyroxine Sodium (SYNTHROID PO) Take by mouth.    [provider]  lidocaine (XYLOCAINE) 2 % jelly Place 1 application into the urethra once.    [provider]  Miconazole Nitrate 2 % AERO Apply 1 application topically daily as needed (athlete's foot).    [provider]  nystatin cream (MYCOSTATIN) Apply 1 application topically 2 (two) times daily as needed (sores). To sores on bottom.    [provider]  polyethylene glycol (MIRALAX / GLYCOLAX) packet Take 17 g by mouth daily as needed for mild constipation. Hold if develops diarrhea Patient not taking: Reported on 01/16/2015 12/13/14   Debbe Odea, MD  senna-docusate (SENOKOT-S)  8.6-50 MG per tablet Take 1 tablet by mouth at bedtime as needed for mild constipation. Patient not taking: Reported on 01/16/2015 12/13/14   Debbe Odea, MD  tamsulosin (FLOMAX) 0.4 MG CAPS capsule Take 1 capsule (0.4 mg total) by mouth daily. 08/06/16   Palumbo, April, MD    Physical Exam: Vitals:   06/06/17 0400 06/06/17 0430 06/06/17 0520 06/06/17 0521  BP: (!) 111/51 (!) 124/58 120/62 120/69  Pulse: (!) 113 (!) 110 (!) 104 91  Resp: (!) 24 18 20 19   Temp:      TempSrc:      SpO2: 95% 97% 97% 97%  Weight:      Height:        Constitutional:  NAD, calm, comfortable Eyes: PERRL, lids and conjunctivae normal ENMT: Mucous membranes are moist. Posterior pharynx clear of any exudate or lesions.Normal dentition.  Neck: normal, supple, no masses, no thyromegaly Respiratory: clear to auscultation bilaterally, no wheezing, no crackles. Normal respiratory effort. No accessory muscle use.  Cardiovascular: Regular rate and rhythm, no murmurs / rubs / gallops. No extremity edema. 2+ pedal pulses. No carotid bruits.  Abdomen: no tenderness, no masses palpated. No hepatosplenomegaly. Bowel sounds positive.  Musculoskeletal: no clubbing / cyanosis. No joint deformity upper and lower extremities. Good ROM, no contractures. Normal muscle tone.  Skin: no rashes, lesions, ulcers. No induration Neurologic: CN 2-12 grossly intact. Sensation intact, DTR normal. Strength 5/5 in all 4.  Psychiatric: Normal judgment and insight. Alert and oriented x 3. Normal mood.    Labs on Admission: I have personally reviewed following labs and imaging studies  CBC: Recent Labs  Lab 06/06/17 0324  WBC 9.0  NEUTROABS 8.7*  HGB 9.8*  HCT 30.7*  MCV 90.3  PLT 297   Basic Metabolic Panel: Recent Labs  Lab 06/06/17 0324  NA 139  K 4.3  CL 107  CO2 22  GLUCOSE 104*  BUN 29*  CREATININE 1.37*  CALCIUM 8.8*   GFR: Estimated Creatinine Clearance: 26.1 mL/min (A) (by C-G formula based on SCr of 1.37 mg/dL (H)). Liver Function Tests: Recent Labs  Lab 06/06/17 0324  AST 20  ALT 11*  ALKPHOS 66  BILITOT 0.7  PROT 6.3*  ALBUMIN 3.4*   No results for input(s): LIPASE, AMYLASE in the last 168 hours. No results for input(s): AMMONIA in the last 168 hours. Coagulation Profile: No results for input(s): INR, PROTIME in the last 168 hours. Cardiac Enzymes: No results for input(s): CKTOTAL, CKMB, CKMBINDEX, TROPONINI in the last 168 hours. BNP (last 3 results) No results for input(s): PROBNP in the last 8760 hours. HbA1C: No results for input(s): HGBA1C  in the last 72 hours. CBG: No results for input(s): GLUCAP in the last 168 hours. Lipid Profile: No results for input(s): CHOL, HDL, LDLCALC, TRIG, CHOLHDL, LDLDIRECT in the last 72 hours. Thyroid Function Tests: No results for input(s): TSH, T4TOTAL, FREET4, T3FREE, THYROIDAB in the last 72 hours. Anemia Panel: No results for input(s): VITAMINB12, FOLATE, FERRITIN, TIBC, IRON, RETICCTPCT in the last 72 hours. Urine analysis:    Component Value Date/Time   COLORURINE YELLOW 03/27/2017 2133   APPEARANCEUR CLOUDY (A) 03/27/2017 2133   LABSPEC 1.025 04/30/2017 1836   PHURINE 7.0 04/30/2017 1836   GLUCOSEU NEGATIVE 04/30/2017 1836   HGBUR LARGE (A) 04/30/2017 1836   BILIRUBINUR NEGATIVE 04/30/2017 1836   KETONESUR NEGATIVE 04/30/2017 1836   PROTEINUR >=300 (A) 04/30/2017 1836   UROBILINOGEN 0.2 04/30/2017 1836   NITRITE POSITIVE (A) 04/30/2017 1836  LEUKOCYTESUR SMALL (A) 04/30/2017 1836    Radiological Exams on Admission: Dg Chest 2 View  Result Date: 06/06/2017 CLINICAL DATA:  Sepsis and fever EXAM: CHEST  2 VIEW COMPARISON:  12/11/2014 FINDINGS: No consolidation or pleural effusion. Cardiomediastinal silhouette within normal limits. Aortic atherosclerosis. No pneumothorax. Degenerative changes of the spine. IMPRESSION: No active cardiopulmonary disease. Electronically Signed   By: Donavan Foil M.D.   On: 06/06/2017 03:46    EKG: Independently reviewed.  Assessment/Plan Principal Problem:   Sepsis ([place]) Active Problems:   Urinary retention   Adenocarcinoma of prostate ([place])   Lower urinary tract infectious disease   Hematuria   Acute encephalopathy   CKD (chronic kidney disease) stage 3, GFR 30-59 ml/min (HCC)    1. Sepsis - suspect UTI most likely 1. Cefepime for empiric CAUTI coverage 2. UCx and UA pending 3. Got 46ml / kg bolus, will now put on 100 cc/hr NS 4. Will get B renal US 2. Acute encephalopathy - secondary to sepsis above 3. Chronic urinary retention  - 1. Due to prostate CA, non-operative management 2. Foley was just changed yesterday, so will leave this in place for now.  Noted to be draining. 4. CKD stage 3 - appears to be chronic and stable  DVT prophylaxis: SCDs due to hematuria Code Status: DNR - yellow form at bedside Family Communication: Family at bedside Disposition Plan: Home after admit Consults called: None Admission status: Admit to inpatient - IP status for sepsis, CAUTI, AMS.   Etta Quill DO [facility] Pager [phone]  If 7AM-7PM, please contact day team taking care of patient [email or web address] Password [facility]  06/06/2017, 5:24 AM

## 2017-06-06 NOTE — ED Provider Notes (Signed)
[facility] DEPT Provider Note   CSN: [id number] Arrival date & time: 06/06/17  0305     History   Chief Complaint No chief complaint on file.   HPI Anthony Nicholson is a 84 y.o. male.  The history is provided by the patient and medical records.    84 year old male with history of indwelling Foley catheter secondary to prostate cancer, history of frequent UTIs, presenting to the ED with reported catheter pain.  On arrival here, patient denies any pain.  He is noted to be warm to the touch and tachycardic.  Rectal temp performed, 103.7F.  Family arrived during initial evaluation.  Reports they have not been told of any fever recently but he did have his Foley catheter changed yesterday.  States occasionally his catheter will get blocked when it is changed.  Reports usually it is changed every 4 weeks or so.  States there was a new home health nurse that changed on Friday though they have never seen before.  He does have history of frequent UTIs, they are rarely able to pin down the source as he usually grows out multiple organisms.  Past Medical History:  Diagnosis Date  . Basal cell carcinoma   . HOH (hard of hearing)   . Indwelling urinary catheter present   . Laceration of arm 02/04/2013    Patient Active Problem List   Diagnosis Date Noted  . Elevated troponin I level 12/12/2014  . Demand ischemia ([place])   . Sepsis ([place]) 12/09/2014  . UTI (lower urinary tract infection) 12/09/2014  . Adenocarcinoma of prostate ([place]) 10/03/2014  . Acute renal failure syndrome ([place])   . Elevated PSA 10/01/2014  . Nodular prostate 10/01/2014  . Urinary retention   . Acute urinary retention 09/29/2014  . Acute renal failure ([place]) 09/29/2014  . Hyperkalemia 09/29/2014  . Anemia of chronic disease 09/29/2014  . Pain in lower limb 08/09/2013  . Onychomycosis 11/09/2012  . Pain in joint, ankle and foot 11/09/2012    Past Surgical History:  Procedure Laterality  Date  .  sharpnel removal    1943    . CYSTOSCOPY N/A 10/02/2014   Procedure: FLEXIBLE CYSTOSCOPY;  Surgeon: Irine Seal, MD;  Location: [facility];  Service: Urology;  Laterality: N/A;  . I&D EXTREMITY Left 02/04/2013   Procedure: IRRIGATION AND DEBRIDEMENT Left Arm Laceration with Repair as Necessary;  Surgeon: Wylene Simmer, MD;  Location: [facility];  Service: Orthopedics;  Laterality: Left;  . INCISION AND DRAINAGE Left 02/04/2013   left arm laceration    Dr Doran Durand  . PROSTATE BIOPSY N/A 10/02/2014   Procedure: PROSTATE ULTRASOUND AND BIOPSY ;  Surgeon: Irine Seal, MD;  Location: [facility];  Service: Urology;  Laterality: N/A;       Home Medications    Prior to Admission medications   Medication Sig Start Date End Date Taking? Authorizing Provider  amoxicillin (AMOXIL) 500 MG capsule Take 1 capsule (500 mg total) by mouth every 8 (eight) hours. Patient not taking: Reported on 01/16/2015 12/13/14   Debbe Odea, MD  degarelix [facility]) 80 MG injection Inject 80 mg into the skin every 28 (twenty-eight) days.    [provider]  Levothyroxine Sodium (SYNTHROID PO) Take by mouth.    [provider]  lidocaine (XYLOCAINE) 2 % jelly Place 1 application into the urethra once.    [provider]  Miconazole Nitrate 2 % AERO Apply 1 application topically daily as needed (athlete's foot).    [provider]  nystatin cream (MYCOSTATIN) Apply 1 application topically 2 (two) times daily as needed (sores). To sores on bottom.    [provider]  polyethylene glycol (MIRALAX / GLYCOLAX) packet Take 17 g by mouth daily as needed for mild constipation. Hold if develops diarrhea Patient not taking: Reported on 01/16/2015 12/13/14   Debbe Odea, MD  senna-docusate (SENOKOT-S) 8.6-50 MG per tablet Take 1 tablet by mouth at bedtime as needed for mild constipation. Patient not taking: Reported on 01/16/2015 12/13/14   Debbe Odea, MD  tamsulosin (FLOMAX) 0.4 MG CAPS capsule  Take 1 capsule (0.4 mg total) by mouth daily. 08/06/16   Palumbo, April, MD    Family History Family History  Problem Relation Age of Onset  . Glaucoma Mother   . Stroke Father   . Prostate cancer Neg Hx     Social History Social History   Tobacco Use  . Smoking status: Never Smoker  . Smokeless tobacco: Never Used  Substance Use Topics  . Alcohol use: No    Alcohol/week: 0.0 oz  . Drug use: No     Allergies   Patient has no known allergies.   Review of Systems Review of Systems  Constitutional: Positive for fever.  All other systems reviewed and are negative.    Physical Exam Updated Vital Signs BP 120/69 (BP Location: Left Arm)   Pulse 91   Temp (!) 103.8 F (39.9 C) (Rectal)   Resp 19   Ht 5\' 6"  (1.676 m)   Wt 59.9 kg (132 lb)   SpO2 97%   BMI 21.31 kg/m   Physical Exam  Constitutional: He is oriented to person, place, and time. He appears well-developed and well-nourished.  Elderly, thin, frail  HENT:  Head: Normocephalic and atraumatic.  Mouth/Throat: Oropharynx is clear and moist.  Eyes: Conjunctivae and EOM are normal. Pupils are equal, round, and reactive to light.  Neck: Normal range of motion.  Cardiovascular: Regular rhythm and normal heart sounds. Tachycardia present.  Pulmonary/Chest: Effort normal and breath sounds normal. No stridor. No respiratory distress.  Abdominal: Soft. Bowel sounds are normal. There is no tenderness. There is no rebound.  Genitourinary:  Genitourinary Comments: Foley catheter in place, appears to be some mild bleeding around the site as well as in collection bag  Musculoskeletal: Normal range of motion.  Neurological: He is alert and oriented to person, place, and time.  Skin: Skin is warm and dry.  Psychiatric: He has a normal mood and affect.  Nursing note and vitals reviewed.    ED Treatments / Results  Labs (all labs ordered are listed, but only abnormal results are displayed) Labs Reviewed    COMPREHENSIVE METABOLIC PANEL - Abnormal; Notable for the following components:      Result Value   Glucose, Bld 104 (*)    BUN 29 (*)    Creatinine, Ser 1.37 (*)    Calcium 8.8 (*)    Total Protein 6.3 (*)    Albumin 3.4 (*)    ALT 11 (*)    GFR calc non Af Amer 42 (*)    GFR calc Af Amer 48 (*)    All other components within normal limits  CBC WITH DIFFERENTIAL/PLATELET - Abnormal; Notable for the following components:   RBC 3.40 (*)    Hemoglobin 9.8 (*)    HCT 30.7 (*)    Neutro Abs 8.7 (*)    Lymphs Abs 0.2 (*)    Monocytes Absolute 0.0 (*)  All other components within normal limits  CULTURE, BLOOD (ROUTINE X 2)  CULTURE, BLOOD (ROUTINE X 2)  URINE CULTURE  URINALYSIS, ROUTINE W REFLEX MICROSCOPIC  I-STAT CG4 LACTIC ACID, ED  I-STAT CG4 LACTIC ACID, ED    EKG  EKG Interpretation  Date/Time:  Tuesday June 06 2017 62:26:33 EST Ventricular Rate:  119 PR Interval:    QRS Duration: 124 QT Interval:  328 QTC Calculation: 462 R Axis:   72 Text Interpretation:  Sinus tachycardia Ventricular premature complex Right bundle branch block When compared with ECG of 12/10/2014, HEART RATE has increased Right bundle branch block is now present Premature ventricular complexes are now present Confirmed by Delora Fuel ([id number]) on 06/06/2017 3:38:15 AM       Radiology Dg Chest 2 View  Result Date: 06/06/2017 CLINICAL DATA:  Sepsis and fever EXAM: CHEST  2 VIEW COMPARISON:  12/11/2014 FINDINGS: No consolidation or pleural effusion. Cardiomediastinal silhouette within normal limits. Aortic atherosclerosis. No pneumothorax. Degenerative changes of the spine. IMPRESSION: No active cardiopulmonary disease. Electronically Signed   By: Donavan Foil M.D.   On: 06/06/2017 03:46    Procedures Procedures (including critical care time)  Medications Ordered in ED Medications  acetaminophen (TYLENOL) tablet 650 mg (not administered)    Or  acetaminophen (TYLENOL) suppository 650 mg  (not administered)  ondansetron (ZOFRAN) tablet 4 mg (not administered)    Or  ondansetron (ZOFRAN) injection 4 mg (not administered)  0.9 %  sodium chloride infusion (not administered)  ceFEPIme (MAXIPIME) 2 g in sodium chloride 0.9 % 100 mL IVPB (not administered)  sodium chloride 0.9 % bolus 1,000 mL (1,000 mLs Intravenous New Bag/Given 06/06/17 0406)    And  sodium chloride 0.9 % bolus 1,000 mL (1,000 mLs Intravenous New Bag/Given 06/06/17 0449)  cefTRIAXone (ROCEPHIN) 2 g in sodium chloride 0.9 % 100 mL IVPB (0 g Intravenous Stopped 06/06/17 0448)  acetaminophen (TYLENOL) tablet 650 mg (650 mg Oral Given 06/06/17 0406)     Initial Impression / Assessment and Plan / ED Course  I have reviewed the triage vital signs and the nursing notes.  Pertinent labs & imaging results that were available during my care of the patient were reviewed by me and considered in my medical decision making (see chart for details).  84 y.o. M here with reported catheter pain, however denies this to me.  He is found to be febrile, tachycardic.  There is some bloody output from foley catheter that was changed yesterday by new home health RN.  Suspect sepsis secondary to CAUTI. Work-up pending.  Patient given 30c/kg IVFB as well as 2g [place].   Last few urine cultures grew out multiple species, may need to broaden abx coverage.  Labs as above.  Normal lactate and WBC count.  CXR clear.  Blood cultures pending.  HR improving here with fluids.  BP remains stable.  As patient has history of recurrent UTI, feel he will require admission for ongoing IV abx.  Discussed with hospitalist, Dr. Hurley Cisco-- he will admit for ongoing care.  Final Clinical Impressions(s) / ED Diagnoses   Final diagnoses:  Sepsis, due to unspecified organism [facility])  Acute cystitis with hematuria    ED Discharge Orders    None       Larene Pickett, PA-C 25/63/89 [id number]    Delora Fuel, MD 28/76/81 [phone]

## 2017-06-06 NOTE — ED Notes (Signed)
Meal tray provided.

## 2017-06-06 NOTE — ED Notes (Signed)
Bed: WA17 Expected date:  Expected time:  Means of arrival:  Comments: EMS 

## 2017-06-06 NOTE — ED Notes (Signed)
Patient repositioned in bed onto right side. Daughter remains with patient. Patient remains increasingly agitated. Attempted to reposition and decrease stimulation.

## 2017-06-06 NOTE — ED Notes (Signed)
No urine has drained from new catheter; will recheck within the hour.

## 2017-06-06 NOTE — Progress Notes (Signed)
Pt seen and examined at bedside, admitted after midnight, please see earlier admission note by Dr. Alcario Drought.   In Brief,  Pt is 84 yo male with known prostate cancer managed non operatively, urinary retention for which he has had chronic foley cath placed for the past 4 years. Pt also with recurrent UTI's. Now presented with fevers, chills, abd discomfort, hematuria.   On admission, pt met criteria for sepsis, started on Cefepime and urine cultures obtained. Per record review, foley has just been changed 02/17 so no need for change at this time.   Blood work reviewed:   BMP Latest Ref Rng & Units 06/06/2017 03/27/2017 08/06/2016  Glucose 65 - 99 mg/dL 104(H) 111(H) 130(H)  BUN 6 - 20 mg/dL 29(H) 29(H) 34(H)  Creatinine 0.61 - 1.24 mg/dL 1.37(H) 1.40(H) 1.70(H)  Sodium 135 - 145 mmol/L 139 142 136  Potassium 3.5 - 5.1 mmol/L 4.3 4.6 4.5  Chloride 101 - 111 mmol/L 107 104 103  CO2 22 - 32 mmol/L 22 - -  Calcium 8.9 - 10.3 mg/dL 8.8(L) - -   CBC Latest Ref Rng & Units 06/06/2017 03/27/2017 08/06/2016  WBC 4.0 - 10.5 K/uL 9.0 - -  Hemoglobin 13.0 - 17.0 g/dL 9.8(L) 11.2(L) 8.8(L)  Hematocrit 39.0 - 52.0 % 30.7(L) 33.0(L) 26.0(L)  Platelets 150 - 400 K/uL 195 - -   Continue same ABX for now, follow up on urine cultures. Family updated at bedside.   Faye Ramsay, MD  [facility] Pager [phone]  If 7PM-7AM, please contact night-coverage [email or web address] Password [email or web address]

## 2017-06-06 NOTE — Care Management Note (Signed)
Case Management Note  Patient Details  Name: Anthony Nicholson MRN: [id number] Date of Birth: 11/01/1917  CM contacted by Lattie Haw with [facility] stating pt is active with [facility] RN and PT.  Pt resides at [facility].  Expected Discharge Date:   Unknown               Expected Discharge Plan:  Assisted Living / Rest Home(with [facility])  Post Acute Care Choice:  Home Health Choice offered to:  Patient  HH Arranged:  RN, PT [place] Agency:  [facility]  Status of Service:  In process, will continue to follow  Rae Mar, RN 06/06/2017, 4:06 PM

## 2017-06-06 NOTE — ED Notes (Signed)
Patient repositioned in bed after patient reported to daughter that he was having significant pain in lower back. Medication given and patient repositioned onto left side.

## 2017-06-06 NOTE — Progress Notes (Signed)
Pharmacy Antibiotic Note  Anthony Nicholson is a 84 y.o. male admitted on 06/06/2017 with sepsis.  Pharmacy has been consulted for cefepime dosing.  Plan: Cefepime 2 gm IV q24h F/u scr/cultures/levels  Height: 5\' 6"  (167.6 cm) Weight: 132 lb (59.9 kg) IBW/kg (Calculated) : 63.8  Temp (24hrs), Avg:103.8 F (39.9 C), Min:103.8 F (39.9 C), Max:103.8 F (39.9 C)  Recent Labs  Lab 06/06/17 0324 06/06/17 0338  WBC 9.0  --   CREATININE 1.37*  --   LATICACIDVEN  --  1.18    Estimated Creatinine Clearance: 26.1 mL/min (A) (by C-G formula based on SCr of 1.37 mg/dL (H)).    No Known Allergies  Antimicrobials this admission: 2/19 rocephin >> x1 ED 2/19 cefepime >>   Dose adjustments this admission:   Microbiology results:  BCx:   UCx:    Sputum:    MRSA PCR:   Thank you for allowing pharmacy to be a part of this patient's care.  Dorrene German 06/06/2017 5:12 AM

## 2017-06-06 NOTE — ED Triage Notes (Signed)
Pt presents from [facility] for evaluation of catheter pain. EMS reports that catheter had been replaced on 06/05/17. Pt febrile at time of triage with a temp of 103.8

## 2017-06-07 ENCOUNTER — Inpatient Hospital Stay (HOSPITAL_COMMUNITY): Payer: Non-veteran care

## 2017-06-07 DIAGNOSIS — C61 Malignant neoplasm of prostate: Secondary | ICD-10-CM

## 2017-06-07 DIAGNOSIS — G9340 Encephalopathy, unspecified: Secondary | ICD-10-CM

## 2017-06-07 DIAGNOSIS — L8990 Pressure ulcer of unspecified site, unspecified stage: Secondary | ICD-10-CM

## 2017-06-07 DIAGNOSIS — N183 Chronic kidney disease, stage 3 (moderate): Secondary | ICD-10-CM

## 2017-06-07 DIAGNOSIS — N179 Acute kidney failure, unspecified: Secondary | ICD-10-CM

## 2017-06-07 DIAGNOSIS — A419 Sepsis, unspecified organism: Secondary | ICD-10-CM

## 2017-06-07 DIAGNOSIS — N3001 Acute cystitis with hematuria: Secondary | ICD-10-CM

## 2017-06-07 LAB — CBC
HCT: 29.7 % — ABNORMAL LOW (ref 39.0–52.0)
Hemoglobin: 9.5 g/dL — ABNORMAL LOW (ref 13.0–17.0)
MCH: 29.2 pg (ref 26.0–34.0)
MCHC: 32 g/dL (ref 30.0–36.0)
MCV: 91.4 fL (ref 78.0–100.0)
Platelets: 149 10*3/uL — ABNORMAL LOW (ref 150–400)
RBC: 3.25 MIL/uL — ABNORMAL LOW (ref 4.22–5.81)
RDW: 14.8 % (ref 11.5–15.5)
WBC: 11.3 10*3/uL — ABNORMAL HIGH (ref 4.0–10.5)

## 2017-06-07 LAB — BASIC METABOLIC PANEL
Anion gap: 8 (ref 5–15)
BUN: 38 mg/dL — AB (ref 6–20)
CO2: 19 mmol/L — ABNORMAL LOW (ref 22–32)
CREATININE: 1.83 mg/dL — AB (ref 0.61–1.24)
Calcium: 7.9 mg/dL — ABNORMAL LOW (ref 8.9–10.3)
Chloride: 113 mmol/L — ABNORMAL HIGH (ref 101–111)
GFR calc Af Amer: 34 mL/min — ABNORMAL LOW (ref >60)
GFR, EST NON AFRICAN AMERICAN: 29 mL/min — AB (ref >60)
GLUCOSE: 64 mg/dL — AB (ref 65–99)
Potassium: 3.7 mmol/L (ref 3.5–5.1)
Sodium: 140 mmol/L (ref 135–145)

## 2017-06-07 LAB — URINE CULTURE

## 2017-06-07 MED ORDER — ENSURE ENLIVE PO LIQD
237.0000 mL | ORAL | Status: DC
  Administered 2017-06-08 – 2017-06-11 (×3): 237 mL via ORAL

## 2017-06-07 MED ORDER — HALOPERIDOL LACTATE 5 MG/ML IJ SOLN
0.5000 mg | Freq: Once | INTRAMUSCULAR | Status: AC
  Administered 2017-06-08: 0.5 mg via INTRAVENOUS
  Filled 2017-06-07: qty 1

## 2017-06-07 MED ORDER — SODIUM CHLORIDE 0.9 % IV SOLN
1.0000 g | Freq: Every day | INTRAVENOUS | Status: DC
  Administered 2017-06-07: 1 g via INTRAVENOUS
  Filled 2017-06-07 (×2): qty 1

## 2017-06-07 NOTE — Progress Notes (Signed)
PT Cancellation Note  Patient Details Name: Nikodem Leadbetter MRN: [id number] DOB: [birth year]   Cancelled Treatment:    Reason Eval/Treat Not Completed: Patient at procedure or test/unavailable(CT) Will check back as schedule permits.   Danyella Mcginty,KATHrine E 06/07/2017, 3:45 PM Carmelia Bake, PT, DPT 06/07/2017 Pager: [phone]

## 2017-06-07 NOTE — Progress Notes (Signed)
Talked with [facility] about patient's history, Dr. Jeffie Pollock wasn't in the office today but I did speak to his nurse. She said their last documentation of a catheter was an 52F Coude Catheter. Talked with the patient's son in law and he said his [facility] nurse usually puts in a 66F Coude Catheter. Right now it looks like the patient has a 66F Straight Tip in.

## 2017-06-07 NOTE — Consult Note (Signed)
Urology Consult   Physician requesting consult: Dr. Florencia Reasons  Reason for consult: Hydronephrosis, acute kidney injury, UTI, Foley catheter trauma  History of Present Illness: Anthony Nicholson is a 84 y.o. followed by Dr. Jeffie Pollock for prostate cancer.  His prostate cancer has been well controlled with androgen deprivation therapy.  He does have chronic urinary retention and an indwelling urethral catheter that is typically changed monthly.  He also has a nursing service that will irrigate his catheter approximately twice weekly.  He apparently developed some pain and discomfort in his lower abdomen and pelvic region on Sunday.  He called the nursing service and they evaluated him.  They ultimately changed out his catheter.  He then developed more severe pain at 1 AM on Monday morning.  He then began developing fevers and presented to the emergency department.  CT imaging was performed that confirmed that the Foley catheter balloon had been inflated in his prostatic urethra.  He had evidence of bilateral hydroureteronephrosis and bladder distention.  His serum creatinine is currently 1.8 which is above his baseline of 1.1.  He is currently on cefepime with cultures pending.  Currently, he is afebrile.  Earlier tonight I was called regarding the CT findings.  I recommended that they remove the indwelling catheter.  This was performed and, as expected, he did have urethral bleeding.  A new catheter was able to be placed by the nursing staff without difficulty.  This drained grossly clear urine and resulted in hemostasis.    Past Medical History:  Diagnosis Date  . Basal cell carcinoma   . HOH (hard of hearing)   . Indwelling urinary catheter present   . Laceration of arm 02/04/2013    Past Surgical History:  Procedure Laterality Date  .  sharpnel removal    1943    . CYSTOSCOPY N/A 10/02/2014   Procedure: FLEXIBLE CYSTOSCOPY;  Surgeon: Irine Seal, MD;  Location: [facility];  Service: Urology;  Laterality:  N/A;  . I&D EXTREMITY Left 02/04/2013   Procedure: IRRIGATION AND DEBRIDEMENT Left Arm Laceration with Repair as Necessary;  Surgeon: Wylene Simmer, MD;  Location: [facility];  Service: Orthopedics;  Laterality: Left;  . INCISION AND DRAINAGE Left 02/04/2013   left arm laceration    Dr Doran Durand  . PROSTATE BIOPSY N/A 10/02/2014   Procedure: PROSTATE ULTRASOUND AND BIOPSY ;  Surgeon: Irine Seal, MD;  Location: [facility];  Service: Urology;  Laterality: N/A;    Current Hospital Medications:  Home Meds:  Current Meds  Medication Sig  . carboxymethylcellulose (REFRESH PLUS) 0.5 % SOLN Place 1 drop into both eyes 3 (three) times daily as needed (dry eye).  . cholecalciferol (VITAMIN D) 1000 units tablet Take 2,000 Units by mouth daily.  . clotrimazole (LOTRIMIN) 1 % cream Apply 1 application topically 2 (two) times daily.  Marland Kitchen ENSURE (ENSURE) Take 237 mLs by mouth daily.  . folic acid (FOLVITE) 1 MG tablet Take 1 mg by mouth daily.  Marland Kitchen levothyroxine (SYNTHROID, LEVOTHROID) 25 MCG tablet Take 25 mcg by mouth daily before breakfast.  . Menthol-Methyl Salicylate (MUSCLE RUB) 10-15 % CREA Apply 1 application topically as needed for muscle pain.  . polymixin-bacitracin (POLYSPORIN) 500-10000 UNIT/GM OINT ointment Apply 1 application topically every other day.  . Skin Protectants, Misc. (EUCERIN) cream Apply 1 application topically as needed for dry skin.  Marland Kitchen vitamin B-12 (CYANOCOBALAMIN) 500 MCG tablet Take 500 mcg by mouth daily.    Scheduled Meds: . feeding supplement (ENSURE ENLIVE)  237 mL  Oral Q24H   Continuous Infusions: . sodium chloride 75 mL/hr at 06/07/17 0913  . ceFEPime (MAXIPIME) IV Stopped (06/07/17 0958)   PRN Meds:.acetaminophen **OR** acetaminophen, lidocaine, ondansetron **OR** ondansetron (ZOFRAN) IV  Allergies: No Known Allergies  Family History  Problem Relation Age of Onset  . Glaucoma Mother   . Stroke Father   . Prostate cancer Neg Hx     Social History:  reports that  has  never smoked. he has never used smokeless tobacco. He reports that he does not drink alcohol or use drugs.  ROS: A complete review of systems was performed.  All systems are negative except for pertinent findings as noted.  Physical Exam:  Vital signs in last 24 hours: Temp:  [98.1 F (36.7 C)] 98.1 F (36.7 C) (02/20 1246) Pulse Rate:  [79-100] 100 (02/20 1246) Resp:  [16-20] 16 (02/20 1246) BP: (112-128)/(40-93) 128/93 (02/20 1246) SpO2:  [100 %] 100 % (02/20 1246) Constitutional:  Alert and oriented, No acute distress Cardiovascular: Regular rate and rhythm, No JVD Respiratory: Normal respiratory effort GI: Abdomen is soft, nontender, nondistended, no abdominal masses GU: No CVA tenderness Lymphatic: No lymphadenopathy Neurologic: Grossly intact, no focal deficits Psychiatric: Normal mood and affect  Laboratory Data:  Recent Labs    06/06/17 0324 06/07/17 0537  WBC 9.0 11.3*  HGB 9.8* 9.5*  HCT 30.7* 29.7*  PLT 195 149*    Recent Labs    06/06/17 0324 06/07/17 0537  NA 139 140  K 4.3 3.7  CL 107 113*  GLUCOSE 104* 64*  BUN 29* 38*  CALCIUM 8.8* 7.9*  CREATININE 1.37* 1.83*     Results for orders placed or performed during the hospital encounter of 06/06/17 (from the past 24 hour(s))  CBC     Status: Abnormal   Collection Time: 06/07/17  5:37 AM  Result Value Ref Range   WBC 11.3 (H) 4.0 - 10.5 K/uL   RBC 3.25 (L) 4.22 - 5.81 MIL/uL   Hemoglobin 9.5 (L) 13.0 - 17.0 g/dL   HCT 29.7 (L) 39.0 - 52.0 %   MCV 91.4 78.0 - 100.0 fL   MCH 29.2 26.0 - 34.0 pg   MCHC 32.0 30.0 - 36.0 g/dL   RDW 14.8 11.5 - 15.5 %   Platelets 149 (L) 150 - 400 K/uL  Basic metabolic panel     Status: Abnormal   Collection Time: 06/07/17  5:37 AM  Result Value Ref Range   Sodium 140 135 - 145 mmol/L   Potassium 3.7 3.5 - 5.1 mmol/L   Chloride 113 (H) 101 - 111 mmol/L   CO2 19 (L) 22 - 32 mmol/L   Glucose, Bld 64 (L) 65 - 99 mg/dL   BUN 38 (H) 6 - 20 mg/dL   Creatinine, Ser  1.83 (H) 0.61 - 1.24 mg/dL   Calcium 7.9 (L) 8.9 - 10.3 mg/dL   GFR calc non Af Amer 29 (L) >60 mL/min   GFR calc Af Amer 34 (L) >60 mL/min   Anion gap 8 5 - 15   Recent Results (from the past 240 hour(s))  Blood Culture (routine x 2)     Status: None (Preliminary result)   Collection Time: 06/06/17  3:24 AM  Result Value Ref Range Status   Specimen Description   Final    BLOOD RIGHT HAND Performed at [facility], [address] [place], [place] [address]    Special Requests   Final    BOTTLES DRAWN AEROBIC AND  ANAEROBIC Blood Culture adequate volume Performed at [facility] [address], [place], [place] [address]    Culture   Final    NO GROWTH 1 DAY Performed at [facility], [place] [address], [place], [place] [address]    Report Status PENDING  Incomplete  Blood Culture (routine x 2)     Status: None (Preliminary result)   Collection Time: 06/06/17  3:52 AM  Result Value Ref Range Status   Specimen Description   Final    BLOOD RIGHT ANTECUBITAL Performed at [facility] [address] [place], [place] [address]    Special Requests   Final    BOTTLES DRAWN AEROBIC AND ANAEROBIC Blood Culture adequate volume Performed at [facility] [address], [place], [place] [address]    Culture   Final    NO GROWTH 1 DAY Performed at [facility], [place] [address] [place], [place] [address]    Report Status PENDING  Incomplete  Urine culture     Status: Abnormal   Collection Time: 06/06/17  6:01 AM  Result Value Ref Range Status   Specimen Description   Final    URINE, CLEAN CATCH Performed at [facility], [place] [address] [place], [place] [address]    Special Requests   Final    NONE Performed at [facility], [place] [address] [place], [place] [address]    Culture MULTIPLE SPECIES PRESENT, SUGGEST RECOLLECTION (A)  Final   Report Status  06/07/2017 FINAL  Final    Renal Function: Recent Labs    06/06/17 0324 06/07/17 0537  CREATININE 1.37* 1.83*   Estimated Creatinine Clearance: 19.5 mL/min (A) (by C-G formula based on SCr of 1.83 mg/dL (H)).  Radiologic Imaging: Dg Chest 2 View  Result Date: 06/06/2017 CLINICAL DATA:  Sepsis and fever EXAM: CHEST  2 VIEW COMPARISON:  12/11/2014 FINDINGS: No consolidation or pleural effusion. Cardiomediastinal silhouette within normal limits. Aortic atherosclerosis. No pneumothorax. Degenerative changes of the spine. IMPRESSION: No active cardiopulmonary disease. Electronically Signed   By: Donavan Foil M.D.   On: 06/06/2017 03:46   US Renal  Result Date: 06/06/2017 CLINICAL DATA:  84 year old with urinary tract infection. EXAM: RENAL / URINARY TRACT ULTRASOUND COMPLETE COMPARISON:  Renal ultrasound 12/09/2014 FINDINGS: Right Kidney: Length: 9.8 cm. Mild hydronephrosis, slightly improved from prior exam. Progressive renal parenchymal thinning. No discrete renal lesion. Left Kidney: Not visualized due to bowel gas. Patient did not tolerate change in positioning. Bladder: Questionable debris in the bladder. IMPRESSION: 1. Mild right hydronephrosis, chronic and slightly improved from prior exam. Progressive renal parenchymal thinning from 2016 ultrasound. 2. Questionable debris in the urinary bladder, in keeping with urinary tract infection. 3. Left kidney not visualized due to bowel gas. Electronically Signed   By: Jeb Levering M.D.   On: 06/06/2017 06:18   Ct Renal Stone Study  Result Date: 06/07/2017 CLINICAL DATA:  Inpatient. Acute right lower quadrant abdominal pain, fever and chills. Hematuria. History of prostate cancer. Urinary retention with chronic indwelling Foley catheter. Recurrent urinary tract infection. EXAM: CT ABDOMEN AND PELVIS WITHOUT CONTRAST TECHNIQUE: Multidetector CT imaging of the abdomen and pelvis was performed following the standard protocol without IV contrast.  COMPARISON:  06/06/2017 renal sonogram. 10/03/2014 CT abdomen/pelvis. FINDINGS: Motion degraded scan, limiting assessment. Lower chest: Small dependent bilateral pleural effusions, right greater than left. Stable calcified dependent left pleural plaque. A few scattered tiny solid pulmonary nodules at the  right lung base, largest 3 mm in peripheral right lower lobe, all stable since 10/03/2014, probably benign. Mild dependent atelectasis at both lung bases. Coronary atherosclerosis. Hepatobiliary: Normal liver size. Stable tiny calcified inferior right liver lobe granuloma. No liver masses. Normal gallbladder with no radiopaque cholelithiasis. No biliary ductal dilatation. Pancreas: Normal, with no mass or duct dilation. Spleen: Normal size. No mass. Adrenals/Urinary Tract: Normal adrenals. Nonobstructing punctate 2 mm lower left renal stone. No additional stones in the kidneys. Chronic bilateral hydroureteronephrosis to the level of the ureterovesical junctions, moderate on the right and mild on the left, not appreciably changed compared to the 10/03/2014 CT study. There are several (at least 5) tiny layering stones in the bladder, largest 3 mm on the left, some of which are at or in close proximity to the ureterovesical junctions bilaterally. No contour deforming renal masses. Prominent diffuse bladder wall thickening and trabeculation with several small bilateral bladder diverticula, largest 1.6 cm in superior right bladder wall, not appreciably changed. There is prominent perivesical fat stranding with ill-defined perivesical fluid. Bladder is mildly distended. Stomach/Bowel: Moderate hiatal hernia is stable. Otherwise normal nondistended stomach. Normal caliber small bowel with no small bowel wall thickening. Normal appendix. Marked colonic diverticulosis, most prominent in the sigmoid colon, with no large bowel wall thickening. Vascular/Lymphatic: Atherosclerotic abdominal aorta with ectatic 2.6 cm infrarenal  abdominal aorta, stable. No pathologically enlarged lymph nodes in the abdomen or pelvis. Reproductive: Malpositioned Foley catheter with the Foley balloon dilated within the bulbar urethra (series 2/image 82) and the Foley tip within the prostatic urethra. Normal size prostate. Other: No pneumoperitoneum. Trace free fluid in the deep pelvis. No measurable fluid collections. Musculoskeletal: No aggressive appearing focal osseous lesions. Chronic mild L1 vertebral compression fracture. Marked thoracolumbar spondylosis. Chronic occult intrasacral meningocele measuring 2.7 cm, not appreciably changed. IMPRESSION: 1. Malpositioned Foley catheter with the Foley balloon dilated within the bulbar urethra. 2. Nonspecific prominent diffuse bladder wall thickening and trabeculation with small bladder diverticula with perivesical fat stranding and ill-defined fluid, worrisome for acute cystitis superimposed on chronic bladder wall thickening due to chronic bladder outlet obstruction. Several tiny layering bladder stones, some located at or in close proximity to the ureterovesical junctions bilaterally. 3. Chronic bilateral hydroureteronephrosis, moderate on the right and mild on the left. Nonobstructing punctate left renal stone. 4. Small dependent bilateral pleural effusions, right greater than left. Calcified pleural plaque on the left. Cannot exclude asbestos related pleural disease. 5. Chronic findings include: Aortic Atherosclerosis (ICD10-I70.0). Stable ectatic 2.6 cm infrarenal abdominal aorta. Coronary atherosclerosis. Marked colonic diverticulosis. Moderate hiatal hernia. These results were called by telephone at the time of interpretation on 06/07/2017 at 4:53 pm to Dr. Florencia Reasons , who verbally acknowledged these results. Electronically Signed   By: Ilona Sorrel M.D.   On: 06/07/2017 16:55    I independently reviewed the above imaging studies.  Impression/Recommendation  1.  Foley catheter trauma with bilateral  hydroureteronephrosis and acute kidney injury: This is likely related to improper placement of his urethral catheter.  This has now been addressed.  I would expect that his renal function will begin to improve.  If so, he can eventually undergo repeat imaging for further evaluation of his hydronephrosis although likely would not very aggressive or invasive evaluation considering his advanced age as long as his renal function is not worsening.  Proper urethral catheter placement is likely sufficient.  2.  UTI: Continue broad-spectrum antibiotic therapy pending urine cultures.  Proper drainage of the urinary tract should  help his infection resolved.  Anthony Nicholson,LES 06/07/2017, 8:39 PM    Pryor Curia MD   CC: Dr. Florencia Reasons

## 2017-06-07 NOTE — Progress Notes (Signed)
After CT Renal Stone study was done it was found that patient's catheter that he came into hospital with was in the wrong place, it was located in the prostate. MD was notified and straight tip foley catheter was removed. Upon removal, 2 RN's noticed that patient was bleeding consistently from insertion site. Hospitalist notified and consulted urology. Urology said to continue to try and place the coude catheter. 2 RN's placed an 69F Coude Catheter, patient tolerated insertion well. No resistence or pain was observed, patient said he didn't hurt from it going in. Urologist Dr. Alinda Money came to see patient before he left for the day to talk with the family about their concerns. Urology will continue to monitor patient as well as hospitalist for any further signs of bleeding or infection. Will continue to monitor patient and update family. No new orders received at this time.  Carmela Hurt, RN

## 2017-06-07 NOTE — Care Management Note (Signed)
Case Management Note  Patient Details  Name: Anthony Nicholson MRN: [id number] Date of Birth: Aug 27, 1917  Subjective/Objective:84 y/o m admitted w/Sepsis. Hx: Prostate Ca. From ALF-[facility]-Active w/[facility] HHC-HHRN-f/c change BIW/HHPT. Dtr mentioned concerns about HHRN-f/c change concerns-I have left vm w/HHC rep Lisa-await response. PT cons-await recc.CSW notified.                   Action/Plan:d/c back to ALF.   Expected Discharge Date:  (unknown)               Expected Discharge Plan:  Assisted Living / Rest Home(with Home Health)  In-House Referral:     Discharge planning Services     Post Acute Care Choice:  Home Health(Active w/[facility] HHRN/HHPT) Choice offered to:  Patient  DME Arranged:    DME Agency:     HH Arranged:    [place] Agency:     Status of Service:  In process, will continue to follow  If discussed at Long Length of Stay Meetings, dates discussed:    Additional Comments:  Dessa Phi, RN 06/07/2017, 1:20 PM

## 2017-06-07 NOTE — Progress Notes (Signed)
Evaluated patients foley catheter with a second RN, patient does not have bump on tube located at end of foley catheter which means his catheter is not a coude and is a 88F straight tip chronic catheter. Palpated patient's abdomen, hard in the lower left quadrant. MD notified and she ordered CT renal study. Will try to get in touch with urology office to determine if catheter is a straight tip for sure. Will continue to monitor patient. No new orders received at this time.

## 2017-06-07 NOTE — Progress Notes (Signed)
Pharmacy Antibiotic Note  Anthony Nicholson is a 84 y.o. male admitted on 06/06/2017 with recurrent UTI related to chronic foley catheter.  Pharmacy has been consulted for Cefepime dosing.  Today, 06/07/2017: SCr up to 1.83 Tm 101.6 WBC 11.3  Plan:  Decrease to Cefepime 1g IV q24h  Follow up renal fxn, culture results, and clinical course.    Height: 5\' 6"  (167.6 cm) Weight: 132 lb (59.9 kg) IBW/kg (Calculated) : 63.8  Temp (24hrs), Avg:99.4 F (37.4 C), Min:97.5 F (36.4 C), Max:101.6 F (38.7 C)  Recent Labs  Lab 06/06/17 0324 06/06/17 0338 06/07/17 0537  WBC 9.0  --  11.3*  CREATININE 1.37*  --  1.83*  LATICACIDVEN  --  1.18  --     Estimated Creatinine Clearance: 19.5 mL/min (A) (by C-G formula based on SCr of 1.83 mg/dL (H)).    No Known Allergies  Antimicrobials this admission: 2/19 rocephin >> x1 ED 2/19 cefepime >>   Dose adjustments this admission:   Microbiology results: 2/19 BCx x2:  2/19 UCx: multiple species  Thank you for allowing pharmacy to be a part of this patient's care.  Gretta Arab PharmD, BCPS Pager [phone] 06/07/2017 8:28 AM

## 2017-06-07 NOTE — Progress Notes (Signed)
PROGRESS NOTE  Anthony Nicholson XAJ:[id number] DOB: 25-Mar-1918 DOA: 06/06/2017 PCP: [facility]  HPI/Recap of past 24 hours:  C/o abdominal pain  Assessment/Plan: Principal Problem:   Sepsis ([place]) Active Problems:   Urinary retention   Adenocarcinoma of prostate ([place])   Lower urinary tract infectious disease   Hematuria   Acute encephalopathy   CKD (chronic kidney disease) stage 3, GFR 30-59 ml/min (HCC)   Sepsis presented on presentation with fever 103.8, tachycardia heart rate 113 , tachypnea respiration rate 24-26 , hypotension systolic blood pressure in the 90s Presumed source from urine with chronic indwelling Foley catheter, last changed on Monday Due to complaining of abdominal pain, CT renal stone obtained, showed Foley malpositioning Remove foley, replace new one Continue antibiotics Urology consulted  Acute metabolic encephalopathy presented on admission He is not oriented to time Expect improvement Daughter report patient previously lives by himself   AK I on CKD 3: Likely from mild position of Foley catheter CT renal stone showed stable bilateral hydronephrosis Foley repositioned Repeat lab in a.m. Renal dosing meds  Anemia of chronic disease Hemoglobin stable at baseline  History of prostate cancer status post hormonal therapy  Code Status: DNR  Family Communication: patient and daughter at bedside  Disposition Plan: pending   Consultants:  Case discussed with urology Dr Alinda Money  Procedures:  Foley replacement on 2/20 ( old foley removal,  New coude 45fr inserted on 2/20)   Antibiotics:  cefepime   Objective: BP (!) 128/93 (BP Location: Left Arm)   Pulse 100   Temp 98.1 F (36.7 C) (Oral)   Resp 16   Ht 5\' 6"  (1.676 m)   Wt 59.9 kg (132 lb)   SpO2 100%   BMI 21.31 kg/m   Intake/Output Summary (Last 24 hours) at 06/07/2017 1712 Last data filed at 06/07/2017 1400 Gross per 24 hour  Intake 2292.5 ml  Output 600 ml  Net  1692.5 ml   Filed Weights   06/06/17 0352  Weight: 59.9 kg (132 lb)    Exam: Patient is examined daily including today on 06/07/2017, exams remain the same as of yesterday except that has changed    General:  NAD, pleasant, not oriented to time but to place and person  Cardiovascular: RRR  Respiratory: CTABL  Abdomen: Soft/ND/NT, positive BS  Musculoskeletal: No Edema  Neuro: alert, interactive  Data Reviewed: Basic Metabolic Panel: Recent Labs  Lab 06/06/17 0324 06/07/17 0537  NA 139 140  K 4.3 3.7  CL 107 113*  CO2 22 19*  GLUCOSE 104* 64*  BUN 29* 38*  CREATININE 1.37* 1.83*  CALCIUM 8.8* 7.9*   Liver Function Tests: Recent Labs  Lab 06/06/17 0324  AST 20  ALT 11*  ALKPHOS 66  BILITOT 0.7  PROT 6.3*  ALBUMIN 3.4*   No results for input(s): LIPASE, AMYLASE in the last 168 hours. No results for input(s): AMMONIA in the last 168 hours. CBC: Recent Labs  Lab 06/06/17 0324 06/07/17 0537  WBC 9.0 11.3*  NEUTROABS 8.7*  --   HGB 9.8* 9.5*  HCT 30.7* 29.7*  MCV 90.3 91.4  PLT 195 149*   Cardiac Enzymes:   No results for input(s): CKTOTAL, CKMB, CKMBINDEX, TROPONINI in the last 168 hours. BNP (last 3 results) No results for input(s): BNP in the last 8760 hours.  ProBNP (last 3 results) No results for input(s): PROBNP in the last 8760 hours.  CBG: No results for input(s): GLUCAP in the last 168 hours.  Recent  Results (from the past 240 hour(s))  Blood Culture (routine x 2)     Status: None (Preliminary result)   Collection Time: 06/06/17  3:24 AM  Result Value Ref Range Status   Specimen Description   Final    BLOOD RIGHT HAND Performed at [facility] [address] [place], [place] [address]    Special Requests   Final    BOTTLES DRAWN AEROBIC AND ANAEROBIC Blood Culture adequate volume Performed at [facility] [address], [place], [place] [address]    Culture   Final    NO GROWTH 1  DAY Performed at [facility], [place] [address] [place], [place] [address]    Report Status PENDING  Incomplete  Blood Culture (routine x 2)     Status: None (Preliminary result)   Collection Time: 06/06/17  3:52 AM  Result Value Ref Range Status   Specimen Description   Final    BLOOD RIGHT ANTECUBITAL Performed at [facility] [address] [place], [place] [address]    Special Requests   Final    BOTTLES DRAWN AEROBIC AND ANAEROBIC Blood Culture adequate volume Performed at [facility] [address] [place], [place] [address]    Culture   Final    NO GROWTH 1 DAY Performed at [facility], [place] [address] [place], [place] [address]    Report Status PENDING  Incomplete  Urine culture     Status: Abnormal   Collection Time: 06/06/17  6:01 AM  Result Value Ref Range Status   Specimen Description   Final    URINE, CLEAN CATCH Performed at [facility], [place] [address], [place], [place] [address]    Special Requests   Final    NONE Performed at [facility], [place] [address] [place], [place] [address]    Culture MULTIPLE SPECIES PRESENT, SUGGEST RECOLLECTION (A)  Final   Report Status 06/07/2017 FINAL  Final     Studies: Ct Renal Stone Study  Result Date: 06/07/2017 CLINICAL DATA:  Inpatient. Acute right lower quadrant abdominal pain, fever and chills. Hematuria. History of prostate cancer. Urinary retention with chronic indwelling Foley catheter. Recurrent urinary tract infection. EXAM: CT ABDOMEN AND PELVIS WITHOUT CONTRAST TECHNIQUE: Multidetector CT imaging of the abdomen and pelvis was performed following the standard protocol without IV contrast. COMPARISON:  06/06/2017 renal sonogram. 10/03/2014 CT abdomen/pelvis. FINDINGS: Motion degraded scan, limiting assessment. Lower chest: Small dependent bilateral pleural effusions, right greater than left. Stable calcified dependent left  pleural plaque. A few scattered tiny solid pulmonary nodules at the right lung base, largest 3 mm in peripheral right lower lobe, all stable since 10/03/2014, probably benign. Mild dependent atelectasis at both lung bases. Coronary atherosclerosis. Hepatobiliary: Normal liver size. Stable tiny calcified inferior right liver lobe granuloma. No liver masses. Normal gallbladder with no radiopaque cholelithiasis. No biliary ductal dilatation. Pancreas: Normal, with no mass or duct dilation. Spleen: Normal size. No mass. Adrenals/Urinary Tract: Normal adrenals. Nonobstructing punctate 2 mm lower left renal stone. No additional stones in the kidneys. Chronic bilateral hydroureteronephrosis to the level of the ureterovesical junctions, moderate on the right and mild on the left, not appreciably changed compared to the 10/03/2014 CT study. There are several (at least 5) tiny layering stones in the bladder, largest 3 mm on the left, some of which are at or in close proximity to the ureterovesical junctions bilaterally. No contour deforming renal masses.  Prominent diffuse bladder wall thickening and trabeculation with several small bilateral bladder diverticula, largest 1.6 cm in superior right bladder wall, not appreciably changed. There is prominent perivesical fat stranding with ill-defined perivesical fluid. Bladder is mildly distended. Stomach/Bowel: Moderate hiatal hernia is stable. Otherwise normal nondistended stomach. Normal caliber small bowel with no small bowel wall thickening. Normal appendix. Marked colonic diverticulosis, most prominent in the sigmoid colon, with no large bowel wall thickening. Vascular/Lymphatic: Atherosclerotic abdominal aorta with ectatic 2.6 cm infrarenal abdominal aorta, stable. No pathologically enlarged lymph nodes in the abdomen or pelvis. Reproductive: Malpositioned Foley catheter with the Foley balloon dilated within the bulbar urethra (series 2/image 82) and the Foley tip within the  prostatic urethra. Normal size prostate. Other: No pneumoperitoneum. Trace free fluid in the deep pelvis. No measurable fluid collections. Musculoskeletal: No aggressive appearing focal osseous lesions. Chronic mild L1 vertebral compression fracture. Marked thoracolumbar spondylosis. Chronic occult intrasacral meningocele measuring 2.7 cm, not appreciably changed. IMPRESSION: 1. Malpositioned Foley catheter with the Foley balloon dilated within the bulbar urethra. 2. Nonspecific prominent diffuse bladder wall thickening and trabeculation with small bladder diverticula with perivesical fat stranding and ill-defined fluid, worrisome for acute cystitis superimposed on chronic bladder wall thickening due to chronic bladder outlet obstruction. Several tiny layering bladder stones, some located at or in close proximity to the ureterovesical junctions bilaterally. 3. Chronic bilateral hydroureteronephrosis, moderate on the right and mild on the left. Nonobstructing punctate left renal stone. 4. Small dependent bilateral pleural effusions, right greater than left. Calcified pleural plaque on the left. Cannot exclude asbestos related pleural disease. 5. Chronic findings include: Aortic Atherosclerosis (ICD10-I70.0). Stable ectatic 2.6 cm infrarenal abdominal aorta. Coronary atherosclerosis. Marked colonic diverticulosis. Moderate hiatal hernia. These results were called by telephone at the time of interpretation on 06/07/2017 at 4:53 pm to Dr. Florencia Reasons , who verbally acknowledged these results. Electronically Signed   By: Ilona Sorrel M.D.   On: 06/07/2017 16:55    Scheduled Meds: . feeding supplement (ENSURE ENLIVE)  237 mL Oral Q24H    Continuous Infusions: . sodium chloride 75 mL/hr at 06/07/17 0913  . ceFEPime (MAXIPIME) IV Stopped (06/07/17 5284)     Time spent: 35 mins I have personally reviewed and interpreted on  06/07/2017 daily labs, tele strips, imagings as discussed above under date review session and  assessment and plans.  I reviewed all nursing notes, pharmacy notes, consultant notes,  vitals, pertinent old records  I have discussed plan of care as described above with RN , patient and family on 06/07/2017   Florencia Reasons MD, PhD  [facility] Pager [phone]. If 7PM-7AM, please contact night-coverage at [email or web address], password [facility] 06/07/2017, 5:12 PM  LOS: 1 day

## 2017-06-07 NOTE — Plan of Care (Signed)
  Clinical Measurements: Ability to maintain clinical measurements within normal limits will improve 06/07/2017 2219 - Progressing by Ashley Murrain, RN   Safety: Ability to remain free from injury will improve 06/07/2017 2219 - Progressing by [place] Grindstaff, Sherryll Burger, RN

## 2017-06-07 NOTE — Progress Notes (Addendum)
Initial Nutrition Assessment  DOCUMENTATION CODES:   Not applicable  INTERVENTION:   Snacks BID  Ensure Enlive (chocolate) po daily, each supplement provides 350 kcal and 20 grams of protein  Recommend liberalizing pt's diet to promote PO intake  NUTRITION DIAGNOSIS:   Increased nutrient needs related to acute illness, chronic illness(prostate cancer) as evidenced by estimated needs.  GOAL:   Patient will meet greater than or equal to 90% of their needs  MONITOR:   PO intake, Supplement acceptance, Labs, Weight trends, I & O's, Skin  REASON FOR ASSESSMENT:   Malnutrition Screening Tool    ASSESSMENT:   Pt with PMH of prostate cancer managed non-operatively with a chronic foley catheter resulting in frequent UTIs presents with sepsis likely r/t UTI    Spoke with pt and pt's daughter at bedside. Pt lives alone and has mobile meals delivered and two daily aids come in to help him. Pt's daughter reports he has "never been a great eater" They try to get pt to consume at least 1 Ensure per day. Meal completion records show pt is consuming between 10-25% of meals at this time. RD to order nutritional supplement and snacks to promote intake.  Pt's daughter reports no weight changes for pt. Per chart review no recent or significant changes documented.  Pt utilizes a walker at baseline but is active.   Labs and medications reviewed; BUN 38, Albumin 3.4, Hemoglobin 9.5   NUTRITION - FOCUSED PHYSICAL EXAM:    Most Recent Value  Orbital Region  No depletion  Upper Arm Region  No depletion  Thoracic and Lumbar Region  No depletion  Buccal Region  Mild depletion  Temple Region  Moderate depletion  Clavicle Bone Region  Moderate depletion  Clavicle and Acromion Bone Region  Moderate depletion  Scapular Bone Region  Mild depletion  Dorsal Hand  Mild depletion  Patellar Region  Moderate depletion  Anterior Thigh Region  Moderate depletion  Posterior Calf Region  Moderate  depletion  Edema (RD Assessment)  None     Diet Order:  Diet Heart Room service appropriate? Yes; Fluid consistency: Thin  EDUCATION NEEDS:   Not appropriate for education at this time  Skin:  Skin Assessment: Skin Integrity Issues: Skin Integrity Issues:: Stage II Stage II: sacrum  Last BM:  06/07/17  Height:   Ht Readings from Last 1 Encounters:  06/06/17 5\' 6"  (1.676 m)   Weight:   Wt Readings from Last 1 Encounters:  06/06/17 132 lb (59.9 kg)   Ideal Body Weight:  64.5 kg  BMI:  Body mass index is 21.31 kg/m.  Estimated Nutritional Needs:   Kcal:  1500-1700  Protein:  70-80 grams  Fluid:  >/= 1.5 L/d  [place] Ranger, MS, RDN, LDN 06/07/2017 1:35 PM

## 2017-06-08 LAB — CBC WITH DIFFERENTIAL/PLATELET
BASOS ABS: 0 10*3/uL (ref 0.0–0.1)
Basophils Relative: 0 %
Eosinophils Absolute: 0.1 10*3/uL (ref 0.0–0.7)
Eosinophils Relative: 1 %
HEMATOCRIT: 25.6 % — AB (ref 39.0–52.0)
HEMOGLOBIN: 8.4 g/dL — AB (ref 13.0–17.0)
Lymphocytes Relative: 7 %
Lymphs Abs: 0.6 10*3/uL — ABNORMAL LOW (ref 0.7–4.0)
MCH: 29.6 pg (ref 26.0–34.0)
MCHC: 32.8 g/dL (ref 30.0–36.0)
MCV: 90.1 fL (ref 78.0–100.0)
Monocytes Absolute: 0.3 10*3/uL (ref 0.1–1.0)
Monocytes Relative: 3 %
NEUTROS ABS: 7.5 10*3/uL (ref 1.7–7.7)
Neutrophils Relative %: 89 %
Platelets: 148 10*3/uL — ABNORMAL LOW (ref 150–400)
RBC: 2.84 MIL/uL — AB (ref 4.22–5.81)
RDW: 14.7 % (ref 11.5–15.5)
WBC: 8.5 10*3/uL (ref 4.0–10.5)

## 2017-06-08 LAB — BASIC METABOLIC PANEL
ANION GAP: 8 (ref 5–15)
BUN: 44 mg/dL — ABNORMAL HIGH (ref 6–20)
CO2: 18 mmol/L — AB (ref 22–32)
Calcium: 7.8 mg/dL — ABNORMAL LOW (ref 8.9–10.3)
Chloride: 113 mmol/L — ABNORMAL HIGH (ref 101–111)
Creatinine, Ser: 1.61 mg/dL — ABNORMAL HIGH (ref 0.61–1.24)
GFR calc Af Amer: 40 mL/min — ABNORMAL LOW (ref >60)
GFR calc non Af Amer: 34 mL/min — ABNORMAL LOW (ref >60)
GLUCOSE: 90 mg/dL (ref 65–99)
POTASSIUM: 3.7 mmol/L (ref 3.5–5.1)
Sodium: 139 mmol/L (ref 135–145)

## 2017-06-08 LAB — PROTIME-INR
INR: 1.16
Prothrombin Time: 14.7 seconds (ref 11.4–15.2)

## 2017-06-08 MED ORDER — CEFDINIR 300 MG PO CAPS
300.0000 mg | ORAL_CAPSULE | Freq: Every day | ORAL | Status: DC
  Filled 2017-06-08: qty 1

## 2017-06-08 MED ORDER — CEFDINIR 125 MG/5ML PO SUSR
300.0000 mg | Freq: Every day | ORAL | Status: DC
  Administered 2017-06-08 – 2017-06-12 (×5): 300 mg via ORAL
  Filled 2017-06-08 (×5): qty 12

## 2017-06-08 MED ORDER — LEVOTHYROXINE SODIUM 25 MCG PO TABS
25.0000 ug | ORAL_TABLET | Freq: Every day | ORAL | Status: DC
  Administered 2017-06-08 – 2017-06-12 (×5): 25 ug via ORAL
  Filled 2017-06-08 (×5): qty 1

## 2017-06-08 MED ORDER — QUETIAPINE FUMARATE 25 MG PO TABS
25.0000 mg | ORAL_TABLET | Freq: Every day | ORAL | Status: DC
  Administered 2017-06-08: 25 mg via ORAL
  Filled 2017-06-08: qty 1

## 2017-06-08 NOTE — Progress Notes (Signed)
Patient ID: Anthony Nicholson, male   DOB: 03/13/1918, 84 y.o.   MRN: [id number]    Subjective: Pt doing well this morning.  No complaints.  Objective: Vital signs in last 24 hours: Temp:  [98.1 F (36.7 C)-99.3 F (37.4 C)] 99.3 F (37.4 C) (02/21 0645) Pulse Rate:  [95-100] 100 (02/21 0645) Resp:  [16-20] 20 (02/21 0645) BP: (122-131)/(55-93) 131/61 (02/21 0645) SpO2:  [95 %-100 %] 95 % (02/21 0645)  Intake/Output from previous day: 02/20 0701 - 02/21 0700 In: 3381.3 [P.O.:600; I.V.:2681.3; IV Piggyback:100] Out: 1025 [Urine:1025] Intake/Output this shift: Total I/O In: 120 [P.O.:120] Out: 725 [Urine:725]  Physical Exam:  General: Alert and oriented GU: Urine clear  Lab Results: Recent Labs    06/06/17 0324 06/07/17 0537 06/08/17 0520  HGB 9.8* 9.5* 8.4*  HCT 30.7* 29.7* 25.6*   BMET Recent Labs    06/07/17 0537 06/08/17 0520  NA 140 139  K 3.7 3.7  CL 113* 113*  CO2 19* 18*  GLUCOSE 64* 90  BUN 38* 44*  CREATININE 1.83* 1.61*  CALCIUM 7.9* 7.8*     Studies/Results: Ct Renal Stone Study  Result Date: 06/07/2017 CLINICAL DATA:  Inpatient. Acute right lower quadrant abdominal pain, fever and chills. Hematuria. History of prostate cancer. Urinary retention with chronic indwelling Foley catheter. Recurrent urinary tract infection. EXAM: CT ABDOMEN AND PELVIS WITHOUT CONTRAST TECHNIQUE: Multidetector CT imaging of the abdomen and pelvis was performed following the standard protocol without IV contrast. COMPARISON:  06/06/2017 renal sonogram. 10/03/2014 CT abdomen/pelvis. FINDINGS: Motion degraded scan, limiting assessment. Lower chest: Small dependent bilateral pleural effusions, right greater than left. Stable calcified dependent left pleural plaque. A few scattered tiny solid pulmonary nodules at the right lung base, largest 3 mm in peripheral right lower lobe, all stable since 10/03/2014, probably benign. Mild dependent atelectasis at both lung bases. Coronary  atherosclerosis. Hepatobiliary: Normal liver size. Stable tiny calcified inferior right liver lobe granuloma. No liver masses. Normal gallbladder with no radiopaque cholelithiasis. No biliary ductal dilatation. Pancreas: Normal, with no mass or duct dilation. Spleen: Normal size. No mass. Adrenals/Urinary Tract: Normal adrenals. Nonobstructing punctate 2 mm lower left renal stone. No additional stones in the kidneys. Chronic bilateral hydroureteronephrosis to the level of the ureterovesical junctions, moderate on the right and mild on the left, not appreciably changed compared to the 10/03/2014 CT study. There are several (at least 5) tiny layering stones in the bladder, largest 3 mm on the left, some of which are at or in close proximity to the ureterovesical junctions bilaterally. No contour deforming renal masses. Prominent diffuse bladder wall thickening and trabeculation with several small bilateral bladder diverticula, largest 1.6 cm in superior right bladder wall, not appreciably changed. There is prominent perivesical fat stranding with ill-defined perivesical fluid. Bladder is mildly distended. Stomach/Bowel: Moderate hiatal hernia is stable. Otherwise normal nondistended stomach. Normal caliber small bowel with no small bowel wall thickening. Normal appendix. Marked colonic diverticulosis, most prominent in the sigmoid colon, with no large bowel wall thickening. Vascular/Lymphatic: Atherosclerotic abdominal aorta with ectatic 2.6 cm infrarenal abdominal aorta, stable. No pathologically enlarged lymph nodes in the abdomen or pelvis. Reproductive: Malpositioned Foley catheter with the Foley balloon dilated within the bulbar urethra (series 2/image 82) and the Foley tip within the prostatic urethra. Normal size prostate. Other: No pneumoperitoneum. Trace free fluid in the deep pelvis. No measurable fluid collections. Musculoskeletal: No aggressive appearing focal osseous lesions. Chronic mild L1 vertebral  compression fracture. Marked thoracolumbar spondylosis. Chronic occult intrasacral meningocele  measuring 2.7 cm, not appreciably changed. IMPRESSION: 1. Malpositioned Foley catheter with the Foley balloon dilated within the bulbar urethra. 2. Nonspecific prominent diffuse bladder wall thickening and trabeculation with small bladder diverticula with perivesical fat stranding and ill-defined fluid, worrisome for acute cystitis superimposed on chronic bladder wall thickening due to chronic bladder outlet obstruction. Several tiny layering bladder stones, some located at or in close proximity to the ureterovesical junctions bilaterally. 3. Chronic bilateral hydroureteronephrosis, moderate on the right and mild on the left. Nonobstructing punctate left renal stone. 4. Small dependent bilateral pleural effusions, right greater than left. Calcified pleural plaque on the left. Cannot exclude asbestos related pleural disease. 5. Chronic findings include: Aortic Atherosclerosis (ICD10-I70.0). Stable ectatic 2.6 cm infrarenal abdominal aorta. Coronary atherosclerosis. Marked colonic diverticulosis. Moderate hiatal hernia. These results were called by telephone at the time of interpretation on 06/07/2017 at 4:53 pm to Dr. Florencia Reasons , who verbally acknowledged these results. Electronically Signed   By: Ilona Sorrel M.D.   On: 06/07/2017 16:55    Assessment/Plan: 1) AKI/hydronephrosis/BOO: Renal function improving with new catheter indwelling.  Continue catheter drainage.  Can consider repeating ultrasound to examine hydronephrosis in 48-72 hours or as outpatient. 2) UTI: Multiple species in urine culture.  Blood cultures pending.  Continue empiric therapy.   LOS: 2 days   Leshia Kope,LES 06/08/2017, 12:32 PM

## 2017-06-08 NOTE — Plan of Care (Signed)
Patient stable during 7 a to 7 p shift, VSS, no complaints.  Patient up to chair with one standby assist after initially getting up and walking with physical therapy.  Daughter and son in law at bedside.  They feel it is in patients best interest to return back to his independent living with the level of assistance he had prior to this admission.  Family will discuss with Education officer, museum and MD tomorrow.  Urine has remained pale yellow with good urinary output this shift.

## 2017-06-08 NOTE — Progress Notes (Addendum)
PROGRESS NOTE  Anthony Nicholson ZOX:[id number] DOB: 16-Oct-1917 DOA: 06/06/2017 PCP: [facility]  HPI/Recap of past 24 hours:  Has a new Foley catheter placed draining clear urine Denies abdominal pain No fever Daughter at bedside  Patient complains of difficulty swallowing, RN did not observe any difficulty swallowing pills, speech eval pending  Assessment/Plan: Principal Problem:   Sepsis ([place]) Active Problems:   Urinary retention   Adenocarcinoma of prostate ([place])   Lower urinary tract infectious disease   Hematuria   Acute encephalopathy   CKD (chronic kidney disease) stage 3, GFR 30-59 ml/min (HCC)   Pressure injury of skin   Sepsis presented on presentation with fever 103.8, tachycardia heart rate 113 , tachypnea respiration rate 24-26 , hypotension systolic blood pressure in the 90s Presumed source from urine with chronic indwelling Foley catheter, last changed on Monday Due to complaining of abdominal pain, CT renal stone obtained, showed Foley malpositioning Remove foley, replaced new one on 2/20 Continue antibiotics, blood culture no growth, urine culture with multiple species Urology consulted, input appreciated  Acute metabolic encephalopathy presented on admission  Improved, now at baseline He is not oriented to year, per daughter this is at baseline . Patient has some sundowning symptoms in the evening Daughter report patient previously lives by himself   AK I on CKD 3:  Likely from mild position of Foley catheter CT renal stone showed stable bilateral hydronephrosis Foley repositioned Repeat lab in a.m. creatinine improved, repeat ultrasound in 24-48 hours per urology recommendation Renal dosing meds  Anemia of chronic disease Had bled after removal of old Foley, bleeding has stopped Hemoglobin 8.4 today, repeat in a.m.  History of hypothyroidism, continue Synthroid  History of prostate cancer status post hormonal therapy  Code Status:  DNR  Family Communication: patient and daughter at bedside  Disposition Plan: SNF placement, hopefully on Friday or Saturday pending bedavailability   Consultants:  urology Dr Alinda Money  Procedures:  Foley replacement on 2/20 ( old foley removal,  New coude 110fr inserted on 2/20)   Antibiotics:  Cefepime from admission to 2/21  Omnicef from 2/21   Objective: BP 131/61 (BP Location: Right Arm)   Pulse 100   Temp 99.3 F (37.4 C) (Oral)   Resp 20   Ht 5\' 6"  (1.676 m)   Wt 59.9 kg (132 lb)   SpO2 95%   BMI 21.31 kg/m   Intake/Output Summary (Last 24 hours) at 06/08/2017 1113 Last data filed at 06/08/2017 0900 Gross per 24 hour  Intake 2156.25 ml  Output 1300 ml  Net 856.25 ml   Filed Weights   06/06/17 0352  Weight: 59.9 kg (132 lb)    Exam: Patient is examined daily including today on 06/08/2017, exams remain the same as of yesterday except that has changed    General:  NAD, pleasant, not oriented to time but to place and person  Cardiovascular: RRR  Respiratory: CTABL  Abdomen: Soft/ND/NT, positive BS  Musculoskeletal: No Edema  Neuro: alert, interactive  Data Reviewed: Basic Metabolic Panel: Recent Labs  Lab 06/06/17 0324 06/07/17 0537 06/08/17 0520  NA 139 140 139  K 4.3 3.7 3.7  CL 107 113* 113*  CO2 22 19* 18*  GLUCOSE 104* 64* 90  BUN 29* 38* 44*  CREATININE 1.37* 1.83* 1.61*  CALCIUM 8.8* 7.9* 7.8*   Liver Function Tests: Recent Labs  Lab 06/06/17 0324  AST 20  ALT 11*  ALKPHOS 66  BILITOT 0.7  PROT 6.3*  ALBUMIN 3.4*  No results for input(s): LIPASE, AMYLASE in the last 168 hours. No results for input(s): AMMONIA in the last 168 hours. CBC: Recent Labs  Lab 06/06/17 0324 06/07/17 0537 06/08/17 0520  WBC 9.0 11.3* 8.5  NEUTROABS 8.7*  --  7.5  HGB 9.8* 9.5* 8.4*  HCT 30.7* 29.7* 25.6*  MCV 90.3 91.4 90.1  PLT 195 149* 148*   Cardiac Enzymes:   No results for input(s): CKTOTAL, CKMB, CKMBINDEX, TROPONINI in the  last 168 hours. BNP (last 3 results) No results for input(s): BNP in the last 8760 hours.  ProBNP (last 3 results) No results for input(s): PROBNP in the last 8760 hours.  CBG: No results for input(s): GLUCAP in the last 168 hours.  Recent Results (from the past 240 hour(s))  Blood Culture (routine x 2)     Status: None (Preliminary result)   Collection Time: 06/06/17  3:24 AM  Result Value Ref Range Status   Specimen Description   Final    BLOOD RIGHT HAND Performed at [facility] [address], [place], [place] [address]    Special Requests   Final    BOTTLES DRAWN AEROBIC AND ANAEROBIC Blood Culture adequate volume Performed at [facility] [address], [place], [place] [address]    Culture   Final    NO GROWTH 1 DAY Performed at [facility], [place] [address], [place], [place] [address]    Report Status PENDING  Incomplete  Blood Culture (routine x 2)     Status: None (Preliminary result)   Collection Time: 06/06/17  3:52 AM  Result Value Ref Range Status   Specimen Description   Final    BLOOD RIGHT ANTECUBITAL Performed at [facility] [address] [place], [place] [address]    Special Requests   Final    BOTTLES DRAWN AEROBIC AND ANAEROBIC Blood Culture adequate volume Performed at [facility] [address] [place], [place] [address]    Culture   Final    NO GROWTH 1 DAY Performed at [facility], [place] [address], [place], [place] [address]    Report Status PENDING  Incomplete  Urine culture     Status: Abnormal   Collection Time: 06/06/17  6:01 AM  Result Value Ref Range Status   Specimen Description   Final    URINE, CLEAN CATCH Performed at [facility], [place] [address], [place], [place] [address]    Special Requests   Final    NONE Performed at [facility], [place] [address] [place],  [address]    Culture  MULTIPLE SPECIES PRESENT, SUGGEST RECOLLECTION (A)  Final   Report Status 06/07/2017 FINAL  Final     Studies: Ct Renal Stone Study  Result Date: 06/07/2017 CLINICAL DATA:  Inpatient. Acute right lower quadrant abdominal pain, fever and chills. Hematuria. History of prostate cancer. Urinary retention with chronic indwelling Foley catheter. Recurrent urinary tract infection. EXAM: CT ABDOMEN AND PELVIS WITHOUT CONTRAST TECHNIQUE: Multidetector CT imaging of the abdomen and pelvis was performed following the standard protocol without IV contrast. COMPARISON:  06/06/2017 renal sonogram. 10/03/2014 CT abdomen/pelvis. FINDINGS: Motion degraded scan, limiting assessment. Lower chest: Small dependent bilateral pleural effusions, right greater than left. Stable calcified dependent left pleural plaque. A few scattered tiny solid pulmonary nodules at the right lung base, largest 3 mm in peripheral right lower lobe, all stable since 10/03/2014, probably benign. Mild dependent atelectasis at  both lung bases. Coronary atherosclerosis. Hepatobiliary: Normal liver size. Stable tiny calcified inferior right liver lobe granuloma. No liver masses. Normal gallbladder with no radiopaque cholelithiasis. No biliary ductal dilatation. Pancreas: Normal, with no mass or duct dilation. Spleen: Normal size. No mass. Adrenals/Urinary Tract: Normal adrenals. Nonobstructing punctate 2 mm lower left renal stone. No additional stones in the kidneys. Chronic bilateral hydroureteronephrosis to the level of the ureterovesical junctions, moderate on the right and mild on the left, not appreciably changed compared to the 10/03/2014 CT study. There are several (at least 5) tiny layering stones in the bladder, largest 3 mm on the left, some of which are at or in close proximity to the ureterovesical junctions bilaterally. No contour deforming renal masses. Prominent diffuse bladder wall thickening and trabeculation with several small bilateral  bladder diverticula, largest 1.6 cm in superior right bladder wall, not appreciably changed. There is prominent perivesical fat stranding with ill-defined perivesical fluid. Bladder is mildly distended. Stomach/Bowel: Moderate hiatal hernia is stable. Otherwise normal nondistended stomach. Normal caliber small bowel with no small bowel wall thickening. Normal appendix. Marked colonic diverticulosis, most prominent in the sigmoid colon, with no large bowel wall thickening. Vascular/Lymphatic: Atherosclerotic abdominal aorta with ectatic 2.6 cm infrarenal abdominal aorta, stable. No pathologically enlarged lymph nodes in the abdomen or pelvis. Reproductive: Malpositioned Foley catheter with the Foley balloon dilated within the bulbar urethra (series 2/image 82) and the Foley tip within the prostatic urethra. Normal size prostate. Other: No pneumoperitoneum. Trace free fluid in the deep pelvis. No measurable fluid collections. Musculoskeletal: No aggressive appearing focal osseous lesions. Chronic mild L1 vertebral compression fracture. Marked thoracolumbar spondylosis. Chronic occult intrasacral meningocele measuring 2.7 cm, not appreciably changed. IMPRESSION: 1. Malpositioned Foley catheter with the Foley balloon dilated within the bulbar urethra. 2. Nonspecific prominent diffuse bladder wall thickening and trabeculation with small bladder diverticula with perivesical fat stranding and ill-defined fluid, worrisome for acute cystitis superimposed on chronic bladder wall thickening due to chronic bladder outlet obstruction. Several tiny layering bladder stones, some located at or in close proximity to the ureterovesical junctions bilaterally. 3. Chronic bilateral hydroureteronephrosis, moderate on the right and mild on the left. Nonobstructing punctate left renal stone. 4. Small dependent bilateral pleural effusions, right greater than left. Calcified pleural plaque on the left. Cannot exclude asbestos related pleural  disease. 5. Chronic findings include: Aortic Atherosclerosis (ICD10-I70.0). Stable ectatic 2.6 cm infrarenal abdominal aorta. Coronary atherosclerosis. Marked colonic diverticulosis. Moderate hiatal hernia. These results were called by telephone at the time of interpretation on 06/07/2017 at 4:53 pm to Dr. Florencia Reasons , who verbally acknowledged these results. Electronically Signed   By: Ilona Sorrel M.D.   On: 06/07/2017 16:55    Scheduled Meds: . cefdinir  300 mg Oral Daily  . feeding supplement (ENSURE ENLIVE)  237 mL Oral Q24H  . levothyroxine  25 mcg Oral QAC breakfast    Continuous Infusions: . sodium chloride 75 mL/hr at 06/08/17 0204     Time spent: 25 mins I have personally reviewed and interpreted on  06/08/2017 daily labs, tele strips, imagings as discussed above under date review session and assessment and plans.  I reviewed all nursing notes, pharmacy notes, consultant notes,  vitals, pertinent old records  I have discussed plan of care as described above with RN , patient and family on 06/08/2017   Florencia Reasons MD, PhD  [facility] Pager [phone]. If 7PM-7AM, please contact night-coverage at [email or web address], password [facility] 06/08/2017, 11:13 AM  LOS: 2 days

## 2017-06-08 NOTE — Evaluation (Addendum)
Physical Therapy Evaluation Patient Details Name: Anthony Nicholson MRN: [id number] DOB: 08/13/1917 Today's Date: 06/08/2017   History of Present Illness  84 yo male admitted with sepsis, acute metabolic encephalopathy, AKI. Hx of prostate ca, CKD  Clinical Impression  On eval, pt required Mod Assist +2 for mobility. He walked ~50 feet with a RW. Pt is unsteady and at risk for falls currently. Pt presents with general weakness, decreased activity tolerance, and impaired gait and balance. No family present during session. Recommend SNF unless home facility staff can provide current level of assist. If pt chooses to return home, may benefit from [place] f/u.     Follow Up Recommendations SNF;Supervision/Assistance - 24 hour(unless home facility staff can provide current level of assist)    Equipment Recommendations  None recommended by PT    Recommendations for Other Services       Precautions / Restrictions Precautions Precautions: Fall Restrictions Weight Bearing Restrictions: No      Mobility  Bed Mobility Overal bed mobility: Needs Assistance Bed Mobility: Supine to Sit     Supine to sit: Mod assist;HOB elevated;+2 for physical assistance;+2 for safety/equipment     General bed mobility comments: Assist for trunk and bil LEs. Utilized bedpad for scooting, positioning. Increased time. Pt is resistant to cues from therapist. He prefers to perfrom task in his own manner.   Transfers Overall transfer level: Needs assistance Equipment used: Rolling walker (2 wheeled) Transfers: Sit to/from Stand Sit to Stand: From elevated surface;Mod assist;+2 physical assistance;+2 safety/equipment         General transfer comment: Assist to rise, stabilize, control descent. VCs safety, hand placement. Increased time.   Ambulation/Gait Ambulation/Gait assistance: Mod assist;+2 physical assistance;+2 safety/equipment Ambulation Distance (Feet): 50 Feet Assistive device: Rolling walker (2  wheeled) Gait Pattern/deviations: Decreased stride length;Step-through pattern     General Gait Details: Assist to stabilize pt and maneuver safely with RW. Vcs safety, proper use of RW. Pt wants to keep walker too far ahead.   Stairs            Wheelchair Mobility    Modified Rankin (Stroke Patients Only)       Balance Overall balance assessment: Needs assistance         Standing balance support: Bilateral upper extremity supported Standing balance-Leahy Scale: Poor                               Pertinent Vitals/Pain Pain Assessment: Faces Faces Pain Scale: No hurt    Home Living Family/patient expects to be discharged to:: Assisted living               Home Equipment: Walker - 4 wheels      Prior Function Level of Independence: Needs assistance   Gait / Transfers Assistance Needed: per pt/chart, he was ambulatory with a 4 wheeled walker. Pt stated he walked to/from dining room  ADL's / Homemaking Assistance Needed: Assist for bathing, dressing        Hand Dominance        Extremity/Trunk Assessment   Upper Extremity Assessment Upper Extremity Assessment: Generalized weakness    Lower Extremity Assessment Lower Extremity Assessment: Generalized weakness    Cervical / Trunk Assessment Cervical / Trunk Assessment: Kyphotic  Communication   Communication: HOH  Cognition Arousal/Alertness: Awake/alert Behavior During Therapy: WFL for tasks assessed/performed Overall Cognitive Status: Difficult to assess  General Comments: pt is difficult to understand at times. Some poor safety awareness. Otherwise, he is [facility].       General Comments      Exercises     Assessment/Plan    PT Assessment Patient needs continued PT services  PT Problem List Decreased strength;Decreased mobility;Decreased balance;Decreased activity tolerance;Decreased knowledge of use of DME;Decreased safety  awareness       PT Treatment Interventions DME instruction;Gait training;Functional mobility training;Therapeutic activities;Balance training;Patient/family education;Therapeutic exercise    PT Goals (Current goals can be found in the Care Plan section)  Acute Rehab PT Goals Patient Stated Goal: none stated. per chart, plan is for pt to return to ALF PT Goal Formulation: With patient Time For Goal Achievement: 06/22/17 Potential to Achieve Goals: Fair    Frequency Min 3X/week   Barriers to discharge        Co-evaluation               AM-PAC PT "6 Clicks" Daily Activity  Outcome Measure Difficulty turning over in bed (including adjusting bedclothes, sheets and blankets)?: Unable Difficulty moving from lying on back to sitting on the side of the bed? : Unable Difficulty sitting down on and standing up from a chair with arms (e.g., wheelchair, bedside commode, etc,.)?: Unable Help needed moving to and from a bed to chair (including a wheelchair)?: A Lot Help needed walking in hospital room?: A Lot Help needed climbing 3-5 steps with a railing? : Total 6 Click Score: 8    End of Session Equipment Utilized During Treatment: Gait belt Activity Tolerance: Patient tolerated treatment well Patient left: in chair;with chair alarm set;with call bell/phone within reach   PT Visit Diagnosis: Muscle weakness (generalized) (M62.81);Difficulty in walking, not elsewhere classified (R26.2)    Time: 7939-0300 PT Time Calculation (min) (ACUTE ONLY): 14 min   Charges:   PT Evaluation $PT Eval Moderate Complexity: 1 Mod     PT G Codes:          Weston Anna, MPT Pager: [phone]

## 2017-06-08 NOTE — Clinical Social Work Note (Signed)
Clinical Social Work Assessment  Patient Details  Name: Anthony Nicholson MRN: [id number] Date of Birth: September 28, 1917  Date of referral:  06/08/17               Reason for consult:  Facility Placement                Permission sought to share information with:  Family Supports Permission granted to share information::  Yes, Verbal Permission Granted  Name::     Anthony Nicholson::     Relationship::  Daughter/son-in-law  Contact Information:  [phone]  Housing/Transportation Living arrangements for the past 2 months:  Independent Living Facility([facility]) Source of Information:  Patient Patient Interpreter Needed:  None Criminal Activity/Legal Involvement Pertinent to Current Situation/Hospitalization:  No - Comment as needed Significant Relationships:  Adult Children Lives with:  Self Do you feel safe going back to the place where you live?  Yes(PT recommending SNF vs 24 hour care) Need for family participation in patient care:  Yes (Comment)  Care giving concerns:  Patient from home alone. Patient resides at [employer] Patient reports that he has home health PT 1-2/week and a man that comes 2x/week. Per patient's RN, patient has 5 hours of help per day in the morning, afternoon and night. PT recommending SNF vs 24 hour care.    Social Worker assessment / plan:  CSW spoke with patient at bedside regarding PT recommendation for SNF for ST rehab. Patient reported that he already has that set up at home. CSW explained to patient SNF vs home health PT. Patient reported that he can continue doing exercises at home. Patient did not verbalize understanding of the differnece between SNF vs home health PT. Patient reported that his daughter also has a home that he can stay at. Patient gave CSW verbal permission to contact his daughter and son in law to discuss discharge planning further.  CSW contacted  patient's daughter to discuss discharge planning, no answer. CSW left voicemail requesting return phone call.  CSW contacted patient's son in law to discuss discharge planning, no answer. CSW left voicemail requesting return phone call.  CSW will continue to try and reach patient's daughter/son in law to discuss discharge planning.   Employment status:  Retired Forensic scientist:  [employer] PT Recommendations:  [place], [facility] / Referral to community resources:  [place]  Patient/Family's Response to care:  UTA  Patient/Family's Understanding of and Emotional Response to Diagnosis, Current Treatment, and Prognosis:  Patient verbalized little to no understanding of PT recommendation for SNF for ST rehab. CSW will continue reaching out to patient's family to discuss discharge planning.   Emotional Assessment Appearance:  Appears stated age Attitude/Demeanor/Rapport:  Other(Cooperative) Affect (typically observed):  Pleasant Orientation:  Oriented to Self, Oriented to Place, Oriented to Situation (Intermittent confusion) Alcohol / Substance use:  Not Applicable Psych involvement (Current and /or in the community):  No (Comment)  Discharge Needs  Concerns to be addressed:  Care Coordination Readmission within the last 30 days:  No Current discharge risk:  Physical Impairment, Lives alone Barriers to Discharge:  Continued Medical Work up   [employer], LCSW 06/08/2017, 3:31 PM

## 2017-06-08 NOTE — Care Management Note (Signed)
Case Management Note  Patient Details  Name: Anthony Nicholson MRN: [id number] Date of Birth: [birth year]  Subjective/Objective: Noted PT recc-SNF. CSW following.                   Action/Plan:d/c plan SNF.   Expected Discharge Date:  (unknown)               Expected Discharge Plan:  Skilled Nursing Facility(with Home Health)  In-House Referral:     Discharge planning Services     Post Acute Care Choice:  Home Health(Active w/[facility] HHRN/HHPT) Choice offered to:  Patient  DME Arranged:    DME Agency:     HH Arranged:    [place] Agency:     Status of Service:  In process, will continue to follow  If discussed at Long Length of Stay Meetings, dates discussed:    Additional Comments:  Dessa Phi, RN 06/08/2017, 3:09 PM

## 2017-06-08 NOTE — Care Management Note (Signed)
Case Management Note  Patient Details  Name: Anthony Nicholson MRN: [id number] Date of Birth: 1917/12/03  Subjective/Objective: CSW confirmed patient is from [facility]. Active w/[facility]-HHRN/PT/OT-rep Lattie Haw following.PT cons-await recc.                   Action/Plan:d/c plan ALF.   Expected Discharge Date:  (unknown)               Expected Discharge Plan:  Home/Self Care(with Home Health)  In-House Referral:     Discharge planning Services     Post Acute Care Choice:  Home Health(Active w/[facility] HHRN/HHPT) Choice offered to:  Patient  DME Arranged:    DME Agency:     HH Arranged:    [place] Agency:     Status of Service:  In process, will continue to follow  If discussed at Long Length of Stay Meetings, dates discussed:    Additional Comments:  Dessa Phi, RN 06/08/2017, 10:49 AM

## 2017-06-09 LAB — BASIC METABOLIC PANEL
ANION GAP: 6 (ref 5–15)
BUN: 27 mg/dL — ABNORMAL HIGH (ref 6–20)
CHLORIDE: 115 mmol/L — AB (ref 101–111)
CO2: 19 mmol/L — AB (ref 22–32)
CREATININE: 1.11 mg/dL (ref 0.61–1.24)
Calcium: 7.7 mg/dL — ABNORMAL LOW (ref 8.9–10.3)
GFR calc Af Amer: >60 mL/min (ref >60)
GFR calc non Af Amer: 54 mL/min — ABNORMAL LOW (ref >60)
Glucose, Bld: 82 mg/dL (ref 65–99)
Potassium: 3.5 mmol/L (ref 3.5–5.1)
SODIUM: 140 mmol/L (ref 135–145)

## 2017-06-09 LAB — CBC WITH DIFFERENTIAL/PLATELET
BASOS PCT: 0 %
Basophils Absolute: 0 10*3/uL (ref 0.0–0.1)
EOS ABS: 0.2 10*3/uL (ref 0.0–0.7)
Eosinophils Relative: 4 %
HEMATOCRIT: 22.6 % — AB (ref 39.0–52.0)
HEMOGLOBIN: 7.3 g/dL — AB (ref 13.0–17.0)
Lymphocytes Relative: 10 %
Lymphs Abs: 0.7 10*3/uL (ref 0.7–4.0)
MCH: 29.3 pg (ref 26.0–34.0)
MCHC: 32.3 g/dL (ref 30.0–36.0)
MCV: 90.8 fL (ref 78.0–100.0)
MONOS PCT: 5 %
Monocytes Absolute: 0.3 10*3/uL (ref 0.1–1.0)
NEUTROS ABS: 5.1 10*3/uL (ref 1.7–7.7)
NEUTROS PCT: 81 %
Platelets: 152 10*3/uL (ref 150–400)
RBC: 2.49 MIL/uL — AB (ref 4.22–5.81)
RDW: 14.9 % (ref 11.5–15.5)
WBC: 6.3 10*3/uL (ref 4.0–10.5)

## 2017-06-09 MED ORDER — LACTATED RINGERS IV SOLN
INTRAVENOUS | Status: AC
  Administered 2017-06-09 – 2017-06-10 (×2): via INTRAVENOUS

## 2017-06-09 MED ORDER — SODIUM BICARBONATE 650 MG PO TABS
650.0000 mg | ORAL_TABLET | Freq: Every day | ORAL | Status: DC
  Administered 2017-06-09 – 2017-06-12 (×4): 650 mg via ORAL
  Filled 2017-06-09 (×4): qty 1

## 2017-06-09 MED ORDER — HYDROXYZINE HCL 10 MG PO TABS
10.0000 mg | ORAL_TABLET | Freq: Every evening | ORAL | Status: DC | PRN
  Filled 2017-06-09: qty 1

## 2017-06-09 MED ORDER — SACCHAROMYCES BOULARDII 250 MG PO CAPS
250.0000 mg | ORAL_CAPSULE | Freq: Two times a day (BID) | ORAL | Status: DC
  Administered 2017-06-09 – 2017-06-12 (×6): 250 mg via ORAL
  Filled 2017-06-09 (×6): qty 1

## 2017-06-09 NOTE — Progress Notes (Signed)
Patient ID: Anthony Nicholson, male   DOB: [birth year], 84 y.o.   MRN: [id number]   Renal function now normalized.  With improvement of renal function, I am not sure how much Dr. Jeffie Pollock or the patient and family would want to pursue non-symptomatic hydronephrosis even if still present in this 84 year old patient.  It would be appropriate for him to follow up with Dr. Jeffie Pollock in the outpatient setting in the next couple of weeks to discuss this and his history of recurrent UTIs.

## 2017-06-09 NOTE — Progress Notes (Signed)
CSW contacted by patient's daughter Burnell Blanks) and informed that the plan is for patient to discharge back to his ILF. Patient's daughter reported she feels patient will do better in his familiar setting. Patient's daughter requested that patient have a bath, CSW informed patient's RN. CSW informed patient's RNCM that patient will dc back to ILF. CSW signing off, no other needs identified at this time.  Abundio Miu, [place] Social Worker [facility] Cell#: [phone]

## 2017-06-09 NOTE — Progress Notes (Signed)
PROGRESS NOTE  Anthony Nicholson RJJ:[id number] DOB: Jan 06, 1918 DOA: 06/06/2017 PCP: [place]  HPI/Recap of past 24 hours:   Foley catheter  draining clear urine Denies abdominal pain No fever caregiver at bedside  bm documented x5 last 24hrs, RN reports no BM today Patient is not able to provide history  Assessment/Plan: Principal Problem:   Sepsis ([place]) Active Problems:   Urinary retention   Adenocarcinoma of prostate ([place])   Lower urinary tract infectious disease   Hematuria   Acute encephalopathy   CKD (chronic kidney disease) stage 3, GFR 30-59 ml/min (HCC)   Pressure injury of skin   Sepsis presented on presentation with fever 103.8, tachycardia heart rate 113 , tachypnea respiration rate 24-26 , hypotension systolic blood pressure in the 90s Presumed source from urine with chronic indwelling Foley catheter, last changed on Monday Due to complaining of abdominal pain, CT renal stone obtained, showed Foley malpositioning replaced new foley  on 2/20 Continue antibiotics, blood culture no growth, urine culture with multiple species Urology consulted, input appreciated  Acute metabolic encephalopathy presented on admission  Improved, now at baseline He is not oriented to year, per daughter this is at baseline . Patient has some sundowning symptoms in the evening Daughter report patient previously lives by himself   AK I on CKD 3:  Likely from mild position of Foley catheter CT renal stone showed stable bilateral hydronephrosis Foley exchanged,  Creatinine continue to improve, repeat ultrasound in 24-48 hours per urology recommendation Renal dosing meds Outpatient urology follow up recommended.  Metabolic acidosis: replace sodium bicarb.  Anemia of chronic disease Had bled after removal of old Foley, bleeding has stopped Hemoglobin drift down to 7.3 today, repeat in a.m. May need prbc transfusion if continue to drift down  History of hypothyroidism,  continue Synthroid  History of prostate cancer status post hormonal therapy  Diet: Dysphagia 2 (Fine chop);Thin liquid   Liquid Administration via: Cup;Straw Medication Administration: Whole meds with puree Supervision: Patient able to self feed;Staff to assist with self feeding;Intermittent supervision to cue for compensatory strategies Compensations: Slow rate;Small sips/bites Postural Changes: Seated upright at 90 degrees   Code Status: DNR  Family Communication: patient and daughter at bedside  Disposition Plan:  Family declined SNF placement,  Return to ALF with [facility], hopefully on 2/23   Consultants:  urology Dr Alinda Money  Procedures:  Foley replacement on 2/20 ( old foley removal,  New coude 58fr inserted on 2/20)   Antibiotics:  Cefepime from admission to 2/21  Omnicef from 2/21   Objective: BP (!) 114/94   Pulse 92   Temp 98.2 F (36.8 C) (Oral)   Resp 20   Ht 5\' 6"  (1.676 m)   Wt 59.9 kg (132 lb)   SpO2 100%   BMI 21.31 kg/m   Intake/Output Summary (Last 24 hours) at 06/09/2017 1600 Last data filed at 06/09/2017 1446 Gross per 24 hour  Intake 1406.25 ml  Output 1150 ml  Net 256.25 ml   Filed Weights   06/06/17 0352  Weight: 59.9 kg (132 lb)    Exam: Patient is examined daily including today on 06/09/2017, exams remain the same as of yesterday except that has changed    General:  NAD,  not oriented to time but to place and person  Cardiovascular: RRR  Respiratory: CTABL  Abdomen: Soft/ND/NT, positive BS  Musculoskeletal: No Edema  Neuro: alert, interactive  Data Reviewed: Basic Metabolic Panel: Recent Labs  Lab 06/06/17 0324 06/07/17 0537 06/08/17 0160  06/09/17 0455  NA 139 140 139 140  K 4.3 3.7 3.7 3.5  CL 107 113* 113* 115*  CO2 22 19* 18* 19*  GLUCOSE 104* 64* 90 82  BUN 29* 38* 44* 27*  CREATININE 1.37* 1.83* 1.61* 1.11  CALCIUM 8.8* 7.9* 7.8* 7.7*   Liver Function Tests: Recent Labs  Lab 06/06/17 0324  AST 20    ALT 11*  ALKPHOS 66  BILITOT 0.7  PROT 6.3*  ALBUMIN 3.4*   No results for input(s): LIPASE, AMYLASE in the last 168 hours. No results for input(s): AMMONIA in the last 168 hours. CBC: Recent Labs  Lab 06/06/17 0324 06/07/17 0537 06/08/17 0520 06/09/17 0455  WBC 9.0 11.3* 8.5 6.3  NEUTROABS 8.7*  --  7.5 5.1  HGB 9.8* 9.5* 8.4* 7.3*  HCT 30.7* 29.7* 25.6* 22.6*  MCV 90.3 91.4 90.1 90.8  PLT 195 149* 148* 152   Cardiac Enzymes:   No results for input(s): CKTOTAL, CKMB, CKMBINDEX, TROPONINI in the last 168 hours. BNP (last 3 results) No results for input(s): BNP in the last 8760 hours.  ProBNP (last 3 results) No results for input(s): PROBNP in the last 8760 hours.  CBG: No results for input(s): GLUCAP in the last 168 hours.  Recent Results (from the past 240 hour(s))  Blood Culture (routine x 2)     Status: None (Preliminary result)   Collection Time: 06/06/17  3:24 AM  Result Value Ref Range Status   Specimen Description   Final    BLOOD RIGHT HAND Performed at [facility] [address] [place], [place] [address]    Special Requests   Final    BOTTLES DRAWN AEROBIC AND ANAEROBIC Blood Culture adequate volume Performed at [facility] [address], [place], [place] [address]    Culture   Final    NO GROWTH 3 DAYS Performed at [facility], [place] [address], [place], [place] [address]    Report Status PENDING  Incomplete  Blood Culture (routine x 2)     Status: None (Preliminary result)   Collection Time: 06/06/17  3:52 AM  Result Value Ref Range Status   Specimen Description   Final    BLOOD RIGHT ANTECUBITAL Performed at [facility] [address] [place], [place] [address]    Special Requests   Final    BOTTLES DRAWN AEROBIC AND ANAEROBIC Blood Culture adequate volume Performed at [facility] [address], [place], [place] [address]    Culture   Final    NO  GROWTH 3 DAYS Performed at [facility], [place] [address] [place], [place] [address]    Report Status PENDING  Incomplete  Urine culture     Status: Abnormal   Collection Time: 06/06/17  6:01 AM  Result Value Ref Range Status   Specimen Description   Final    URINE, CLEAN CATCH Performed at [facility], [place] [address] [place], [place] [address]    Special Requests   Final    NONE Performed at [facility], [place] [address] [place], [place] [address]    Culture MULTIPLE SPECIES PRESENT, SUGGEST RECOLLECTION (A)  Final   Report Status 06/07/2017 FINAL  Final     Studies: No results found.  Scheduled Meds: . cefdinir  300 mg Oral Daily  . feeding supplement (ENSURE ENLIVE)  237 mL Oral Q24H  . levothyroxine  25 mcg Oral QAC breakfast  .  saccharomyces boulardii  250 mg Oral BID  . sodium bicarbonate  650 mg Oral Q1200    Continuous Infusions: . lactated ringers       Time spent: 25 mins I have personally reviewed and interpreted on  06/09/2017 daily labs, tele strips, imagings as discussed above under date review session and assessment and plans.  I reviewed all nursing notes, pharmacy notes, consultant notes,  vitals, pertinent old records  I have discussed plan of care as described above with RN , patient and family on 06/09/2017   Florencia Reasons MD, PhD  [facility] Pager [phone]. If 7PM-7AM, please contact night-coverage at [email or web address], password [email or web address] 06/09/2017, 4:00 PM  LOS: 3 days

## 2017-06-09 NOTE — Evaluation (Signed)
Clinical/Bedside Swallow Evaluation Patient Details  Name: Duilio Heritage MRN: [id number] Date of Birth: [birth year]  Today's Date: 06/09/2017 Time: SLP Start Time (ACUTE ONLY): 0800 SLP Stop Time (ACUTE ONLY): 0830 SLP Time Calculation (min) (ACUTE ONLY): 30 min  Past Medical History:  Past Medical History:  Diagnosis Date  . Basal cell carcinoma   . HOH (hard of hearing)   . Indwelling urinary catheter present   . Laceration of arm 02/04/2013   Past Surgical History:  Past Surgical History:  Procedure Laterality Date  .  sharpnel removal    1943    . CYSTOSCOPY N/A 10/02/2014   Procedure: FLEXIBLE CYSTOSCOPY;  Surgeon: Irine Seal, MD;  Location: [facility];  Service: Urology;  Laterality: N/A;  . I&D EXTREMITY Left 02/04/2013   Procedure: IRRIGATION AND DEBRIDEMENT Left Arm Laceration with Repair as Necessary;  Surgeon: Wylene Simmer, MD;  Location: [facility];  Service: Orthopedics;  Laterality: Left;  . INCISION AND DRAINAGE Left 02/04/2013   left arm laceration    Dr Doran Durand  . PROSTATE BIOPSY N/A 10/02/2014   Procedure: PROSTATE ULTRASOUND AND BIOPSY ;  Surgeon: Irine Seal, MD;  Location: [facility];  Service: Urology;  Laterality: N/A;   HPI:  84 year old male admitted 06/06/17 with confusion, fever, catheter pain. PMH significant for prostate CA, recurrent UTI, HOH   Assessment / Plan / Recommendation Clinical Impression  Pt sleeping, and presented with open mouth breathing upon arrival of SLP. Pt awakened easily, and was then observed eating breakfast. Extended oral prep of toast, likely due to xerostomia. No overt s/s aspiration observed on any consistency. Per RN, pt was given seroquel last night, which may be contributing to dry mouth and poorly intelligible speech. Will downgrade diet to dys 2 and thin liquids for energy conservation and ease of oral prep. Anticipate being able to advance to back to regular solids as strength and endurance return to baseline. safe swallow precautions posted  at [facility] and discussed with RN. ST will follow briefly to assess diet tolerance and provide additional education.   SLP Visit Diagnosis: Dysphagia, unspecified (R13.10)    Aspiration Risk  Mild aspiration risk    Diet Recommendation Dysphagia 2 (Fine chop);Thin liquid   Liquid Administration via: Cup;Straw Medication Administration: Whole meds with puree Supervision: Patient able to self feed;Staff to assist with self feeding;Intermittent supervision to cue for compensatory strategies Compensations: Slow rate;Small sips/bites Postural Changes: Seated upright at 90 degrees    Other  Recommendations Oral Care Recommendations: Oral care BID   Follow up Recommendations None      Frequency and Duration min 1 x/week  1 week;2 weeks       Prognosis Prognosis for Safe Diet Advancement: Good      Swallow Study   General Date of Onset: 06/06/17 HPI: 84 year old male admitted 06/06/17 with confusion, fever, catheter pain. PMH significant for prostate CA, recurrent UTI, HOH Type of Study: Bedside Swallow Evaluation Previous Swallow Assessment: no prior ST intervention Diet Prior to this Study: Regular;Thin liquids Temperature Spikes Noted: Yes(103.8 on admit) Respiratory Status: Room air History of Recent Intubation: No Behavior/Cognition: Alert;Cooperative;Pleasant mood Oral Cavity Assessment: Dry Oral Care Completed by SLP: No Oral Cavity - Dentition: Adequate natural dentition Vision: Functional for self-feeding Self-Feeding Abilities: Able to feed self Patient Positioning: Upright in bed Baseline Vocal Quality: Low vocal intensity Volitional Cough: Strong Volitional Swallow: Able to elicit    Oral/Motor/Sensory Function Overall Oral Motor/Sensory Function: Mild impairment(generalized weakness/very dry)   Ice  Chips Ice chips: Not tested   Thin Liquid Thin Liquid: Within functional limits Presentation: Straw;Cup;Self Fed    Nectar Thick Nectar Thick Liquid: Not tested    Honey Thick Honey Thick Liquid: Not tested   Puree Puree: Within functional limits Presentation: Self Fed   Solid   GO   Solid: Impaired Presentation: Self Fed Oral Phase Impairments: Impaired mastication Oral Phase Functional Implications: Prolonged oral transit       Kaprice Kage B. Quentin Ore [facility] [place], [place] Speech Language Pathologist [phone]  Shonna Chock 06/09/2017,8:40 AM

## 2017-06-09 NOTE — Plan of Care (Signed)
  Health Behavior/Discharge Planning: Ability to manage health-related needs will improve 06/09/2017 0034 - Progressing by Ashley Murrain, RN   Clinical Measurements: Ability to maintain clinical measurements within normal limits will improve 06/09/2017 0034 - Progressing by Ashley Murrain, RN   Clinical Measurements: Will remain free from infection 06/09/2017 0034 - Progressing by Ashley Murrain, RN   Safety: Ability to remain free from injury will improve 06/09/2017 0034 - Progressing by Ashley Murrain, RN

## 2017-06-09 NOTE — Progress Notes (Signed)
CSW contacted by patient's son in law Louann Sjogren). CSW and patient's son in law discussed patient's discharge planning. CSW informed patient's son in law that PT recommended SNF vs 24 hour care. Patient's son in law reported that patient resides in [place] and receives 15 hours/week of [facility] that is set up through the [place]. Patient's son in law reported that patient also receives an additional care 3 nights a week from [facility] that is private pay. Patient's son in law reported that patient is currently receiving home health nursing and PT through [facility]. Patient's son in law reported that patient also has 4 girlfriends that come over to assist with fixing meals. Patient's son in law reported that he needed to discuss patient's discharge plan with his wife prior to making a decision. Patient's son in law agreed to follow up after a decision is made regarding patient's discharge plans.   CSW will continue to follow and assist with discharge planning.  Abundio Miu, [place] Social Worker [facility] Cell#: [phone]

## 2017-06-10 ENCOUNTER — Inpatient Hospital Stay (HOSPITAL_COMMUNITY): Payer: Non-veteran care

## 2017-06-10 DIAGNOSIS — E876 Hypokalemia: Secondary | ICD-10-CM

## 2017-06-10 LAB — BASIC METABOLIC PANEL
Anion gap: 8 (ref 5–15)
BUN: 17 mg/dL (ref 6–20)
CHLORIDE: 111 mmol/L (ref 101–111)
CO2: 20 mmol/L — AB (ref 22–32)
CREATININE: 0.99 mg/dL (ref 0.61–1.24)
Calcium: 7.7 mg/dL — ABNORMAL LOW (ref 8.9–10.3)
GFR calc Af Amer: >60 mL/min (ref >60)
GFR calc non Af Amer: >60 mL/min (ref >60)
GLUCOSE: 83 mg/dL (ref 65–99)
POTASSIUM: 3.4 mmol/L — AB (ref 3.5–5.1)
SODIUM: 139 mmol/L (ref 135–145)

## 2017-06-10 LAB — CBC WITH DIFFERENTIAL/PLATELET
Basophils Absolute: 0 10*3/uL (ref 0.0–0.1)
Basophils Relative: 0 %
Eosinophils Absolute: 0.3 10*3/uL (ref 0.0–0.7)
Eosinophils Relative: 3 %
HEMATOCRIT: 23.7 % — AB (ref 39.0–52.0)
HEMOGLOBIN: 7.6 g/dL — AB (ref 13.0–17.0)
LYMPHS ABS: 0.7 10*3/uL (ref 0.7–4.0)
LYMPHS PCT: 9 %
MCH: 29.2 pg (ref 26.0–34.0)
MCHC: 32.1 g/dL (ref 30.0–36.0)
MCV: 91.2 fL (ref 78.0–100.0)
MONOS PCT: 8 %
Monocytes Absolute: 0.7 10*3/uL (ref 0.1–1.0)
NEUTROS PCT: 80 %
Neutro Abs: 6.2 10*3/uL (ref 1.7–7.7)
Platelets: 169 10*3/uL (ref 150–400)
RBC: 2.6 MIL/uL — AB (ref 4.22–5.81)
RDW: 14.8 % (ref 11.5–15.5)
WBC: 7.8 10*3/uL (ref 4.0–10.5)

## 2017-06-10 LAB — ABO/RH: ABO/RH(D): A POS

## 2017-06-10 LAB — PREPARE RBC (CROSSMATCH)

## 2017-06-10 LAB — MAGNESIUM: MAGNESIUM: 1.4 mg/dL — AB (ref 1.7–2.4)

## 2017-06-10 MED ORDER — SODIUM CHLORIDE 0.9 % IV SOLN
Freq: Once | INTRAVENOUS | Status: AC
  Administered 2017-06-10: 15:00:00 via INTRAVENOUS

## 2017-06-10 MED ORDER — MAGNESIUM SULFATE 4 GM/100ML IV SOLN
4.0000 g | Freq: Once | INTRAVENOUS | Status: AC
  Administered 2017-06-10: 4 g via INTRAVENOUS
  Filled 2017-06-10: qty 100

## 2017-06-10 MED ORDER — POTASSIUM CHLORIDE CRYS ER 20 MEQ PO TBCR
40.0000 meq | EXTENDED_RELEASE_TABLET | Freq: Once | ORAL | Status: AC
  Administered 2017-06-10: 40 meq via ORAL
  Filled 2017-06-10: qty 2

## 2017-06-10 NOTE — Progress Notes (Signed)
PT Cancellation Note  Patient Details Name: Jachai Okazaki MRN: [id number] DOB: [birth year]   Cancelled Treatment:    Reason Eval/Treat Not Completed: Medical issues which prohibited therapy Received call from nursing to see pt today for family to observe mobility however upon checking on pt, plan is for pt to now get PRBCs and iron.  Per RN, family plans to be present tomorrow morning.  Will attempt to check back as schedule permits.   Kourtni Stineman,KATHrine E 06/10/2017, 4:12 PM Carmelia Bake, PT, DPT 06/10/2017 Pager: [phone]

## 2017-06-10 NOTE — Clinical Social Work Note (Signed)
CSW received call from MD advising CSW that family open to SNF for rehab. Per MD, patient getting blood today and should be ready for discharge tomorrow. Weekend CSW will f/u with patient/family regarding discharge disposition.  Piera Downs Givens, MSW, LCSW Licensed Clinical Social Worker [place] [phone]

## 2017-06-10 NOTE — Progress Notes (Signed)
PROGRESS NOTE  Kennon Encinas [id number] DOB: Nov 11, 1917 DOA: 06/06/2017 PCP: [place]  HPI/Recap of past 24 hours:  Foley catheter  draining clear urine Denies abdominal pain No fever Patient is not able to provide history  He appear weak and drowsy today, family at bedside  Assessment/Plan: Principal Problem:   Sepsis ([place]) Active Problems:   Urinary retention   Adenocarcinoma of prostate ([place])   Lower urinary tract infectious disease   Hematuria   Acute encephalopathy   CKD (chronic kidney disease) stage 3, GFR 30-59 ml/min (HCC)   Pressure injury of skin   Sepsis presented on presentation with fever 103.8, tachycardia heart rate 113 , tachypnea respiration rate 24-26 , hypotension systolic blood pressure in the 90s Presumed source from urine with chronic indwelling Foley catheter, last changed on Monday Due to complaining of abdominal pain, CT renal stone obtained, showed Foley malpositioning replaced new foley  on 2/20 blood culture no growth, urine culture with multiple species, Continue antibiotics, plan for total of 10 days of antibiotic treatment.  Urology consulted, input appreciated.  Acute metabolic encephalopathy presented on admission  Improved, now at baseline He is not oriented to year, per daughter this is at baseline . Patient has some sundowning symptoms in the evening Daughter report patient previously lives by himself   AK I on CKD 3:  Likely from mild position of Foley catheter CT renal stone showed stable bilateral hydronephrosis Foley exchanged,  BUN and creatinine normalized, will repeat renal ultrasound to evaluate bilateral hydronephrosis per urology recommendation  Renal dosing meds Outpatient urology follow up recommended.  Hypokalemia/hypomagnesemia Replace K and mag  Metabolic acidosis: Improving, continue sodium bicarb.  Acute on chronic anemia, Anemia of chronic disease Had bled after removal of old Foley, bleeding  has stopped Hemoglobin drift down , he appeared pale and weak , family reported history of iron infusion  We will give 1 units PRBC today , will  give IV iron tomorrow .   History of hypothyroidism, continue Synthroid  History of prostate cancer status post hormonal therapy  Diet: Dysphagia 2 (Fine chop);Thin liquid   Liquid Administration via: Cup;Straw Medication Administration: Whole meds with puree Supervision: Patient able to self feed;Staff to assist with self feeding;Intermittent supervision to cue for compensatory strategies Compensations: Slow rate;Small sips/bites Postural Changes: Seated upright at 90 degrees   Code Status: DNR  Family Communication: patient and daughter at bedside  Disposition Plan:  Likely will need SNF placement, family prefers [facility]    Consultants:  urology Dr Alinda Money  Procedures:  Foley replacement on 2/20 ( old foley removal,  New coude 29fr inserted on 2/20)   Antibiotics:  Cefepime from admission to 2/21  Omnicef from 2/21   Objective: BP 90/69 (BP Location: Right Arm)   Pulse 89   Temp 98.7 F (37.1 C) (Oral)   Resp 20   Ht 5\' 6"  (1.676 m)   Wt 59.9 kg (132 lb)   SpO2 96%   BMI 21.31 kg/m   Intake/Output Summary (Last 24 hours) at 06/10/2017 1437 Last data filed at 06/10/2017 1300 Gross per 24 hour  Intake 13181.25 ml  Output 1300 ml  Net 11881.25 ml   Filed Weights   06/06/17 0352  Weight: 59.9 kg (132 lb)    Exam: Patient is examined daily including today on 06/10/2017, exams remain the same as of yesterday except that has changed    General:  NAD,  not oriented to time but to place and person, appear  weak and drowsy.  Cardiovascular: RRR  Respiratory: CTABL  Abdomen: Soft/ND/NT, positive BS  Musculoskeletal: No Edema  Neuro: Weak and drowsy  Data Reviewed: Basic Metabolic Panel: Recent Labs  Lab 06/06/17 0324 06/07/17 0537 06/08/17 0520 06/09/17 0455 06/10/17 0539  NA 139 140 139 140  139  K 4.3 3.7 3.7 3.5 3.4*  CL 107 113* 113* 115* 111  CO2 22 19* 18* 19* 20*  GLUCOSE 104* 64* 90 82 83  BUN 29* 38* 44* 27* 17  CREATININE 1.37* 1.83* 1.61* 1.11 0.99  CALCIUM 8.8* 7.9* 7.8* 7.7* 7.7*  MG  --   --   --   --  1.4*   Liver Function Tests: Recent Labs  Lab 06/06/17 0324  AST 20  ALT 11*  ALKPHOS 66  BILITOT 0.7  PROT 6.3*  ALBUMIN 3.4*   No results for input(s): LIPASE, AMYLASE in the last 168 hours. No results for input(s): AMMONIA in the last 168 hours. CBC: Recent Labs  Lab 06/06/17 0324 06/07/17 0537 06/08/17 0520 06/09/17 0455 06/10/17 0539  WBC 9.0 11.3* 8.5 6.3 7.8  NEUTROABS 8.7*  --  7.5 5.1 6.2  HGB 9.8* 9.5* 8.4* 7.3* 7.6*  HCT 30.7* 29.7* 25.6* 22.6* 23.7*  MCV 90.3 91.4 90.1 90.8 91.2  PLT 195 149* 148* 152 169   Cardiac Enzymes:   No results for input(s): CKTOTAL, CKMB, CKMBINDEX, TROPONINI in the last 168 hours. BNP (last 3 results) No results for input(s): BNP in the last 8760 hours.  ProBNP (last 3 results) No results for input(s): PROBNP in the last 8760 hours.  CBG: No results for input(s): GLUCAP in the last 168 hours.  Recent Results (from the past 240 hour(s))  Blood Culture (routine x 2)     Status: None (Preliminary result)   Collection Time: 06/06/17  3:24 AM  Result Value Ref Range Status   Specimen Description   Final    BLOOD RIGHT HAND Performed at [facility] [address] [place], [place] [address]    Special Requests   Final    BOTTLES DRAWN AEROBIC AND ANAEROBIC Blood Culture adequate volume Performed at [facility] [address], [place], [place] [address]    Culture   Final    NO GROWTH 4 DAYS Performed at [facility], [place] [address], [place], [place] [address]    Report Status PENDING  Incomplete  Blood Culture (routine x 2)     Status: None (Preliminary result)   Collection Time: 06/06/17  3:52 AM  Result Value Ref Range Status   Specimen  Description   Final    BLOOD RIGHT ANTECUBITAL Performed at [facility] [address] [place], [place] [address]    Special Requests   Final    BOTTLES DRAWN AEROBIC AND ANAEROBIC Blood Culture adequate volume Performed at [facility] [address] [place], [place] [address]    Culture   Final    NO GROWTH 4 DAYS Performed at [facility], [place] [address], [place], [place] [address]    Report Status PENDING  Incomplete  Urine culture     Status: Abnormal   Collection Time: 06/06/17  6:01 AM  Result Value Ref Range Status   Specimen Description   Final    URINE, CLEAN CATCH Performed at [facility], [place] [address], [place], [place] [address]    Special Requests   Final    NONE  Performed at [facility], [place] [address], [place], [place] [address]    Culture MULTIPLE SPECIES PRESENT, SUGGEST RECOLLECTION (A)  Final   Report Status 06/07/2017 FINAL  Final     Studies: No results found.  Scheduled Meds: . cefdinir  300 mg Oral Daily  . feeding supplement (ENSURE ENLIVE)  237 mL Oral Q24H  . levothyroxine  25 mcg Oral QAC breakfast  . saccharomyces boulardii  250 mg Oral BID  . sodium bicarbonate  650 mg Oral Q1200    Continuous Infusions: . sodium chloride    . lactated ringers 75 mL/hr at 06/10/17 0525     Time spent: 35 mins, 50% time spent on coordination of care.  Case discussed with family and Education officer, museum. I have personally reviewed and interpreted on  06/10/2017 daily labs, tele strips, imagings as discussed above under date review session and assessment and plans.  I reviewed all nursing notes, pharmacy notes, consultant notes,  vitals, pertinent old records  I have discussed plan of care as described above with RN , patient and family on 06/10/2017   Florencia Reasons MD, PhD  [facility] Pager [phone]. If 7PM-7AM, please contact night-coverage at [email or web address], password  [facility] 06/10/2017, 2:37 PM  LOS: 4 days

## 2017-06-11 ENCOUNTER — Inpatient Hospital Stay (HOSPITAL_COMMUNITY): Payer: Non-veteran care

## 2017-06-11 LAB — BASIC METABOLIC PANEL
ANION GAP: 9 (ref 5–15)
BUN: 15 mg/dL (ref 6–20)
CALCIUM: 7.7 mg/dL — AB (ref 8.9–10.3)
CO2: 21 mmol/L — ABNORMAL LOW (ref 22–32)
CREATININE: 0.98 mg/dL (ref 0.61–1.24)
Chloride: 108 mmol/L (ref 101–111)
Glucose, Bld: 92 mg/dL (ref 65–99)
Potassium: 3.7 mmol/L (ref 3.5–5.1)
SODIUM: 138 mmol/L (ref 135–145)

## 2017-06-11 LAB — CULTURE, BLOOD (ROUTINE X 2)
Culture: NO GROWTH
Culture: NO GROWTH
SPECIAL REQUESTS: ADEQUATE
Special Requests: ADEQUATE

## 2017-06-11 LAB — CBC WITH DIFFERENTIAL/PLATELET
Basophils Absolute: 0 10*3/uL (ref 0.0–0.1)
Basophils Relative: 0 %
EOS PCT: 4 %
Eosinophils Absolute: 0.4 10*3/uL (ref 0.0–0.7)
HEMATOCRIT: 28.9 % — AB (ref 39.0–52.0)
HEMOGLOBIN: 9.2 g/dL — AB (ref 13.0–17.0)
LYMPHS ABS: 0.9 10*3/uL (ref 0.7–4.0)
Lymphocytes Relative: 10 %
MCH: 27.3 pg (ref 26.0–34.0)
MCHC: 31.8 g/dL (ref 30.0–36.0)
MCV: 85.8 fL (ref 78.0–100.0)
Monocytes Absolute: 0.8 10*3/uL (ref 0.1–1.0)
Monocytes Relative: 9 %
NEUTROS ABS: 7.3 10*3/uL (ref 1.7–7.7)
Neutrophils Relative %: 77 %
Platelets: 195 10*3/uL (ref 150–400)
RBC: 3.37 MIL/uL — AB (ref 4.22–5.81)
RDW: 16.8 % — AB (ref 11.5–15.5)
WBC: 9.4 10*3/uL (ref 4.0–10.5)

## 2017-06-11 LAB — MAGNESIUM: MAGNESIUM: 2 mg/dL (ref 1.7–2.4)

## 2017-06-11 MED ORDER — FOLIC ACID 1 MG PO TABS
1.0000 mg | ORAL_TABLET | Freq: Every day | ORAL | Status: DC
  Administered 2017-06-11 – 2017-06-12 (×2): 1 mg via ORAL
  Filled 2017-06-11 (×2): qty 1

## 2017-06-11 MED ORDER — CARBOXYMETHYLCELLULOSE SODIUM 0.5 % OP SOLN: 1.0000 [drp] | Freq: Three times a day (TID) | OPHTHALMIC | Status: DC | PRN

## 2017-06-11 MED ORDER — CYANOCOBALAMIN 500 MCG PO TABS
500.0000 ug | ORAL_TABLET | Freq: Every day | ORAL | Status: DC
  Administered 2017-06-11 – 2017-06-12 (×2): 500 ug via ORAL
  Filled 2017-06-11 (×2): qty 1

## 2017-06-11 MED ORDER — POLYVINYL ALCOHOL 1.4 % OP SOLN: 1.0000 [drp] | Freq: Three times a day (TID) | OPHTHALMIC | Status: DC | PRN

## 2017-06-11 MED ORDER — SODIUM CHLORIDE 0.9 % IV SOLN
510.0000 mg | Freq: Once | INTRAVENOUS | Status: AC
  Administered 2017-06-11: 510 mg via INTRAVENOUS
  Filled 2017-06-11: qty 17

## 2017-06-11 MED ORDER — VITAMIN D3 25 MCG (1000 UNIT) PO TABS
2000.0000 [IU] | ORAL_TABLET | Freq: Every day | ORAL | Status: DC
  Administered 2017-06-11 – 2017-06-12 (×2): 2000 [IU] via ORAL
  Filled 2017-06-11 (×2): qty 2

## 2017-06-11 NOTE — Progress Notes (Signed)
CSW spoke to patient's daughter and daughter's husband via phone this morning, and met with them with patient at bedside this afternoon. Patient's family would like for patient to discharge to SNF rather than home, given patient's weakness and decompensation. They would like patient to go to short term rehab before returning home. Family prefers [facility], [facility], or [facility]. CSW sent out initial SNF referrals. Patient's primary coverage is through the [place], but he also has [employer], family confirmed. Family plans for patient to go to rehab under [employer] benefit, as [facility] contracted facilities are not closely located. When referrals accepted and facility identified, patient will need [facility] authorization for SNF. CSW to follow and support with disposition planning.  Estanislado Emms, [place]

## 2017-06-11 NOTE — Progress Notes (Addendum)
Physical Therapy Treatment Patient Details Name: Anthony Nicholson MRN: [id number] DOB: 1917-10-26 Today's Date: 06/11/2017    History of Present Illness 84 yo male admitted with sepsis, acute metabolic encephalopathy, AKI. Hx of prostate ca, CKD    PT Comments    Mod assist for supine to sit, min assist for balance to ambulate 60' with rollator. Pt requires assistance for bed mobility and min/guard assist for ambulation recommended due to high fall risk. I do not feel pt is safe to return to ILF. ST-SNF recommended.   Follow Up Recommendations  SNF;Supervision/Assistance - 24 hour(unless ALF staff can provide current level of assist)     Equipment Recommendations  None recommended by PT    Recommendations for Other Services       Precautions / Restrictions Precautions Precautions: Fall Precaution Comments: family reports h/o 4 falls in past 1 year Restrictions Weight Bearing Restrictions: No    Mobility  Bed Mobility Overal bed mobility: Needs Assistance Bed Mobility: Supine to Sit     Supine to sit: Mod assist;HOB elevated     General bed mobility comments: Assist for trunk and bil LEs. Utilized bedpad for scooting, positioning. Increased time. Pt is resistant to cues from therapist. He prefers to perfrom task in his own manner.   Transfers Overall transfer level: Needs assistance Equipment used: Rolling walker (2 wheeled) Transfers: Sit to/from Stand Sit to Stand: Min guard         General transfer comment: good hand placement and safety awareness (remembered to lock brakes on rollator), min/guard safety; flexed posture in standing (this is baseline per family)  Ambulation/Gait Ambulation/Gait assistance: Min guard;Min assist Ambulation Distance (Feet): 60 Feet Assistive device: Rolling walker -4 wheeled rollator Gait Pattern/deviations: Decreased stride length;Step-through pattern;Trunk flexed   Gait velocity interpretation: at or above normal speed for  age/gender General Gait Details: min to min/guard Assist to stabilize pt and maneuver safely with RW. Vcs safety, proper use of RW. Pt wants to keep walker too far ahead. No overt LOB but pt mildly unsteady.    Stairs            Wheelchair Mobility    Modified Rankin (Stroke Patients Only)       Balance Overall balance assessment: Needs assistance Sitting-balance support: Feet supported;No upper extremity supported Sitting balance-Leahy Scale: Good     Standing balance support: Bilateral upper extremity supported Standing balance-Leahy Scale: Poor                              Cognition Arousal/Alertness: Awake/alert Behavior During Therapy: WFL for tasks assessed/performed Overall Cognitive Status: Within Functional Limits for tasks assessed                                 General Comments: pt is difficult to understand at times. Otherwise, he is [facility].       Exercises      General Comments        Pertinent Vitals/Pain Pain Assessment: No/denies pain    Home Living                      Prior Function            PT Goals (current goals can now be found in the care plan section) Acute Rehab PT Goals Patient Stated Goal: hopes to DC to ILF PT Goal Formulation:  With patient/family Time For Goal Achievement: 06/22/17 Potential to Achieve Goals: Fair Progress towards PT goals: Progressing toward goals    Frequency    Min 3X/week      PT Plan Current plan remains appropriate    Co-evaluation              AM-PAC PT "6 Clicks" Daily Activity  Outcome Measure  Difficulty turning over in bed (including adjusting bedclothes, sheets and blankets)?: Unable Difficulty moving from lying on back to sitting on the side of the bed? : Unable Difficulty sitting down on and standing up from a chair with arms (e.g., wheelchair, bedside commode, etc,.)?: A Little Help needed moving to and from a bed to chair (including a  wheelchair)?: A Little Help needed walking in hospital room?: A Little Help needed climbing 3-5 steps with a railing? : Total 6 Click Score: 12    End of Session Equipment Utilized During Treatment: Gait belt Activity Tolerance: Patient tolerated treatment well Patient left: in chair;with chair alarm set;with call bell/phone within reach;with family/visitor present;with nursing/sitter in room Nurse Communication: Mobility status PT Visit Diagnosis: Muscle weakness (generalized) (M62.81);Difficulty in walking, not elsewhere classified (R26.2)     Time: 2080-2233 PT Time Calculation (min) (ACUTE ONLY): 40 min  Charges:  $Gait Training: 8-22 mins $Therapeutic Activity: 23-37 mins                    G Codes:          Philomena Doheny 06/11/2017, 11:19 AM [phone]

## 2017-06-11 NOTE — NC FL2 (Signed)
[place] LEVEL OF CARE SCREENING TOOL     IDENTIFICATION  Patient Name: Anthony Nicholson Birthdate: 07-06-1917 Sex: male Admission Date (Current Location): 06/06/2017  [facility] [place] Number:  Herbalist and Address:  [facility] [place], [place] [address] [place], [place] [address]      Provider Number: [id number]  Attending Physician Name and Address:  Florencia Reasons, MD  Relative Name and Phone Number:  Burnell Blanks, daughter, [phone]    Current Level of Care: Hospital Recommended Level of Care: [place] Prior Approval Number:    Date Approved/Denied:   PASRR Number: [id number]  Discharge Plan: SNF    Current Diagnoses: Patient Active Problem List   Diagnosis Date Noted  . Pressure injury of skin 06/07/2017  . Hematuria 06/06/2017  . Acute encephalopathy 06/06/2017  . CKD (chronic kidney disease) stage 3, GFR 30-59 ml/min (HCC) 06/06/2017  . Elevated troponin I level 12/12/2014  . Demand ischemia ([place])   . Sepsis ([place]) 12/09/2014  . Lower urinary tract infectious disease 12/09/2014  . Adenocarcinoma of prostate ([place]) 10/03/2014  . Acute renal failure syndrome ([place])   . Elevated PSA 10/01/2014  . Nodular prostate 10/01/2014  . Urinary retention   . Acute urinary retention 09/29/2014  . Acute renal failure ([place]) 09/29/2014  . Hyperkalemia 09/29/2014  . Anemia of chronic disease 09/29/2014  . Pain in lower limb 08/09/2013  . Onychomycosis 11/09/2012  . Pain in joint, ankle and foot 11/09/2012    Orientation RESPIRATION BLADDER Height & Weight     Self, Place  Normal Indwelling catheter Weight: 132 lb (59.9 kg) Height:  5\' 6"  (167.6 cm)  BEHAVIORAL SYMPTOMS/MOOD NEUROLOGICAL BOWEL NUTRITION STATUS      Incontinent Diet(please see DC summary)  AMBULATORY STATUS COMMUNICATION OF NEEDS Skin   Extensive Assist Verbally PU Stage and Appropriate Care(PU stage II sacrum)                        Personal Care Assistance Level of Assistance  Bathing, Feeding, Dressing Bathing Assistance: Maximum assistance Feeding assistance: Limited assistance Dressing Assistance: Maximum assistance     Functional Limitations Info  Sight, Hearing, Speech Sight Info: Adequate Hearing Info: Impaired(hearing aid) Speech Info: Adequate    SPECIAL CARE FACTORS FREQUENCY  PT (By licensed PT)     PT Frequency: 5x/week              Contractures Contractures Info: Not present    Additional Factors Info  Code Status, Allergies Code Status Info: DNR Allergies Info: No Known Allergies           Current Medications (06/11/2017):  This is the current hospital active medication list Current Facility-Administered Medications  Medication Dose Route Frequency Provider Last Rate Last Dose  . acetaminophen (TYLENOL) tablet 650 mg  650 mg Oral Q6H PRN Etta Quill, DO   650 mg at 06/06/17 2250   Or  . acetaminophen (TYLENOL) suppository 650 mg  650 mg Rectal Q6H PRN Etta Quill, DO      . cefdinir (OMNICEF) 125 MG/5ML suspension 300 mg  300 mg Oral Daily Florencia Reasons, MD   300 mg at 06/10/17 1017  . feeding supplement (ENSURE ENLIVE) (ENSURE ENLIVE) liquid 237 mL  237 mL Oral Q24H Florencia Reasons, MD   237 mL at 06/10/17 1527  . ferumoxytol (FERAHEME) 510 mg in sodium chloride 0.9 % 100 mL IVPB  510 mg Intravenous Once Erlinda Hong,  Annamaria Boots, MD      . hydrOXYzine (ATARAX/VISTARIL) tablet 10 mg  10 mg Oral QHS PRN Florencia Reasons, MD      . levothyroxine (SYNTHROID, LEVOTHROID) tablet 25 mcg  25 mcg Oral QAC breakfast Florencia Reasons, MD   25 mcg at 06/11/17 0840  . lidocaine (XYLOCAINE) 5 % ointment   Topical Daily PRN Theodis Blaze, MD      . ondansetron [facility]) tablet 4 mg  4 mg Oral Q6H PRN Etta Quill, DO       Or  . ondansetron [facility]) injection 4 mg  4 mg Intravenous Q6H PRN Etta Quill, DO      . saccharomyces boulardii (FLORASTOR) capsule 250 mg  250 mg Oral BID Florencia Reasons, MD   250 mg at 06/10/17  2131  . sodium bicarbonate tablet 650 mg  650 mg Oral Q1200 Florencia Reasons, MD   650 mg at 06/10/17 1247     Discharge Medications: Please see discharge summary for a list of discharge medications.  Relevant Imaging Results:  Relevant Lab Results:   Additional Information SSN: [id number]  Estanislado Emms, LCSW

## 2017-06-11 NOTE — Progress Notes (Signed)
PROGRESS NOTE  Tristain Daily LSL:[id number] DOB: 30-Jan-1918 DOA: 06/06/2017 PCP: [place]  HPI/Recap of past 24 hours:  Foley catheter  draining clear urine Denies abdominal pain No fever Patient is not able to provide history  He seems to be stronger and more awake today, family at bedside  Assessment/Plan: Principal Problem:   Sepsis ([place]) Active Problems:   Urinary retention   Adenocarcinoma of prostate ([place])   Lower urinary tract infectious disease   Hematuria   Acute encephalopathy   CKD (chronic kidney disease) stage 3, GFR 30-59 ml/min (HCC)   Pressure injury of skin   Sepsis presented on presentation with fever 103.8, tachycardia heart rate 113 , tachypnea respiration rate 24-26 , hypotension systolic blood pressure in the 90s Presumed source from urine with chronic indwelling Foley catheter, last changed on Monday Due to complaining of abdominal pain, CT renal stone obtained, showed Foley malpositioning replaced new foley  on 2/20 blood culture no growth, urine culture with multiple species, Continue antibiotics, plan for total of 10 days of antibiotic treatment.  Urology consulted, input appreciated.  Acute metabolic encephalopathy presented on admission  Improved, now at baseline He is not oriented to year, per daughter this is at baseline . Patient has some sundowning symptoms in the evening Daughter report patient previously lives by himself   AK I on CKD 3:  Likely from mild position of Foley catheter CT renal stone showed  bilateral hydronephrosis on 2/20 Foley exchanged,  BUN and creatinine normalized,  repeat renal ultrasound on 2/24 :No hydronephrosis seen on today's study. Increased cortical echogenicity is consistent with medical renal disease."  Renal dosing meds Outpatient urology follow up recommended.  Hypokalemia/hypomagnesemia Replace K and mag prn  Metabolic acidosis: Improving, continue sodium bicarb.  Acute on chronic  anemia, Anemia of chronic disease Had bled after removal of old Foley, bleeding has stopped Hemoglobin drift down , he appeared pale and weak , family reported history of iron infusion  S/p 1 units PRBC on 2/24, hgb with appropriate improvement,   give IV iron today .  Resume b12/folate supplement.  History of hypothyroidism, continue Synthroid  History of prostate cancer status post hormonal therapy  Diet: Dysphagia 2 (Fine chop);Thin liquid   Liquid Administration via: Cup;Straw Medication Administration: Whole meds with puree Supervision: Patient able to self feed;Staff to assist with self feeding;Intermittent supervision to cue for compensatory strategies Compensations: Slow rate;Small sips/bites Postural Changes: Seated upright at 90 degrees   Code Status: DNR  Family Communication: patient and daughter at bedside  Disposition Plan:   SNF placement    Consultants:  urology Dr Alinda Money  Procedures:  Foley replacement on 2/20 ( old foley removal,  New coude 28fr inserted on 2/20)   Antibiotics:  Cefepime from admission to 2/21  Omnicef from 2/21   Objective: BP 140/66 (BP Location: Right Arm)   Pulse 91   Temp 98.2 F (36.8 C) (Axillary)   Resp 18   Ht 5\' 6"  (1.676 m)   Wt 59.9 kg (132 lb)   SpO2 97%   BMI 21.31 kg/m   Intake/Output Summary (Last 24 hours) at 06/11/2017 1453 Last data filed at 06/11/2017 0957 Gross per 24 hour  Intake 635 ml  Output 1251 ml  Net -616 ml   Filed Weights   06/06/17 0352  Weight: 59.9 kg (132 lb)    Exam: Patient is examined daily including today on 06/11/2017, exams remain the same as of yesterday except that has changed  General:  NAD,  not oriented to time but to place and person, appear weak . Foley with clear urine  Cardiovascular: RRR  Respiratory: CTABL  Abdomen: Soft/ND/NT, positive BS  Musculoskeletal: No Edema  Neuro: Weak   Data Reviewed: Basic Metabolic Panel: Recent Labs  Lab  06/07/17 0537 06/08/17 0520 06/09/17 0455 06/10/17 0539 06/11/17 0536  NA 140 139 140 139 138  K 3.7 3.7 3.5 3.4* 3.7  CL 113* 113* 115* 111 108  CO2 19* 18* 19* 20* 21*  GLUCOSE 64* 90 82 83 92  BUN 38* 44* 27* 17 15  CREATININE 1.83* 1.61* 1.11 0.99 0.98  CALCIUM 7.9* 7.8* 7.7* 7.7* 7.7*  MG  --   --   --  1.4* 2.0   Liver Function Tests: Recent Labs  Lab 06/06/17 0324  AST 20  ALT 11*  ALKPHOS 66  BILITOT 0.7  PROT 6.3*  ALBUMIN 3.4*   No results for input(s): LIPASE, AMYLASE in the last 168 hours. No results for input(s): AMMONIA in the last 168 hours. CBC: Recent Labs  Lab 06/06/17 0324 06/07/17 0537 06/08/17 0520 06/09/17 0455 06/10/17 0539 06/11/17 0536  WBC 9.0 11.3* 8.5 6.3 7.8 9.4  NEUTROABS 8.7*  --  7.5 5.1 6.2 7.3  HGB 9.8* 9.5* 8.4* 7.3* 7.6* 9.2*  HCT 30.7* 29.7* 25.6* 22.6* 23.7* 28.9*  MCV 90.3 91.4 90.1 90.8 91.2 85.8  PLT 195 149* 148* 152 169 195   Cardiac Enzymes:   No results for input(s): CKTOTAL, CKMB, CKMBINDEX, TROPONINI in the last 168 hours. BNP (last 3 results) No results for input(s): BNP in the last 8760 hours.  ProBNP (last 3 results) No results for input(s): PROBNP in the last 8760 hours.  CBG: No results for input(s): GLUCAP in the last 168 hours.  Recent Results (from the past 240 hour(s))  Blood Culture (routine x 2)     Status: None (Preliminary result)   Collection Time: 06/06/17  3:24 AM  Result Value Ref Range Status   Specimen Description   Final    BLOOD RIGHT HAND Performed at [facility] [address] [place], [place] [address]    Special Requests   Final    BOTTLES DRAWN AEROBIC AND ANAEROBIC Blood Culture adequate volume Performed at [facility] [address], [place], [place] [address]    Culture   Final    NO GROWTH 4 DAYS Performed at [facility], [place] [address] [place], [place] [address]    Report Status PENDING  Incomplete  Blood Culture  (routine x 2)     Status: None (Preliminary result)   Collection Time: 06/06/17  3:52 AM  Result Value Ref Range Status   Specimen Description   Final    BLOOD RIGHT ANTECUBITAL Performed at [facility] [address] [place], [place] [address]    Special Requests   Final    BOTTLES DRAWN AEROBIC AND ANAEROBIC Blood Culture adequate volume Performed at [facility] [address] [place], [place] [address]    Culture   Final    NO GROWTH 4 DAYS Performed at [facility], [place] [address] [place], [place] [address]    Report Status PENDING  Incomplete  Urine culture     Status: Abnormal   Collection Time: 06/06/17  6:01 AM  Result Value Ref Range Status   Specimen Description   Final    URINE, CLEAN CATCH Performed at [facility]  [place] [address] [place], [place] [address]    Special Requests   Final    NONE Performed at [facility], [place] [address] [place], [place] [address]    Culture MULTIPLE SPECIES PRESENT, SUGGEST RECOLLECTION (A)  Final   Report Status 06/07/2017 FINAL  Final     Studies: US Renal  Result Date: 06/11/2017 CLINICAL DATA:  Sepsis.  Urinary retention. EXAM: RENAL / URINARY TRACT ULTRASOUND COMPLETE COMPARISON:  CT scan June 07, 2017 FINDINGS: Right Kidney: Length: 8.9 cm.  Increased cortical echogenicity Left Kidney: Length: 8.6 cm.  Increased cortical echogenicity Bladder: Decompressed with a Foley catheter. IMPRESSION: No hydronephrosis seen on today's study. Increased cortical echogenicity is consistent with medical renal disease. Electronically Signed   By: Dorise Bullion III M.D   On: 06/11/2017 09:17    Scheduled Meds: . cefdinir  300 mg Oral Daily  . cholecalciferol  2,000 Units Oral Daily  . feeding supplement (ENSURE ENLIVE)  237 mL Oral Q24H  . folic acid  1 mg Oral Daily  . levothyroxine  25 mcg Oral QAC breakfast  . saccharomyces boulardii  250 mg  Oral BID  . sodium bicarbonate  650 mg Oral Q1200  . vitamin B-12  500 mcg Oral Daily    Continuous Infusions:    Time spent: 25 mins,  Case discussed with family and Education officer, museum. I have personally reviewed and interpreted on  06/11/2017 daily labs, tele strips, imagings as discussed above under date review session and assessment and plans.  I reviewed all nursing notes, pharmacy notes, consultant notes,  vitals, pertinent old records  I have discussed plan of care as described above with RN , patient and family on 06/11/2017   Florencia Reasons MD, PhD  [facility] Pager [phone]. If 7PM-7AM, please contact night-coverage at [email or web address], password [facility] 06/11/2017, 2:53 PM  LOS: 5 days

## 2017-06-12 DIAGNOSIS — N3001 Acute cystitis with hematuria: Secondary | ICD-10-CM | POA: Diagnosis not present

## 2017-06-12 DIAGNOSIS — R262 Difficulty in walking, not elsewhere classified: Secondary | ICD-10-CM | POA: Diagnosis not present

## 2017-06-12 DIAGNOSIS — N1330 Unspecified hydronephrosis: Secondary | ICD-10-CM

## 2017-06-12 DIAGNOSIS — L8990 Pressure ulcer of unspecified site, unspecified stage: Secondary | ICD-10-CM | POA: Diagnosis not present

## 2017-06-12 DIAGNOSIS — N183 Chronic kidney disease, stage 3 (moderate): Secondary | ICD-10-CM | POA: Diagnosis not present

## 2017-06-12 DIAGNOSIS — N138 Other obstructive and reflux uropathy: Secondary | ICD-10-CM | POA: Diagnosis not present

## 2017-06-12 DIAGNOSIS — Z66 Do not resuscitate: Secondary | ICD-10-CM | POA: Diagnosis not present

## 2017-06-12 DIAGNOSIS — A419 Sepsis, unspecified organism: Secondary | ICD-10-CM | POA: Diagnosis not present

## 2017-06-12 DIAGNOSIS — E039 Hypothyroidism, unspecified: Secondary | ICD-10-CM | POA: Diagnosis not present

## 2017-06-12 DIAGNOSIS — C61 Malignant neoplasm of prostate: Secondary | ICD-10-CM | POA: Diagnosis not present

## 2017-06-12 DIAGNOSIS — R278 Other lack of coordination: Secondary | ICD-10-CM | POA: Diagnosis not present

## 2017-06-12 DIAGNOSIS — N390 Urinary tract infection, site not specified: Secondary | ICD-10-CM | POA: Diagnosis not present

## 2017-06-12 DIAGNOSIS — D500 Iron deficiency anemia secondary to blood loss (chronic): Secondary | ICD-10-CM | POA: Diagnosis not present

## 2017-06-12 DIAGNOSIS — R319 Hematuria, unspecified: Secondary | ICD-10-CM | POA: Diagnosis not present

## 2017-06-12 DIAGNOSIS — R1311 Dysphagia, oral phase: Secondary | ICD-10-CM | POA: Diagnosis not present

## 2017-06-12 DIAGNOSIS — Z9289 Personal history of other medical treatment: Secondary | ICD-10-CM | POA: Diagnosis not present

## 2017-06-12 DIAGNOSIS — R1310 Dysphagia, unspecified: Secondary | ICD-10-CM | POA: Diagnosis not present

## 2017-06-12 DIAGNOSIS — G9340 Encephalopathy, unspecified: Secondary | ICD-10-CM | POA: Diagnosis not present

## 2017-06-12 DIAGNOSIS — M6281 Muscle weakness (generalized): Secondary | ICD-10-CM | POA: Diagnosis not present

## 2017-06-12 LAB — BPAM RBC
BLOOD PRODUCT EXPIRATION DATE: 201903092359
ISSUE DATE / TIME: 201902231505
UNIT TYPE AND RH: 6200

## 2017-06-12 LAB — TYPE AND SCREEN
ABO/RH(D): A POS
Antibody Screen: NEGATIVE
Unit division: 0

## 2017-06-12 MED ORDER — CEFDINIR 125 MG/5ML PO SUSR
300.0000 mg | Freq: Two times a day (BID) | ORAL | Status: DC
  Filled 2017-06-12: qty 15

## 2017-06-12 MED ORDER — CEFDINIR 300 MG PO CAPS: 300.0000 mg | ORAL_CAPSULE | Freq: Two times a day (BID) | ORAL | 0 refills | Status: AC

## 2017-06-12 MED ORDER — SODIUM BICARBONATE 650 MG PO TABS: 650.0000 mg | ORAL_TABLET | Freq: Every day | ORAL | 0 refills | Status: DC

## 2017-06-12 MED ORDER — SACCHAROMYCES BOULARDII 250 MG PO CAPS: 250.0000 mg | ORAL_CAPSULE | Freq: Two times a day (BID) | ORAL | 0 refills | Status: DC

## 2017-06-12 NOTE — Progress Notes (Signed)
Nutrition Follow-up  DOCUMENTATION CODES:   Not applicable  INTERVENTION:  - Continue Ensure Enlive once/day and snacks BID (10 AM and 2 PM). - Continue to encourage PO intakes.   NUTRITION DIAGNOSIS:   Increased nutrient needs related to acute illness, chronic illness(prostate cancer) as evidenced by estimated needs. -ongoing  GOAL:   Patient will meet greater than or equal to 90% of their needs -likely meeting on average  MONITOR:   PO intake, Supplement acceptance, Labs, Weight trends, I & O's, Skin  ASSESSMENT:   Pt with PMH of prostate cancer managed non-operatively with a chronic foley catheter resulting in frequent UTIs presents with sepsis likely r/t UTI   Weight today is +4 lbs/2.2 kg compared to admission weight. Estimated nutrition needs remain appropriate. Per review, pt consumed 75% of breakfast, 60% of lunch, and 80% of dinner (total of 1135 kcal, 48 grams of protein) on yesterday. He ate 25% of breakfast this AM (166 kcal, 5 grams of protein). Intakes have been 50-75% on average. Ensure Enlive ordered once/day and pt has accepted 3/5 bottles offered since order was placed. Snacks ordered at 10 AM and 2 PM (total of 452 kcal, 31 grams of protein). He was ordered Heart Healthy diet at time of admission but downgraded to Dysphagia 2, thin liquids on 2/22.  Pt is a/o to self and place. Plan is for SNF at time of d/c. Per Dr. Phoebe Sharps note yesterday PM, mentation is now at baseline, bilateral hydronephrosis on 2/20.   Medications reviewed; 2000 units vitamin D/day, 500 mcg cyanocobalamin/day, 1 mg oral folic acid/day, 25 mcg oral Synthroid/day, 250 mg Florastor BID, 650 mg sodium bicarb/day.  Labs reviewed; Ca: 7.7 mg/dL.    Diet Order:  DIET DYS 2 Room service appropriate? Yes; Fluid consistency: Thin  EDUCATION NEEDS:   Not appropriate for education at this time  Skin:  Skin Assessment: Skin Integrity Issues: Skin Integrity Issues:: Stage II Stage II:  sacrum  Last BM:  2/24  Height:   Ht Readings from Last 1 Encounters:  06/06/17 5\' 6"  (1.676 m)    Weight:   Wt Readings from Last 1 Encounters:  06/12/17 136 lb 14.4 oz (62.1 kg)    Ideal Body Weight:  64.5 kg  BMI:  Body mass index is 22.1 kg/m.  Estimated Nutritional Needs:   Kcal:  1500-1700  Protein:  70-80 grams  Fluid:  >/= 1.5 L/d      Jarome Matin, MS, RD, LDN, [facility] Inpatient Clinical Dietitian Pager # [phone] After hours/weekend pager # [phone]

## 2017-06-12 NOTE — Care Management Note (Signed)
Case Management Note  Patient Details  Name: Anthony Nicholson MRN: [id number] Date of Birth: 09-20-1917  Subjective/Objective: d/c SNF                   Action/Plan:d/c SNF   Expected Discharge Date:  06/12/17               Expected Discharge Plan:  Skilled Nursing Facility(with Home Health)  In-House Referral:     Discharge planning Services     Post Acute Care Choice:  Home Health(Active w/[facility] @ home HHRN/HHPT) Choice offered to:  Patient  DME Arranged:    DME Agency:     HH Arranged:    [place] Agency:     Status of Service:  Completed, signed off  If discussed at [employer], dates discussed:    Additional Comments:  Dessa Phi, RN 06/12/2017, 2:43 PM

## 2017-06-12 NOTE — Progress Notes (Signed)
Patient had a 16 beat run of SVT while sleeping. Patient with no complaints and vitals were stable. NP on call was notified via text page. Will continue to monitor patient.

## 2017-06-12 NOTE — Discharge Summary (Signed)
Discharge Summary  Anthony Nicholson [id number] DOB: 05/21/1917  [facility]: [facility]  Admit date: 06/06/2017 Discharge date: 06/12/2017  Time spent: >84mins, more than 50% time spent on coordination of care  Recommendations for Outpatient Follow-up:  1. F/u with PMD within a week  for hospital discharge follow up, repeat cbc/bmp at follow up 2. F/u with urology  Discharge Diagnoses:  Active Hospital Problems   Diagnosis Date Noted  . Sepsis ([place]) 12/09/2014  . Pressure injury of skin 06/07/2017  . Hematuria 06/06/2017  . Acute encephalopathy 06/06/2017  . CKD (chronic kidney disease) stage 3, GFR 30-59 ml/min (HCC) 06/06/2017  . Lower urinary tract infectious disease 12/09/2014  . Adenocarcinoma of prostate ([place]) 10/03/2014  . Urinary retention     Resolved Hospital Problems  No resolved problems to display.    Discharge Condition: stable  Diet recommendation:   Dysphagia 2 (Fine chop);Thin liquid   Liquid Administration via: Cup;Straw Medication Administration: Whole meds with puree Supervision: Patient able to self feed;Staff to assist with self feeding;Intermittent supervision to cue for compensatory strategies Compensations: Slow rate;Small sips/bites Postural Changes: Seated upright at 90 degrees   Filed Weights   06/06/17 0352 06/12/17 0625  Weight: 59.9 kg (132 lb) 62.1 kg (136 lb 14.4 oz)    History of present illness: (per admitting MD Dr Alcario Drought) [facility]CP: [facility]  Patient coming from: Home   Chief Complaint: Catheter Pain  HPI: Anthony Nicholson is a 84 y.o. male with medical history significant of Prostate CA managed non-op given patients age.  Urinary retention managed with chronic foley for past 4 years.  Frequent UTIs which usually grow out poly-microbial organisms on cultures.  Patient had blocked foley yesterday.  Foley changed out at that time.  Hematuria since that time with pain.  In to ED today.  Patient confused and  apparently off of baseline right now per family.  At baseline patient lives independently still despite age.  Has home health RN come to change foley's.  Takes synthroid.  But that's all.   ED Course: Tm 103.8, tachycardia to 120, improves to 91 after 7ml /kg bolus.  WBC 9k.  Creat 1.37 (seems to be about baseline).  CXR neg.  UA pending.     Hospital Course:  Principal Problem:   Sepsis [facility]) Active Problems:   Urinary retention   Adenocarcinoma of prostate ([place])   Lower urinary tract infectious disease   Hematuria   Acute encephalopathy   CKD (chronic kidney disease) stage 3, GFR 30-59 ml/min (HCC)   Pressure injury of skin   Sepsis presented on presentation with fever 103.8, tachycardia heart rate 113 , tachypnea respiration rate 24-26 , hypotension systolic blood pressure in the 90s Presumed source from urine with chronic indwelling Foley catheter, last changed on Monday Due to complaining of abdominal pain, CT renal stone obtained, showed Foley malpositioning replaced new foley  on 2/20 blood culture no growth, urine culture with multiple species,  He received cefepime initially, antibiotic change to Cefdinir ,he is discharged on cefdinir for another three days to finish total of 10 days of antibiotic treatment.  Urology consulted, input appreciated.  Acute metabolic encephalopathy presented on admission  Improved, now at baseline He is not oriented to year, per daughter this is at baseline . Patient has some sundowning symptoms in the evening Daughter report patient previously lives by himself  Improving   AK I on CKD 3:  Likely from mild position of Foley catheter CT renal stone showed  bilateral hydronephrosis on 2/20 Foley exchanged,  BUN and creatinine normalized,  repeat renal ultrasound on 2/24 :No hydronephrosis seen on today's study. Increased cortical echogenicity is consistent with medical renal disease."  Renal dosing meds Outpatient urology follow up  recommended.  Hypokalemia/hypomagnesemia K and mag replaced and normalized   Metabolic acidosis: he is discharged on sodium bicarb.  Acute on chronic anemia, Anemia of chronic disease Had bled after removal of old Foley, bleeding has stopped Hemoglobin drift down , he appeared pale and weak , family reported history of iron infusion  S/p 1 units PRBC on 2/24, hgb with appropriate improvement,   s/ IV iron on 2/24. Resume b12/folate supplement.  History of hypothyroidism, continue Synthroid  History of prostate cancer status post hormonal therapy  Diet: Dysphagia 2 (Fine chop);Thin liquid  Liquid Administration via: Cup;Straw Medication Administration: Whole meds with puree Supervision: Patient able to self feed;Staff to assist with self feeding;Intermittent supervision to cue for compensatory strategies Compensations: Slow rate;Small sips/bites Postural Changes: Seated upright at 90 degrees  Code Status: DNR  Family Communication: patient and daughter at bedside  Disposition Plan:   SNF placement    Consultants:  urology Dr Alinda Money  Procedures:  Foley replacement on 2/20 ( old foley removal,  New coude 40fr inserted on 2/20)   Antibiotics:  Cefepime from admission to 2/21  Omnicef from 2/21   Discharge Exam: BP (!) 107/57 (BP Location: Left Arm)   Pulse 89   Temp 97.8 F (36.6 C) (Oral)   Resp 20   Ht 5\' 6"  (1.676 m)   Wt 62.1 kg (136 lb 14.4 oz)   SpO2 95%   BMI 22.10 kg/m    General:  NAD,  not oriented to time but to place and person, appear weak . Foley with clear urine  Cardiovascular: RRR  Respiratory: CTABL  Abdomen: Soft/ND/NT, positive BS  Musculoskeletal: No Edema  Neuro: Weak    Discharge Instructions You were cared for by a hospitalist during your hospital stay. If you have any questions about your discharge medications or the care you received while you were in the hospital after you are discharged, you can  call the unit and asked to speak with the hospitalist on call if the hospitalist that took care of you is not available. Once you are discharged, your primary care physician will handle any further medical issues. Please note that NO REFILLS for any discharge medications will be authorized once you are discharged, as it is imperative that you return to your primary care physician (or establish a relationship with a primary care physician if you do not have one) for your aftercare needs so that they can reassess your need for medications and monitor your lab values.  Discharge Instructions    Diet general   Complete by:  As directed    Dysphagia 2 (Fine chop);Thin liquid   Liquid Administration via: Cup;Straw Medication Administration: Whole meds with puree Supervision: Patient able to self feed;Staff to assist with self feeding;Intermittent supervision to cue for compensatory strategies Compensations: Slow rate;Small sips/bites Postural Changes: Seated upright at 90 degrees   Increase activity slowly   Complete by:  As directed    Increase activity slowly   Complete by:  As directed      Allergies as of 06/12/2017   No Known Allergies     Medication List    STOP taking these medications   amoxicillin 500 MG capsule Commonly known as:  AMOXIL   polyethylene glycol packet  Commonly known as:  MIRALAX / GLYCOLAX   senna-docusate 8.6-50 MG tablet Commonly known as:  Senokot-S   tamsulosin 0.4 MG Caps capsule Commonly known as:  FLOMAX     TAKE these medications   carboxymethylcellulose 0.5 % Soln Commonly known as:  REFRESH PLUS Place 1 drop into both eyes 3 (three) times daily as needed (dry eye).   cefdinir 300 MG capsule Commonly known as:  OMNICEF Take 1 capsule (300 mg total) by mouth 2 (two) times daily for 3 days.   cholecalciferol 1000 units tablet Commonly known as:  VITAMIN D Take 2,000 Units by mouth daily.   clotrimazole 1 % cream Commonly known as:   LOTRIMIN Apply 1 application topically 2 (two) times daily.   ENSURE Take 237 mLs by mouth daily.   eucerin cream Apply 1 application topically as needed for dry skin.   folic acid 1 MG tablet Commonly known as:  FOLVITE Take 1 mg by mouth daily.   levothyroxine 25 MCG tablet Commonly known as:  SYNTHROID, LEVOTHROID Take 25 mcg by mouth daily before breakfast.   MUSCLE RUB 10-15 % Crea Apply 1 application topically as needed for muscle pain.   polymixin-bacitracin 500-10000 UNIT/GM Oint ointment Apply 1 application topically every other day.   saccharomyces boulardii 250 MG capsule Commonly known as:  FLORASTOR Take 1 capsule (250 mg total) by mouth 2 (two) times daily.   sodium bicarbonate 650 MG tablet Take 1 tablet (650 mg total) by mouth daily at 12 noon. Start taking on:  06/13/2017   vitamin B-12 500 MCG tablet Commonly known as:  CYANOCOBALAMIN Take 500 mcg by mouth daily.      No Known Allergies  Contact information for follow-up providers    Irine Seal, MD Follow up in 2 week(s).   Specialty:  Urology Contact information: [place] [address] [phone]        [facility] Follow up in 1 week(s).   Specialty:  General Practice Why:  hospital discharge follow up repeat cbc/bmp at follow up Contact information: [place] [id number] [phone]            Contact information for after-discharge care    Destination    [facility] Follow up.   Service:  Skilled Nursing Contact information: [place] [phone]                   The results of significant diagnostics from this hospitalization (including imaging, microbiology, ancillary and laboratory) are listed below for reference.    Significant Diagnostic Studies: Dg Chest 2 View  Result Date: 06/06/2017 CLINICAL DATA:  Sepsis and fever EXAM: CHEST  2 VIEW  COMPARISON:  12/11/2014 FINDINGS: No consolidation or pleural effusion. Cardiomediastinal silhouette within normal limits. Aortic atherosclerosis. No pneumothorax. Degenerative changes of the spine. IMPRESSION: No active cardiopulmonary disease. Electronically Signed   By: Donavan Foil M.D.   On: 06/06/2017 03:46   US Renal  Result Date: 06/11/2017 CLINICAL DATA:  Sepsis.  Urinary retention. EXAM: RENAL / URINARY TRACT ULTRASOUND COMPLETE COMPARISON:  CT scan June 07, 2017 FINDINGS: Right Kidney: Length: 8.9 cm.  Increased cortical echogenicity Left Kidney: Length: 8.6 cm.  Increased cortical echogenicity Bladder: Decompressed with a Foley catheter. IMPRESSION: No hydronephrosis seen on today's study. Increased cortical echogenicity is consistent with medical renal disease. Electronically Signed   By: Dorise Bullion III M.D   On: 06/11/2017 09:17  US Renal  Result Date: 06/06/2017 CLINICAL DATA:  84 year old with urinary tract infection. EXAM: RENAL / URINARY TRACT ULTRASOUND COMPLETE COMPARISON:  Renal ultrasound 12/09/2014 FINDINGS: Right Kidney: Length: 9.8 cm. Mild hydronephrosis, slightly improved from prior exam. Progressive renal parenchymal thinning. No discrete renal lesion. Left Kidney: Not visualized due to bowel gas. Patient did not tolerate change in positioning. Bladder: Questionable debris in the bladder. IMPRESSION: 1. Mild right hydronephrosis, chronic and slightly improved from prior exam. Progressive renal parenchymal thinning from 2016 ultrasound. 2. Questionable debris in the urinary bladder, in keeping with urinary tract infection. 3. Left kidney not visualized due to bowel gas. Electronically Signed   By: Jeb Levering M.D.   On: 06/06/2017 06:18   Ct Renal Stone Study  Result Date: 06/07/2017 CLINICAL DATA:  Inpatient. Acute right lower quadrant abdominal pain, fever and chills. Hematuria. History of prostate cancer. Urinary retention with chronic indwelling Foley  catheter. Recurrent urinary tract infection. EXAM: CT ABDOMEN AND PELVIS WITHOUT CONTRAST TECHNIQUE: Multidetector CT imaging of the abdomen and pelvis was performed following the standard protocol without IV contrast. COMPARISON:  06/06/2017 renal sonogram. 10/03/2014 CT abdomen/pelvis. FINDINGS: Motion degraded scan, limiting assessment. Lower chest: Small dependent bilateral pleural effusions, right greater than left. Stable calcified dependent left pleural plaque. A few scattered tiny solid pulmonary nodules at the right lung base, largest 3 mm in peripheral right lower lobe, all stable since 10/03/2014, probably benign. Mild dependent atelectasis at both lung bases. Coronary atherosclerosis. Hepatobiliary: Normal liver size. Stable tiny calcified inferior right liver lobe granuloma. No liver masses. Normal gallbladder with no radiopaque cholelithiasis. No biliary ductal dilatation. Pancreas: Normal, with no mass or duct dilation. Spleen: Normal size. No mass. Adrenals/Urinary Tract: Normal adrenals. Nonobstructing punctate 2 mm lower left renal stone. No additional stones in the kidneys. Chronic bilateral hydroureteronephrosis to the level of the ureterovesical junctions, moderate on the right and mild on the left, not appreciably changed compared to the 10/03/2014 CT study. There are several (at least 5) tiny layering stones in the bladder, largest 3 mm on the left, some of which are at or in close proximity to the ureterovesical junctions bilaterally. No contour deforming renal masses. Prominent diffuse bladder wall thickening and trabeculation with several small bilateral bladder diverticula, largest 1.6 cm in superior right bladder wall, not appreciably changed. There is prominent perivesical fat stranding with ill-defined perivesical fluid. Bladder is mildly distended. Stomach/Bowel: Moderate hiatal hernia is stable. Otherwise normal nondistended stomach. Normal caliber small bowel with no small bowel wall  thickening. Normal appendix. Marked colonic diverticulosis, most prominent in the sigmoid colon, with no large bowel wall thickening. Vascular/Lymphatic: Atherosclerotic abdominal aorta with ectatic 2.6 cm infrarenal abdominal aorta, stable. No pathologically enlarged lymph nodes in the abdomen or pelvis. Reproductive: Malpositioned Foley catheter with the Foley balloon dilated within the bulbar urethra (series 2/image 82) and the Foley tip within the prostatic urethra. Normal size prostate. Other: No pneumoperitoneum. Trace free fluid in the deep pelvis. No measurable fluid collections. Musculoskeletal: No aggressive appearing focal osseous lesions. Chronic mild L1 vertebral compression fracture. Marked thoracolumbar spondylosis. Chronic occult intrasacral meningocele measuring 2.7 cm, not appreciably changed. IMPRESSION: 1. Malpositioned Foley catheter with the Foley balloon dilated within the bulbar urethra. 2. Nonspecific prominent diffuse bladder wall thickening and trabeculation with small bladder diverticula with perivesical fat stranding and ill-defined fluid, worrisome for acute cystitis superimposed on chronic bladder wall thickening due to chronic bladder outlet obstruction. Several tiny layering bladder stones, some located at or in  close proximity to the ureterovesical junctions bilaterally. 3. Chronic bilateral hydroureteronephrosis, moderate on the right and mild on the left. Nonobstructing punctate left renal stone. 4. Small dependent bilateral pleural effusions, right greater than left. Calcified pleural plaque on the left. Cannot exclude asbestos related pleural disease. 5. Chronic findings include: Aortic Atherosclerosis (ICD10-I70.0). Stable ectatic 2.6 cm infrarenal abdominal aorta. Coronary atherosclerosis. Marked colonic diverticulosis. Moderate hiatal hernia. These results were called by telephone at the time of interpretation on 06/07/2017 at 4:53 pm to Dr. Florencia Reasons , who verbally acknowledged  these results. Electronically Signed   By: Ilona Sorrel M.D.   On: 06/07/2017 16:55    Microbiology: Recent Results (from the past 240 hour(s))  Blood Culture (routine x 2)     Status: None   Collection Time: 06/06/17  3:24 AM  Result Value Ref Range Status   Specimen Description   Final    BLOOD RIGHT HAND Performed at [facility], [place] [address] [place], [place] [address]    Special Requests   Final    BOTTLES DRAWN AEROBIC AND ANAEROBIC Blood Culture adequate volume Performed at [place] [address] [place], [place] [address]    Culture   Final    NO GROWTH 5 DAYS Performed at [facility], [place] [address] [place], [place] [address]    Report Status 06/11/2017 FINAL  Final  Blood Culture (routine x 2)     Status: None   Collection Time: 06/06/17  3:52 AM  Result Value Ref Range Status   Specimen Description   Final    BLOOD RIGHT ANTECUBITAL Performed at [facility] [address], [place], [place] [address]    Special Requests   Final    BOTTLES DRAWN AEROBIC AND ANAEROBIC Blood Culture adequate volume Performed at [facility] [address], [place], [place] [address]    Culture   Final    NO GROWTH 5 DAYS Performed at [facility], [place] [address], [place], [place] [address]    Report Status 06/11/2017 FINAL  Final  Urine culture     Status: Abnormal   Collection Time: 06/06/17  6:01 AM  Result Value Ref Range Status   Specimen Description   Final    URINE, CLEAN CATCH Performed at [facility], [place] [address] [place], [place] [address]    Special Requests   Final    NONE Performed at [facility], [place] [address], [place],  [address]    Culture MULTIPLE SPECIES PRESENT, SUGGEST RECOLLECTION (A)  Final   Report Status 06/07/2017 FINAL  Final     Labs: Basic Metabolic Panel: Recent Labs  Lab 06/07/17 0537  06/08/17 0520 06/09/17 0455 06/10/17 0539 06/11/17 0536  NA 140 139 140 139 138  K 3.7 3.7 3.5 3.4* 3.7  CL 113* 113* 115* 111 108  CO2 19* 18* 19* 20* 21*  GLUCOSE 64* 90 82 83 92  BUN 38* 44* 27* 17 15  CREATININE 1.83* 1.61* 1.11 0.99 0.98  CALCIUM 7.9* 7.8* 7.7* 7.7* 7.7*  MG  --   --   --  1.4* 2.0   Liver Function Tests: Recent Labs  Lab 06/06/17 0324  AST 20  ALT 11*  ALKPHOS 66  BILITOT 0.7  PROT 6.3*  ALBUMIN 3.4*   No results for input(s): LIPASE, AMYLASE in the last 168 hours. No results for input(s): AMMONIA in the last 168  hours. CBC: Recent Labs  Lab 06/06/17 0324 06/07/17 0537 06/08/17 0520 06/09/17 0455 06/10/17 0539 06/11/17 0536  WBC 9.0 11.3* 8.5 6.3 7.8 9.4  NEUTROABS 8.7*  --  7.5 5.1 6.2 7.3  HGB 9.8* 9.5* 8.4* 7.3* 7.6* 9.2*  HCT 30.7* 29.7* 25.6* 22.6* 23.7* 28.9*  MCV 90.3 91.4 90.1 90.8 91.2 85.8  PLT 195 149* 148* 152 169 195   Cardiac Enzymes: No results for input(s): CKTOTAL, CKMB, CKMBINDEX, TROPONINI in the last 168 hours. BNP: BNP (last 3 results) No results for input(s): BNP in the last 8760 hours.  ProBNP (last 3 results) No results for input(s): PROBNP in the last 8760 hours.  CBG: No results for input(s): GLUCAP in the last 168 hours.     Signed:  Florencia Reasons MD, PhD  [facility] 06/12/2017, 1:10 PM

## 2017-06-12 NOTE — Clinical Social Work Placement (Signed)
Patient received and accepted bed offer at [facility]. Facility aware of patient's discharge and confirmed bed offer. Patient's insurance authorization pending, 5 day LOG for authorization approved by [place] Surveyor, quantity. PTAR contacted, patient's family notified. Patient's RN can call report to [phone], packet complete. CSW signing off, no other needs identified at this time.  CLINICAL SOCIAL WORK PLACEMENT  NOTE  Date:  06/12/2017  Patient Details  Name: Anthony Nicholson MRN: [id number] Date of Birth: [birth year]  Clinical Social Work is seeking post-discharge placement for this patient at the [place] level of care (*CSW will initial, date and re-position this form in  chart as items are completed):  Yes   Patient/family provided with [place] Work Department's list of facilities offering this level of care within the geographic area requested by the patient (or if unable, by the patient's family).  Yes   Patient/family informed of their freedom to choose among providers that offer the needed level of care, that participate in Medicare, Medicaid or managed care program needed by the patient, have an available bed and are willing to accept the patient.  Yes   Patient/family informed of [facility]'s ownership interest in [facility] and [facility], as well as of the fact that they are under no obligation to receive care at these facilities.  PASRR submitted to EDS on       PASRR number received on       Existing PASRR number confirmed on 06/11/17     FL2 transmitted to all facilities in geographic area requested by pt/family on 06/11/17     FL2 transmitted to all facilities within larger geographic area on       Patient informed that his/her managed care company has contracts with or will negotiate with certain facilities, including the following:        Yes   Patient/family informed of bed offers received.  Patient chooses bed  at [facility]     Physician recommends and patient chooses bed at      Patient to be transferred to [facility] on 06/12/17.  Patient to be transferred to facility by PTAR     Patient family notified on 06/12/17 of transfer.  Name of family member notified:  [name] Duncan/Van Damita Dunnings     PHYSICIAN       Additional Comment:    _______________________________________________ Burnis Medin, LCSW 06/12/2017, 4:55 PM

## 2017-06-13 DIAGNOSIS — A419 Sepsis, unspecified organism: Secondary | ICD-10-CM | POA: Diagnosis not present

## 2017-06-13 DIAGNOSIS — N138 Other obstructive and reflux uropathy: Secondary | ICD-10-CM | POA: Diagnosis not present

## 2017-06-13 DIAGNOSIS — E039 Hypothyroidism, unspecified: Secondary | ICD-10-CM | POA: Diagnosis not present

## 2017-06-13 DIAGNOSIS — C61 Malignant neoplasm of prostate: Secondary | ICD-10-CM | POA: Diagnosis not present

## 2017-06-14 DIAGNOSIS — G9340 Encephalopathy, unspecified: Secondary | ICD-10-CM | POA: Diagnosis not present

## 2017-06-14 DIAGNOSIS — R1310 Dysphagia, unspecified: Secondary | ICD-10-CM | POA: Diagnosis not present

## 2017-06-14 DIAGNOSIS — Z66 Do not resuscitate: Secondary | ICD-10-CM | POA: Diagnosis not present

## 2017-06-14 DIAGNOSIS — N390 Urinary tract infection, site not specified: Secondary | ICD-10-CM | POA: Diagnosis not present

## 2017-06-14 DIAGNOSIS — R319 Hematuria, unspecified: Secondary | ICD-10-CM | POA: Diagnosis not present

## 2017-06-14 DIAGNOSIS — C61 Malignant neoplasm of prostate: Secondary | ICD-10-CM | POA: Diagnosis not present

## 2017-06-14 DIAGNOSIS — Z9289 Personal history of other medical treatment: Secondary | ICD-10-CM | POA: Diagnosis not present

## 2017-06-14 DIAGNOSIS — D500 Iron deficiency anemia secondary to blood loss (chronic): Secondary | ICD-10-CM | POA: Diagnosis not present

## 2017-06-20 DIAGNOSIS — N138 Other obstructive and reflux uropathy: Secondary | ICD-10-CM | POA: Diagnosis not present

## 2017-06-20 DIAGNOSIS — C61 Malignant neoplasm of prostate: Secondary | ICD-10-CM | POA: Diagnosis not present

## 2017-06-20 DIAGNOSIS — E039 Hypothyroidism, unspecified: Secondary | ICD-10-CM | POA: Diagnosis not present

## 2017-06-20 DIAGNOSIS — A419 Sepsis, unspecified organism: Secondary | ICD-10-CM | POA: Diagnosis not present

## 2017-06-27 DIAGNOSIS — N138 Other obstructive and reflux uropathy: Secondary | ICD-10-CM | POA: Diagnosis not present

## 2017-06-27 DIAGNOSIS — E039 Hypothyroidism, unspecified: Secondary | ICD-10-CM | POA: Diagnosis not present

## 2017-06-27 DIAGNOSIS — N183 Chronic kidney disease, stage 3 (moderate): Secondary | ICD-10-CM | POA: Diagnosis not present

## 2017-06-27 DIAGNOSIS — C61 Malignant neoplasm of prostate: Secondary | ICD-10-CM | POA: Diagnosis not present

## 2017-07-06 ENCOUNTER — Other Ambulatory Visit: Payer: Self-pay

## 2017-07-06 NOTE — Patient Outreach (Signed)
[facility]) Care Management  07/06/2017  Anthony Nicholson May 13, 1917 [id number]   Referral Date: 07/06/17 Referral Source: [employer] Report Date of Admission: ?  Diagnosis: ? Date of Discharge: 07/04/17 Facility: [place]: [place] attempt # 1 to patient. No answer.  HIPAA compliant voice message left.    Plan: RN CM will send letter and attempt within four business days.   Jone Baseman, RN, MSN [facility] Care Management Care Management Coordinator Direct Line [phone] Toll Free: [phone]  Fax: [phone]

## 2017-07-07 ENCOUNTER — Other Ambulatory Visit: Payer: Self-pay

## 2017-07-07 DIAGNOSIS — C61 Malignant neoplasm of prostate: Secondary | ICD-10-CM | POA: Diagnosis not present

## 2017-07-07 DIAGNOSIS — N138 Other obstructive and reflux uropathy: Secondary | ICD-10-CM | POA: Diagnosis not present

## 2017-07-07 DIAGNOSIS — N183 Chronic kidney disease, stage 3 (moderate): Secondary | ICD-10-CM | POA: Diagnosis not present

## 2017-07-07 DIAGNOSIS — W19XXXD Unspecified fall, subsequent encounter: Secondary | ICD-10-CM | POA: Diagnosis not present

## 2017-07-07 NOTE — Patient Outreach (Signed)
[facility]) Care Management  07/07/2017  Anthony Nicholson April 25, 1917 [id number]     Incoming call from [place].  He is able to verify HIPAA.  He states that patient has been discharged from [place] to [place] at the facility as patient is too weak to live on his own as before.  He states that patient still has some weakness and he is unable to care for his foley on his own. He also verifies that patient has the [place] as his primary physician.  He states that the family has no plans to take him out of the facility.   He voices no concerns at this time for [facility] Care Management.  Plan: RN CM will close at this time.   Jone Baseman, RN, MSN [facility] Care Management Care Management Coordinator Direct Line [phone] Toll Free: [phone]  Fax: [phone]

## 2017-07-10 ENCOUNTER — Ambulatory Visit: Payer: Medicare HMO

## 2017-07-17 DIAGNOSIS — C61 Malignant neoplasm of prostate: Secondary | ICD-10-CM | POA: Diagnosis not present

## 2017-07-17 DIAGNOSIS — N390 Urinary tract infection, site not specified: Secondary | ICD-10-CM | POA: Diagnosis not present

## 2017-07-17 DIAGNOSIS — R1311 Dysphagia, oral phase: Secondary | ICD-10-CM | POA: Diagnosis not present

## 2017-07-17 DIAGNOSIS — R262 Difficulty in walking, not elsewhere classified: Secondary | ICD-10-CM | POA: Diagnosis not present

## 2017-07-17 DIAGNOSIS — N183 Chronic kidney disease, stage 3 (moderate): Secondary | ICD-10-CM | POA: Diagnosis not present

## 2017-07-17 DIAGNOSIS — R278 Other lack of coordination: Secondary | ICD-10-CM | POA: Diagnosis not present

## 2017-07-17 DIAGNOSIS — L8990 Pressure ulcer of unspecified site, unspecified stage: Secondary | ICD-10-CM | POA: Diagnosis not present

## 2017-07-17 DIAGNOSIS — M6281 Muscle weakness (generalized): Secondary | ICD-10-CM | POA: Diagnosis not present

## 2017-07-17 DIAGNOSIS — G9340 Encephalopathy, unspecified: Secondary | ICD-10-CM | POA: Diagnosis not present

## 2017-07-18 DIAGNOSIS — C61 Malignant neoplasm of prostate: Secondary | ICD-10-CM | POA: Diagnosis not present

## 2017-07-18 DIAGNOSIS — L8990 Pressure ulcer of unspecified site, unspecified stage: Secondary | ICD-10-CM | POA: Diagnosis not present

## 2017-07-18 DIAGNOSIS — R262 Difficulty in walking, not elsewhere classified: Secondary | ICD-10-CM | POA: Diagnosis not present

## 2017-07-18 DIAGNOSIS — M6281 Muscle weakness (generalized): Secondary | ICD-10-CM | POA: Diagnosis not present

## 2017-07-18 DIAGNOSIS — N390 Urinary tract infection, site not specified: Secondary | ICD-10-CM | POA: Diagnosis not present

## 2017-07-18 DIAGNOSIS — G9340 Encephalopathy, unspecified: Secondary | ICD-10-CM | POA: Diagnosis not present

## 2017-07-18 DIAGNOSIS — R278 Other lack of coordination: Secondary | ICD-10-CM | POA: Diagnosis not present

## 2017-07-18 DIAGNOSIS — R1311 Dysphagia, oral phase: Secondary | ICD-10-CM | POA: Diagnosis not present

## 2017-07-18 DIAGNOSIS — N183 Chronic kidney disease, stage 3 (moderate): Secondary | ICD-10-CM | POA: Diagnosis not present

## 2017-07-19 DIAGNOSIS — R262 Difficulty in walking, not elsewhere classified: Secondary | ICD-10-CM | POA: Diagnosis not present

## 2017-07-19 DIAGNOSIS — N390 Urinary tract infection, site not specified: Secondary | ICD-10-CM | POA: Diagnosis not present

## 2017-07-19 DIAGNOSIS — M6281 Muscle weakness (generalized): Secondary | ICD-10-CM | POA: Diagnosis not present

## 2017-07-19 DIAGNOSIS — R1311 Dysphagia, oral phase: Secondary | ICD-10-CM | POA: Diagnosis not present

## 2017-07-19 DIAGNOSIS — N183 Chronic kidney disease, stage 3 (moderate): Secondary | ICD-10-CM | POA: Diagnosis not present

## 2017-07-19 DIAGNOSIS — L8990 Pressure ulcer of unspecified site, unspecified stage: Secondary | ICD-10-CM | POA: Diagnosis not present

## 2017-07-19 DIAGNOSIS — C61 Malignant neoplasm of prostate: Secondary | ICD-10-CM | POA: Diagnosis not present

## 2017-07-19 DIAGNOSIS — R278 Other lack of coordination: Secondary | ICD-10-CM | POA: Diagnosis not present

## 2017-07-19 DIAGNOSIS — G9340 Encephalopathy, unspecified: Secondary | ICD-10-CM | POA: Diagnosis not present

## 2017-07-20 DIAGNOSIS — R1311 Dysphagia, oral phase: Secondary | ICD-10-CM | POA: Diagnosis not present

## 2017-07-20 DIAGNOSIS — L8990 Pressure ulcer of unspecified site, unspecified stage: Secondary | ICD-10-CM | POA: Diagnosis not present

## 2017-07-20 DIAGNOSIS — G9340 Encephalopathy, unspecified: Secondary | ICD-10-CM | POA: Diagnosis not present

## 2017-07-20 DIAGNOSIS — C61 Malignant neoplasm of prostate: Secondary | ICD-10-CM | POA: Diagnosis not present

## 2017-07-20 DIAGNOSIS — R278 Other lack of coordination: Secondary | ICD-10-CM | POA: Diagnosis not present

## 2017-07-20 DIAGNOSIS — M6281 Muscle weakness (generalized): Secondary | ICD-10-CM | POA: Diagnosis not present

## 2017-07-20 DIAGNOSIS — N183 Chronic kidney disease, stage 3 (moderate): Secondary | ICD-10-CM | POA: Diagnosis not present

## 2017-07-20 DIAGNOSIS — R262 Difficulty in walking, not elsewhere classified: Secondary | ICD-10-CM | POA: Diagnosis not present

## 2017-07-20 DIAGNOSIS — N390 Urinary tract infection, site not specified: Secondary | ICD-10-CM | POA: Diagnosis not present

## 2017-07-21 DIAGNOSIS — R278 Other lack of coordination: Secondary | ICD-10-CM | POA: Diagnosis not present

## 2017-07-21 DIAGNOSIS — R262 Difficulty in walking, not elsewhere classified: Secondary | ICD-10-CM | POA: Diagnosis not present

## 2017-07-21 DIAGNOSIS — N183 Chronic kidney disease, stage 3 (moderate): Secondary | ICD-10-CM | POA: Diagnosis not present

## 2017-07-21 DIAGNOSIS — C61 Malignant neoplasm of prostate: Secondary | ICD-10-CM | POA: Diagnosis not present

## 2017-07-21 DIAGNOSIS — G9340 Encephalopathy, unspecified: Secondary | ICD-10-CM | POA: Diagnosis not present

## 2017-07-21 DIAGNOSIS — N390 Urinary tract infection, site not specified: Secondary | ICD-10-CM | POA: Diagnosis not present

## 2017-07-21 DIAGNOSIS — L8990 Pressure ulcer of unspecified site, unspecified stage: Secondary | ICD-10-CM | POA: Diagnosis not present

## 2017-07-21 DIAGNOSIS — R1311 Dysphagia, oral phase: Secondary | ICD-10-CM | POA: Diagnosis not present

## 2017-07-21 DIAGNOSIS — M6281 Muscle weakness (generalized): Secondary | ICD-10-CM | POA: Diagnosis not present

## 2017-07-24 DIAGNOSIS — L8990 Pressure ulcer of unspecified site, unspecified stage: Secondary | ICD-10-CM | POA: Diagnosis not present

## 2017-07-24 DIAGNOSIS — G9340 Encephalopathy, unspecified: Secondary | ICD-10-CM | POA: Diagnosis not present

## 2017-07-24 DIAGNOSIS — R262 Difficulty in walking, not elsewhere classified: Secondary | ICD-10-CM | POA: Diagnosis not present

## 2017-07-24 DIAGNOSIS — R1311 Dysphagia, oral phase: Secondary | ICD-10-CM | POA: Diagnosis not present

## 2017-07-24 DIAGNOSIS — N390 Urinary tract infection, site not specified: Secondary | ICD-10-CM | POA: Diagnosis not present

## 2017-07-24 DIAGNOSIS — N183 Chronic kidney disease, stage 3 (moderate): Secondary | ICD-10-CM | POA: Diagnosis not present

## 2017-07-24 DIAGNOSIS — R278 Other lack of coordination: Secondary | ICD-10-CM | POA: Diagnosis not present

## 2017-07-24 DIAGNOSIS — M6281 Muscle weakness (generalized): Secondary | ICD-10-CM | POA: Diagnosis not present

## 2017-07-24 DIAGNOSIS — C61 Malignant neoplasm of prostate: Secondary | ICD-10-CM | POA: Diagnosis not present

## 2017-07-25 DIAGNOSIS — R262 Difficulty in walking, not elsewhere classified: Secondary | ICD-10-CM | POA: Diagnosis not present

## 2017-07-25 DIAGNOSIS — R1311 Dysphagia, oral phase: Secondary | ICD-10-CM | POA: Diagnosis not present

## 2017-07-25 DIAGNOSIS — L8990 Pressure ulcer of unspecified site, unspecified stage: Secondary | ICD-10-CM | POA: Diagnosis not present

## 2017-07-25 DIAGNOSIS — R278 Other lack of coordination: Secondary | ICD-10-CM | POA: Diagnosis not present

## 2017-07-25 DIAGNOSIS — G9340 Encephalopathy, unspecified: Secondary | ICD-10-CM | POA: Diagnosis not present

## 2017-07-25 DIAGNOSIS — M6281 Muscle weakness (generalized): Secondary | ICD-10-CM | POA: Diagnosis not present

## 2017-07-25 DIAGNOSIS — N183 Chronic kidney disease, stage 3 (moderate): Secondary | ICD-10-CM | POA: Diagnosis not present

## 2017-07-25 DIAGNOSIS — N390 Urinary tract infection, site not specified: Secondary | ICD-10-CM | POA: Diagnosis not present

## 2017-07-25 DIAGNOSIS — C61 Malignant neoplasm of prostate: Secondary | ICD-10-CM | POA: Diagnosis not present

## 2017-08-09 DIAGNOSIS — N183 Chronic kidney disease, stage 3 (moderate): Secondary | ICD-10-CM | POA: Diagnosis not present

## 2017-08-09 DIAGNOSIS — C61 Malignant neoplasm of prostate: Secondary | ICD-10-CM | POA: Diagnosis not present

## 2017-08-09 DIAGNOSIS — M6281 Muscle weakness (generalized): Secondary | ICD-10-CM | POA: Diagnosis not present

## 2017-08-09 DIAGNOSIS — G9340 Encephalopathy, unspecified: Secondary | ICD-10-CM | POA: Diagnosis not present

## 2017-08-09 DIAGNOSIS — E039 Hypothyroidism, unspecified: Secondary | ICD-10-CM | POA: Diagnosis not present

## 2017-08-09 DIAGNOSIS — N390 Urinary tract infection, site not specified: Secondary | ICD-10-CM | POA: Diagnosis not present

## 2017-08-09 DIAGNOSIS — R262 Difficulty in walking, not elsewhere classified: Secondary | ICD-10-CM | POA: Diagnosis not present

## 2017-08-09 DIAGNOSIS — R278 Other lack of coordination: Secondary | ICD-10-CM | POA: Diagnosis not present

## 2017-08-09 DIAGNOSIS — L8990 Pressure ulcer of unspecified site, unspecified stage: Secondary | ICD-10-CM | POA: Diagnosis not present

## 2017-08-09 DIAGNOSIS — R1311 Dysphagia, oral phase: Secondary | ICD-10-CM | POA: Diagnosis not present

## 2017-08-09 DIAGNOSIS — D638 Anemia in other chronic diseases classified elsewhere: Secondary | ICD-10-CM | POA: Diagnosis not present

## 2017-08-10 DIAGNOSIS — R262 Difficulty in walking, not elsewhere classified: Secondary | ICD-10-CM | POA: Diagnosis not present

## 2017-08-10 DIAGNOSIS — L8990 Pressure ulcer of unspecified site, unspecified stage: Secondary | ICD-10-CM | POA: Diagnosis not present

## 2017-08-10 DIAGNOSIS — M6281 Muscle weakness (generalized): Secondary | ICD-10-CM | POA: Diagnosis not present

## 2017-08-10 DIAGNOSIS — N183 Chronic kidney disease, stage 3 (moderate): Secondary | ICD-10-CM | POA: Diagnosis not present

## 2017-08-10 DIAGNOSIS — N390 Urinary tract infection, site not specified: Secondary | ICD-10-CM | POA: Diagnosis not present

## 2017-08-10 DIAGNOSIS — C61 Malignant neoplasm of prostate: Secondary | ICD-10-CM | POA: Diagnosis not present

## 2017-08-10 DIAGNOSIS — R1311 Dysphagia, oral phase: Secondary | ICD-10-CM | POA: Diagnosis not present

## 2017-08-10 DIAGNOSIS — R278 Other lack of coordination: Secondary | ICD-10-CM | POA: Diagnosis not present

## 2017-08-10 DIAGNOSIS — G9340 Encephalopathy, unspecified: Secondary | ICD-10-CM | POA: Diagnosis not present

## 2017-08-11 DIAGNOSIS — N390 Urinary tract infection, site not specified: Secondary | ICD-10-CM | POA: Diagnosis not present

## 2017-08-11 DIAGNOSIS — M6281 Muscle weakness (generalized): Secondary | ICD-10-CM | POA: Diagnosis not present

## 2017-08-11 DIAGNOSIS — R1311 Dysphagia, oral phase: Secondary | ICD-10-CM | POA: Diagnosis not present

## 2017-08-11 DIAGNOSIS — R278 Other lack of coordination: Secondary | ICD-10-CM | POA: Diagnosis not present

## 2017-08-11 DIAGNOSIS — C61 Malignant neoplasm of prostate: Secondary | ICD-10-CM | POA: Diagnosis not present

## 2017-08-11 DIAGNOSIS — G9340 Encephalopathy, unspecified: Secondary | ICD-10-CM | POA: Diagnosis not present

## 2017-08-11 DIAGNOSIS — N183 Chronic kidney disease, stage 3 (moderate): Secondary | ICD-10-CM | POA: Diagnosis not present

## 2017-08-11 DIAGNOSIS — R262 Difficulty in walking, not elsewhere classified: Secondary | ICD-10-CM | POA: Diagnosis not present

## 2017-08-11 DIAGNOSIS — L8990 Pressure ulcer of unspecified site, unspecified stage: Secondary | ICD-10-CM | POA: Diagnosis not present

## 2017-08-14 DIAGNOSIS — R278 Other lack of coordination: Secondary | ICD-10-CM | POA: Diagnosis not present

## 2017-08-14 DIAGNOSIS — C61 Malignant neoplasm of prostate: Secondary | ICD-10-CM | POA: Diagnosis not present

## 2017-08-14 DIAGNOSIS — N390 Urinary tract infection, site not specified: Secondary | ICD-10-CM | POA: Diagnosis not present

## 2017-08-14 DIAGNOSIS — G9340 Encephalopathy, unspecified: Secondary | ICD-10-CM | POA: Diagnosis not present

## 2017-08-14 DIAGNOSIS — R262 Difficulty in walking, not elsewhere classified: Secondary | ICD-10-CM | POA: Diagnosis not present

## 2017-08-14 DIAGNOSIS — N183 Chronic kidney disease, stage 3 (moderate): Secondary | ICD-10-CM | POA: Diagnosis not present

## 2017-08-14 DIAGNOSIS — M6281 Muscle weakness (generalized): Secondary | ICD-10-CM | POA: Diagnosis not present

## 2017-08-14 DIAGNOSIS — R1311 Dysphagia, oral phase: Secondary | ICD-10-CM | POA: Diagnosis not present

## 2017-08-14 DIAGNOSIS — L8990 Pressure ulcer of unspecified site, unspecified stage: Secondary | ICD-10-CM | POA: Diagnosis not present

## 2017-08-15 DIAGNOSIS — N390 Urinary tract infection, site not specified: Secondary | ICD-10-CM | POA: Diagnosis not present

## 2017-08-15 DIAGNOSIS — G9340 Encephalopathy, unspecified: Secondary | ICD-10-CM | POA: Diagnosis not present

## 2017-08-15 DIAGNOSIS — R1311 Dysphagia, oral phase: Secondary | ICD-10-CM | POA: Diagnosis not present

## 2017-08-15 DIAGNOSIS — C61 Malignant neoplasm of prostate: Secondary | ICD-10-CM | POA: Diagnosis not present

## 2017-08-15 DIAGNOSIS — R278 Other lack of coordination: Secondary | ICD-10-CM | POA: Diagnosis not present

## 2017-08-15 DIAGNOSIS — L8990 Pressure ulcer of unspecified site, unspecified stage: Secondary | ICD-10-CM | POA: Diagnosis not present

## 2017-08-15 DIAGNOSIS — N183 Chronic kidney disease, stage 3 (moderate): Secondary | ICD-10-CM | POA: Diagnosis not present

## 2017-08-15 DIAGNOSIS — R262 Difficulty in walking, not elsewhere classified: Secondary | ICD-10-CM | POA: Diagnosis not present

## 2017-08-15 DIAGNOSIS — M6281 Muscle weakness (generalized): Secondary | ICD-10-CM | POA: Diagnosis not present

## 2017-09-29 ENCOUNTER — Inpatient Hospital Stay (HOSPITAL_COMMUNITY): Payer: Medicare HMO

## 2017-09-29 ENCOUNTER — Emergency Department (HOSPITAL_COMMUNITY): Payer: Medicare HMO

## 2017-09-29 ENCOUNTER — Encounter (HOSPITAL_COMMUNITY): Payer: Self-pay | Admitting: Emergency Medicine

## 2017-09-29 ENCOUNTER — Inpatient Hospital Stay (HOSPITAL_COMMUNITY)
Admission: EM | Admit: 2017-09-29 | Discharge: 2017-10-08 | DRG: 698 | Disposition: A | Discharge status: Skilled Nursing Facility | Payer: Medicare HMO | Attending: Internal Medicine | Admitting: Internal Medicine

## 2017-09-29 DIAGNOSIS — R6520 Severe sepsis without septic shock: Secondary | ICD-10-CM | POA: Diagnosis not present

## 2017-09-29 DIAGNOSIS — E039 Hypothyroidism, unspecified: Secondary | ICD-10-CM | POA: Diagnosis present

## 2017-09-29 DIAGNOSIS — K9289 Other specified diseases of the digestive system: Secondary | ICD-10-CM | POA: Diagnosis not present

## 2017-09-29 DIAGNOSIS — Z515 Encounter for palliative care: Secondary | ICD-10-CM | POA: Diagnosis not present

## 2017-09-29 DIAGNOSIS — R338 Other retention of urine: Secondary | ICD-10-CM | POA: Diagnosis present

## 2017-09-29 DIAGNOSIS — J189 Pneumonia, unspecified organism: Secondary | ICD-10-CM

## 2017-09-29 DIAGNOSIS — K5730 Diverticulosis of large intestine without perforation or abscess without bleeding: Secondary | ICD-10-CM | POA: Diagnosis not present

## 2017-09-29 DIAGNOSIS — R278 Other lack of coordination: Secondary | ICD-10-CM | POA: Diagnosis not present

## 2017-09-29 DIAGNOSIS — K567 Ileus, unspecified: Secondary | ICD-10-CM | POA: Diagnosis not present

## 2017-09-29 DIAGNOSIS — D649 Anemia, unspecified: Secondary | ICD-10-CM | POA: Diagnosis not present

## 2017-09-29 DIAGNOSIS — T83518A Infection and inflammatory reaction due to other urinary catheter, initial encounter: Principal | ICD-10-CM | POA: Diagnosis present

## 2017-09-29 DIAGNOSIS — K56609 Unspecified intestinal obstruction, unspecified as to partial versus complete obstruction: Secondary | ICD-10-CM | POA: Diagnosis not present

## 2017-09-29 DIAGNOSIS — J156 Pneumonia due to other aerobic Gram-negative bacteria: Secondary | ICD-10-CM | POA: Diagnosis not present

## 2017-09-29 DIAGNOSIS — Z7189 Other specified counseling: Secondary | ICD-10-CM | POA: Diagnosis not present

## 2017-09-29 DIAGNOSIS — Z87440 Personal history of urinary (tract) infections: Secondary | ICD-10-CM

## 2017-09-29 DIAGNOSIS — R1312 Dysphagia, oropharyngeal phase: Secondary | ICD-10-CM | POA: Diagnosis not present

## 2017-09-29 DIAGNOSIS — R1110 Vomiting, unspecified: Secondary | ICD-10-CM

## 2017-09-29 DIAGNOSIS — E872 Acidosis: Secondary | ICD-10-CM | POA: Diagnosis present

## 2017-09-29 DIAGNOSIS — M311 Thrombotic microangiopathy: Secondary | ICD-10-CM | POA: Diagnosis present

## 2017-09-29 DIAGNOSIS — M6281 Muscle weakness (generalized): Secondary | ICD-10-CM | POA: Diagnosis not present

## 2017-09-29 DIAGNOSIS — R490 Dysphonia: Secondary | ICD-10-CM | POA: Diagnosis not present

## 2017-09-29 DIAGNOSIS — A0472 Enterocolitis due to Clostridium difficile, not specified as recurrent: Secondary | ICD-10-CM | POA: Diagnosis present

## 2017-09-29 DIAGNOSIS — D62 Acute posthemorrhagic anemia: Secondary | ICD-10-CM | POA: Diagnosis present

## 2017-09-29 DIAGNOSIS — E870 Hyperosmolality and hypernatremia: Secondary | ICD-10-CM | POA: Diagnosis not present

## 2017-09-29 DIAGNOSIS — F0390 Unspecified dementia without behavioral disturbance: Secondary | ICD-10-CM | POA: Diagnosis present

## 2017-09-29 DIAGNOSIS — J9691 Respiratory failure, unspecified with hypoxia: Secondary | ICD-10-CM | POA: Diagnosis present

## 2017-09-29 DIAGNOSIS — K920 Hematemesis: Secondary | ICD-10-CM

## 2017-09-29 DIAGNOSIS — R402441 Other coma, without documented Glasgow coma scale score, or with partial score reported, in the field [EMT or ambulance]: Secondary | ICD-10-CM | POA: Diagnosis not present

## 2017-09-29 DIAGNOSIS — Z09 Encounter for follow-up examination after completed treatment for conditions other than malignant neoplasm: Secondary | ICD-10-CM

## 2017-09-29 DIAGNOSIS — Z823 Family history of stroke: Secondary | ICD-10-CM | POA: Diagnosis not present

## 2017-09-29 DIAGNOSIS — G9340 Encephalopathy, unspecified: Secondary | ICD-10-CM | POA: Diagnosis present

## 2017-09-29 DIAGNOSIS — H9190 Unspecified hearing loss, unspecified ear: Secondary | ICD-10-CM | POA: Diagnosis present

## 2017-09-29 DIAGNOSIS — R1310 Dysphagia, unspecified: Secondary | ICD-10-CM | POA: Diagnosis not present

## 2017-09-29 DIAGNOSIS — B964 Proteus (mirabilis) (morganii) as the cause of diseases classified elsewhere: Secondary | ICD-10-CM | POA: Diagnosis not present

## 2017-09-29 DIAGNOSIS — A4159 Other Gram-negative sepsis: Secondary | ICD-10-CM | POA: Diagnosis present

## 2017-09-29 DIAGNOSIS — R7881 Bacteremia: Secondary | ICD-10-CM | POA: Diagnosis not present

## 2017-09-29 DIAGNOSIS — R0902 Hypoxemia: Secondary | ICD-10-CM | POA: Diagnosis not present

## 2017-09-29 DIAGNOSIS — R011 Cardiac murmur, unspecified: Secondary | ICD-10-CM | POA: Diagnosis not present

## 2017-09-29 DIAGNOSIS — N183 Chronic kidney disease, stage 3 unspecified: Secondary | ICD-10-CM | POA: Diagnosis present

## 2017-09-29 DIAGNOSIS — J181 Lobar pneumonia, unspecified organism: Secondary | ICD-10-CM

## 2017-09-29 DIAGNOSIS — N390 Urinary tract infection, site not specified: Secondary | ICD-10-CM

## 2017-09-29 DIAGNOSIS — Z85828 Personal history of other malignant neoplasm of skin: Secondary | ICD-10-CM | POA: Diagnosis not present

## 2017-09-29 DIAGNOSIS — I491 Atrial premature depolarization: Secondary | ICD-10-CM | POA: Diagnosis not present

## 2017-09-29 DIAGNOSIS — K5900 Constipation, unspecified: Secondary | ICD-10-CM | POA: Diagnosis not present

## 2017-09-29 DIAGNOSIS — N139 Obstructive and reflux uropathy, unspecified: Secondary | ICD-10-CM | POA: Diagnosis present

## 2017-09-29 DIAGNOSIS — K5909 Other constipation: Secondary | ICD-10-CM | POA: Diagnosis not present

## 2017-09-29 DIAGNOSIS — R000 Tachycardia, unspecified: Secondary | ICD-10-CM | POA: Diagnosis present

## 2017-09-29 DIAGNOSIS — Z66 Do not resuscitate: Secondary | ICD-10-CM | POA: Diagnosis present

## 2017-09-29 DIAGNOSIS — D500 Iron deficiency anemia secondary to blood loss (chronic): Secondary | ICD-10-CM | POA: Diagnosis not present

## 2017-09-29 DIAGNOSIS — E876 Hypokalemia: Secondary | ICD-10-CM | POA: Diagnosis not present

## 2017-09-29 DIAGNOSIS — R319 Hematuria, unspecified: Secondary | ICD-10-CM | POA: Diagnosis present

## 2017-09-29 DIAGNOSIS — K56600 Partial intestinal obstruction, unspecified as to cause: Secondary | ICD-10-CM | POA: Diagnosis not present

## 2017-09-29 DIAGNOSIS — R0689 Other abnormalities of breathing: Secondary | ICD-10-CM | POA: Diagnosis not present

## 2017-09-29 DIAGNOSIS — J690 Pneumonitis due to inhalation of food and vomit: Secondary | ICD-10-CM | POA: Diagnosis not present

## 2017-09-29 DIAGNOSIS — D638 Anemia in other chronic diseases classified elsewhere: Secondary | ICD-10-CM | POA: Diagnosis present

## 2017-09-29 DIAGNOSIS — N401 Enlarged prostate with lower urinary tract symptoms: Secondary | ICD-10-CM | POA: Diagnosis not present

## 2017-09-29 DIAGNOSIS — R935 Abnormal findings on diagnostic imaging of other abdominal regions, including retroperitoneum: Secondary | ICD-10-CM

## 2017-09-29 DIAGNOSIS — K922 Gastrointestinal hemorrhage, unspecified: Secondary | ICD-10-CM | POA: Diagnosis not present

## 2017-09-29 DIAGNOSIS — Z743 Need for continuous supervision: Secondary | ICD-10-CM | POA: Diagnosis not present

## 2017-09-29 DIAGNOSIS — R1111 Vomiting without nausea: Secondary | ICD-10-CM | POA: Diagnosis not present

## 2017-09-29 DIAGNOSIS — Z20818 Contact with and (suspected) exposure to other bacterial communicable diseases: Secondary | ICD-10-CM | POA: Diagnosis not present

## 2017-09-29 DIAGNOSIS — Z9889 Other specified postprocedural states: Secondary | ICD-10-CM | POA: Diagnosis not present

## 2017-09-29 DIAGNOSIS — N179 Acute kidney failure, unspecified: Secondary | ICD-10-CM | POA: Diagnosis present

## 2017-09-29 DIAGNOSIS — R062 Wheezing: Secondary | ICD-10-CM | POA: Diagnosis not present

## 2017-09-29 DIAGNOSIS — Z8546 Personal history of malignant neoplasm of prostate: Secondary | ICD-10-CM

## 2017-09-29 DIAGNOSIS — Z79890 Hormone replacement therapy (postmenopausal): Secondary | ICD-10-CM

## 2017-09-29 DIAGNOSIS — Z960 Presence of urogenital implants: Secondary | ICD-10-CM | POA: Diagnosis not present

## 2017-09-29 DIAGNOSIS — R0602 Shortness of breath: Secondary | ICD-10-CM | POA: Diagnosis not present

## 2017-09-29 DIAGNOSIS — J9601 Acute respiratory failure with hypoxia: Secondary | ICD-10-CM | POA: Diagnosis not present

## 2017-09-29 DIAGNOSIS — A419 Sepsis, unspecified organism: Secondary | ICD-10-CM | POA: Diagnosis present

## 2017-09-29 DIAGNOSIS — I4510 Unspecified right bundle-branch block: Secondary | ICD-10-CM | POA: Diagnosis not present

## 2017-09-29 DIAGNOSIS — I959 Hypotension, unspecified: Secondary | ICD-10-CM | POA: Diagnosis present

## 2017-09-29 DIAGNOSIS — R195 Other fecal abnormalities: Secondary | ICD-10-CM | POA: Diagnosis not present

## 2017-09-29 DIAGNOSIS — C183 Malignant neoplasm of hepatic flexure: Secondary | ICD-10-CM | POA: Diagnosis not present

## 2017-09-29 DIAGNOSIS — R042 Hemoptysis: Secondary | ICD-10-CM | POA: Diagnosis present

## 2017-09-29 DIAGNOSIS — R339 Retention of urine, unspecified: Secondary | ICD-10-CM | POA: Diagnosis not present

## 2017-09-29 DIAGNOSIS — R918 Other nonspecific abnormal finding of lung field: Secondary | ICD-10-CM | POA: Diagnosis not present

## 2017-09-29 DIAGNOSIS — Z79899 Other long term (current) drug therapy: Secondary | ICD-10-CM

## 2017-09-29 DIAGNOSIS — N138 Other obstructive and reflux uropathy: Secondary | ICD-10-CM | POA: Diagnosis not present

## 2017-09-29 DIAGNOSIS — R2689 Other abnormalities of gait and mobility: Secondary | ICD-10-CM | POA: Diagnosis not present

## 2017-09-29 DIAGNOSIS — C61 Malignant neoplasm of prostate: Secondary | ICD-10-CM | POA: Diagnosis not present

## 2017-09-29 DIAGNOSIS — R279 Unspecified lack of coordination: Secondary | ICD-10-CM | POA: Diagnosis not present

## 2017-09-29 HISTORY — DX: Major depressive disorder, single episode, unspecified: F32.9

## 2017-09-29 HISTORY — DX: Other chronic pain: G89.29

## 2017-09-29 HISTORY — DX: Presence of urogenital implants: Z96.0

## 2017-09-29 HISTORY — DX: Pneumonia, unspecified organism: J18.9

## 2017-09-29 HISTORY — DX: Personal history of other medical treatment: Z92.89

## 2017-09-29 HISTORY — DX: Presence of other specified devices: Z97.8

## 2017-09-29 HISTORY — DX: Hypothyroidism, unspecified: E03.9

## 2017-09-29 HISTORY — DX: Depression, unspecified: F32.A

## 2017-09-29 HISTORY — DX: Unspecified osteoarthritis, unspecified site: M19.90

## 2017-09-29 HISTORY — DX: Urinary tract infection, site not specified: N39.0

## 2017-09-29 HISTORY — DX: Malignant neoplasm of prostate: C61

## 2017-09-29 HISTORY — DX: Dorsalgia, unspecified: M54.9

## 2017-09-29 HISTORY — DX: Anemia, unspecified: D64.9

## 2017-09-29 LAB — BLOOD CULTURE ID PANEL (REFLEXED)
Acinetobacter baumannii: NOT DETECTED
Candida albicans: NOT DETECTED
Candida glabrata: NOT DETECTED
Candida krusei: NOT DETECTED
Candida parapsilosis: NOT DETECTED
Candida tropicalis: NOT DETECTED
Carbapenem resistance: NOT DETECTED
ENTEROBACTER CLOACAE COMPLEX: NOT DETECTED
ENTEROCOCCUS SPECIES: NOT DETECTED
Enterobacteriaceae species: DETECTED — AB
Escherichia coli: NOT DETECTED
HAEMOPHILUS INFLUENZAE: NOT DETECTED
Klebsiella oxytoca: NOT DETECTED
Klebsiella pneumoniae: NOT DETECTED
Listeria monocytogenes: NOT DETECTED
NEISSERIA MENINGITIDIS: NOT DETECTED
Proteus species: DETECTED — AB
Pseudomonas aeruginosa: NOT DETECTED
SERRATIA MARCESCENS: NOT DETECTED
STAPHYLOCOCCUS SPECIES: NOT DETECTED
STREPTOCOCCUS AGALACTIAE: NOT DETECTED
STREPTOCOCCUS SPECIES: NOT DETECTED
Staphylococcus aureus (BCID): NOT DETECTED
Streptococcus pneumoniae: NOT DETECTED
Streptococcus pyogenes: NOT DETECTED

## 2017-09-29 LAB — COMPREHENSIVE METABOLIC PANEL
ALBUMIN: 2.8 g/dL — AB (ref 3.5–5.0)
ALK PHOS: 60 U/L (ref 38–126)
ALT: 15 U/L — ABNORMAL LOW (ref 17–63)
AST: 28 U/L (ref 15–41)
Anion gap: 9 (ref 5–15)
BILIRUBIN TOTAL: 0.7 mg/dL (ref 0.3–1.2)
BUN: 34 mg/dL — ABNORMAL HIGH (ref 6–20)
CO2: 22 mmol/L (ref 22–32)
CREATININE: 1.86 mg/dL — AB (ref 0.61–1.24)
Calcium: 8 mg/dL — ABNORMAL LOW (ref 8.9–10.3)
Chloride: 107 mmol/L (ref 101–111)
GFR calc Af Amer: 33 mL/min — ABNORMAL LOW (ref >60)
GFR calc non Af Amer: 29 mL/min — ABNORMAL LOW (ref >60)
GLUCOSE: 83 mg/dL (ref 65–99)
Potassium: 4.2 mmol/L (ref 3.5–5.1)
Sodium: 138 mmol/L (ref 135–145)
TOTAL PROTEIN: 5.6 g/dL — AB (ref 6.5–8.1)

## 2017-09-29 LAB — I-STAT CG4 LACTIC ACID, ED
LACTIC ACID, VENOUS: 2.5 mmol/L — AB (ref 0.5–1.9)
Lactic Acid, Venous: 3.3 mmol/L (ref 0.5–1.9)

## 2017-09-29 LAB — CBC WITH DIFFERENTIAL/PLATELET
Abs Immature Granulocytes: 0.1 10*3/uL (ref 0.0–0.1)
Basophils Absolute: 0 10*3/uL (ref 0.0–0.1)
Basophils Relative: 0 %
EOS ABS: 0 10*3/uL (ref 0.0–0.7)
EOS PCT: 0 %
HCT: 32 % — ABNORMAL LOW (ref 39.0–52.0)
Hemoglobin: 10 g/dL — ABNORMAL LOW (ref 13.0–17.0)
IMMATURE GRANULOCYTES: 0 %
LYMPHS ABS: 0.1 10*3/uL — AB (ref 0.7–4.0)
Lymphocytes Relative: 1 %
MCH: 28.8 pg (ref 26.0–34.0)
MCHC: 31.3 g/dL (ref 30.0–36.0)
MCV: 92.2 fL (ref 78.0–100.0)
MONOS PCT: 1 %
Monocytes Absolute: 0.1 10*3/uL (ref 0.1–1.0)
NEUTROS PCT: 98 %
Neutro Abs: 13.6 10*3/uL — ABNORMAL HIGH (ref 1.7–7.7)
Platelets: 218 10*3/uL (ref 150–400)
RBC: 3.47 MIL/uL — ABNORMAL LOW (ref 4.22–5.81)
RDW: 13.7 % (ref 11.5–15.5)
WBC: 13.9 10*3/uL — ABNORMAL HIGH (ref 4.0–10.5)

## 2017-09-29 LAB — URINALYSIS, ROUTINE W REFLEX MICROSCOPIC
Bilirubin Urine: NEGATIVE
Glucose, UA: NEGATIVE mg/dL
Ketones, ur: NEGATIVE mg/dL
Nitrite: NEGATIVE
Protein, ur: >=300 mg/dL — AB
RBC / HPF: >50 RBC/hpf — ABNORMAL HIGH (ref 0–5)
SPECIFIC GRAVITY, URINE: 1.015 (ref 1.005–1.030)
WBC, UA: >50 WBC/hpf — ABNORMAL HIGH (ref 0–5)
pH: 8 (ref 5.0–8.0)

## 2017-09-29 LAB — PROTIME-INR
INR: 1.07
Prothrombin Time: 13.8 seconds (ref 11.4–15.2)

## 2017-09-29 LAB — I-STAT TROPONIN, ED: Troponin i, poc: 0.03 ng/mL (ref 0.00–0.08)

## 2017-09-29 LAB — MRSA PCR SCREENING: MRSA BY PCR: POSITIVE — AB

## 2017-09-29 LAB — D-DIMER, QUANTITATIVE: D-Dimer, Quant: 18.79 ug/mL-FEU — ABNORMAL HIGH (ref 0.00–0.50)

## 2017-09-29 LAB — LACTIC ACID, PLASMA: LACTIC ACID, VENOUS: 1.7 mmol/L (ref 0.5–1.9)

## 2017-09-29 MED ORDER — SODIUM CHLORIDE 0.9 % IV SOLN
INTRAVENOUS | Status: DC
  Administered 2017-09-29 – 2017-10-02 (×8): via INTRAVENOUS

## 2017-09-29 MED ORDER — POLYETHYLENE GLYCOL 3350 17 G PO PACK
17.0000 g | PACK | Freq: Every day | ORAL | Status: DC
  Administered 2017-09-29: 17 g via ORAL
  Filled 2017-09-29: qty 1

## 2017-09-29 MED ORDER — SODIUM CHLORIDE 0.9 % IV BOLUS (SEPSIS)
1000.0000 mL | Freq: Once | INTRAVENOUS | Status: AC
  Administered 2017-09-29: 1000 mL via INTRAVENOUS

## 2017-09-29 MED ORDER — PIPERACILLIN-TAZOBACTAM IN DEX 2-0.25 GM/50ML IV SOLN
2.2500 g | Freq: Four times a day (QID) | INTRAVENOUS | Status: DC
  Administered 2017-09-29: 2.25 g via INTRAVENOUS
  Filled 2017-09-29 (×3): qty 50

## 2017-09-29 MED ORDER — MUPIROCIN 2 % EX OINT
1.0000 "application " | TOPICAL_OINTMENT | Freq: Two times a day (BID) | CUTANEOUS | Status: AC
  Administered 2017-09-29 – 2017-10-04 (×10): 1 via NASAL
  Filled 2017-09-29 (×3): qty 22

## 2017-09-29 MED ORDER — VANCOMYCIN HCL IN DEXTROSE 1-5 GM/200ML-% IV SOLN
1000.0000 mg | Freq: Once | INTRAVENOUS | Status: AC
  Administered 2017-09-29: 1000 mg via INTRAVENOUS
  Filled 2017-09-29: qty 200

## 2017-09-29 MED ORDER — SODIUM CHLORIDE 0.9 % IV SOLN
2.0000 g | INTRAVENOUS | Status: DC
  Administered 2017-09-29: 2 g via INTRAVENOUS
  Filled 2017-09-29: qty 20

## 2017-09-29 MED ORDER — VANCOMYCIN HCL IN DEXTROSE 1-5 GM/200ML-% IV SOLN
1000.0000 mg | INTRAVENOUS | Status: DC
Start: 2017-10-01 — End: 2017-09-29

## 2017-09-29 MED ORDER — PIPERACILLIN-TAZOBACTAM 3.375 G IVPB 30 MIN
3.3750 g | Freq: Once | INTRAVENOUS | Status: AC
  Administered 2017-09-29: 3.375 g via INTRAVENOUS
  Filled 2017-09-29: qty 50

## 2017-09-29 MED ORDER — ENOXAPARIN SODIUM 40 MG/0.4ML ~~LOC~~ SOLN: 40.0000 mg | SUBCUTANEOUS | Status: DC

## 2017-09-29 MED ORDER — LEVOTHYROXINE SODIUM 25 MCG PO TABS
25.0000 ug | ORAL_TABLET | Freq: Every day | ORAL | Status: DC
Start: 2017-09-30 — End: 2017-10-08
  Administered 2017-09-30 – 2017-10-08 (×9): 25 ug via ORAL
  Filled 2017-09-29 (×9): qty 1

## 2017-09-29 MED ORDER — CHLORHEXIDINE GLUCONATE CLOTH 2 % EX PADS
6.0000 | MEDICATED_PAD | Freq: Every day | CUTANEOUS | Status: AC
  Administered 2017-09-30 – 2017-10-04 (×4): 6 via TOPICAL

## 2017-09-29 MED ORDER — MENTHOL (TOPICAL ANALGESIC) 4 % EX GEL: Freq: Every day | CUTANEOUS | Status: DC | PRN

## 2017-09-29 MED ORDER — ONDANSETRON HCL 4 MG/2ML IJ SOLN
4.0000 mg | Freq: Once | INTRAMUSCULAR | Status: AC
  Administered 2017-09-29: 4 mg via INTRAVENOUS
  Filled 2017-09-29: qty 2

## 2017-09-29 MED ORDER — SODIUM CHLORIDE 0.9 % IV BOLUS
500.0000 mL | Freq: Once | INTRAVENOUS | Status: AC
  Administered 2017-09-29: 500 mL via INTRAVENOUS

## 2017-09-29 MED ORDER — BISACODYL 5 MG PO TBEC
5.0000 mg | DELAYED_RELEASE_TABLET | Freq: Once | ORAL | Status: AC
  Administered 2017-09-29: 5 mg via ORAL
  Filled 2017-09-29: qty 1

## 2017-09-29 MED ORDER — ONDANSETRON HCL 4 MG/2ML IJ SOLN
4.0000 mg | Freq: Four times a day (QID) | INTRAMUSCULAR | Status: DC | PRN
Start: 2017-09-29 — End: 2017-10-08
  Administered 2017-10-01: 4 mg via INTRAVENOUS
  Filled 2017-09-29: qty 2

## 2017-09-29 MED ORDER — ENOXAPARIN SODIUM 30 MG/0.3ML ~~LOC~~ SOLN
30.0000 mg | Freq: Every day | SUBCUTANEOUS | Status: DC
  Administered 2017-09-29 – 2017-10-02 (×4): 30 mg via SUBCUTANEOUS
  Filled 2017-09-29 (×4): qty 0.3

## 2017-09-29 MED ORDER — MUSCLE RUB 10-15 % EX CREA
TOPICAL_CREAM | Freq: Every day | CUTANEOUS | Status: DC | PRN
  Administered 2017-10-01: 23:00:00 via TOPICAL
  Filled 2017-09-29 (×2): qty 85

## 2017-09-29 NOTE — ED Triage Notes (Signed)
From [facility] per [facility] called out for shortness of breath with episdoes of coughing up blood. EMS reports no coughing episodes with them, 104 Temp, RR at 32 sating 96% on RA.

## 2017-09-29 NOTE — ED Notes (Signed)
Report taken from night shift RN - care assumed at this time; resting quietly on stretcher with no complaints; ccm showing ST rate 106 without ectopy; IV fluids infusing without difficulty - awaiting into bed assignment - pt aware of same- daughter Chrys Racer) is stepping out for a while - will return in a few hours - contact number [phone]

## 2017-09-29 NOTE — ED Notes (Signed)
Attempted to call pt's daughter to inform of inpt room assignment - no answer

## 2017-09-29 NOTE — ED Notes (Signed)
Daughter's Phone #: [phone]

## 2017-09-29 NOTE — ED Notes (Signed)
Report called to Mendel Ryder, RN on 6N - ready to accept pt

## 2017-09-29 NOTE — H&P (Signed)
Date: 09/29/2017               Patient Name:  Anthony Nicholson MRN: [id number]  DOB: [birth year] Age / Sex: 84 y.o., male   PCP: [facility] [place] Service: Internal Medicine Teaching Service         Attending Physician: Dr. Annia Belt, MD    First Contact: Dr. Frederico Hamman Pager: [phone]  Second Contact: Dr. Danford Bad Pager: [phone]       After Hours (After 5p/  First Contact Pager: [phone]  weekends / holidays): Second Contact Pager: [phone]   Chief Complaint: Penile and suprapubic pain  History of Present Illness: Anthony Nicholson is a 84 year old man with a history of prostate cancer and urinary retention with indwelling Foley catheter, chronic kidney disease stage III, hypothyroidism, and dysphagia who presented to the ED from [facility] where he was reported to have hemoptysis and dyspnea with decreased oxygen saturation to 80 to 90%.  During the previous day at his facility he developed the urinary complaints and was found to have an obstructed/nonfunctioning Foley catheter which had to be exchanged last night to resume urine output.  His family who was present earlier this morning states his current confusion is different from baseline and they believesimilar to previous mental status changes when suffering UTIs in the past.  Due to his confusion I am unable to get a accurate course of events at his facility from the patient during my interview.  At my exam his primary complaint is of ongoing suprapubic and penile pain. He also complains of constipation with no bowel movements in the past few days despite straining with attempts.  He was noted to be febrile to 102.8 on arrival to the ED and tachycardic but not hypoxic.  Lactic acid was elevated at 3.3 with a leukocytosis to 13.9, AKI with a creatinine of 1.86 from prior baseline near 1.0, blood pressure low but stable in 31D systolic.  Chest x-ray was obtained concerning for new right lower lobe airspace infiltrate.   Urinalysis was consistent with infection, in a patient with chronic indwelling Foley catheter.  He was started on empiric antibiotics with vancomycin and Zosyn and fluid resuscitation was started.  Anthony Nicholson is a [employer] War II veteran and followed for his medical care primarily through the [facility]. He  Is residing in a private ALF and has routine foley catheter care with them. He sees a Animator for this.  He has a MOST form specifying his DNR status, with agreement for hospitalization, antibiotics, and IV fluids if appropriate.  Meds:  Current Meds  Medication Sig  . acetaminophen (TYLENOL) 325 MG tablet Take 650 mg by mouth every 6 (six) hours as needed for mild pain.  . carboxymethylcellulose (REFRESH PLUS) 0.5 % SOLN Place 1 drop into both eyes 3 (three) times daily as needed (dry eye).  . cholecalciferol (VITAMIN D) 1000 units tablet Take 2,000 Units by mouth daily.  . folic acid (FOLVITE) 1 MG tablet Take 1 mg by mouth daily.  Marland Kitchen levothyroxine (SYNTHROID, LEVOTHROID) 25 MCG tablet Take 25 mcg by mouth daily before breakfast.  . loperamide (IMODIUM A-D) 2 MG tablet Take 2 mg by mouth as needed for diarrhea or loose stools.  . Menthol, Topical Analgesic, (BIOFREEZE) 4 % GEL Apply topically daily as needed (for pain to upper back and bilateral shoulder).  Marland Kitchen UNABLE TO FIND Take 120 mLs by mouth 2 (  two) times daily. Med Name: MedPass 2.0  . vitamin B-12 (CYANOCOBALAMIN) 500 MCG tablet Take 500 mcg by mouth daily.    Allergies: Allergies as of 09/29/2017  . (No Known Allergies)   Past Medical History:  Diagnosis Date  . Basal cell carcinoma   . HOH (hard of hearing)   . Indwelling urinary catheter present   . Laceration of arm 02/04/2013    Family History:  Family History  Problem Relation Age of Onset  . Glaucoma Mother   . Stroke Father   . Prostate cancer Neg Hx      Social History:  Occupational History  . Occupation: Retired  Tobacco Use  . Smoking status:  Never Smoker  . Smokeless tobacco: Never Used  Substance and Sexual Activity  . Alcohol use: No    Alcohol/week: 0.0 oz  . Drug use: No  . Sexual activity: Not Currently    Review of Systems: A complete ROS was limited by the patient's confusion. Pertinent elements from chart review, patient encounter, and EDP are described in HPI above.  Physical Exam: Blood pressure (!) 102/53, pulse (!) 107, temperature 98.6 F (37 C), temperature source Oral, resp. rate 20, height 5\' 6"  (1.676 m), weight 136 lb (61.7 kg), SpO2 97 %. GENERAL- Frail appearing man in no acute distress HEENT- Dry oral mucosa, bilateral hearing aids in place, no cervical lymphadenopathy CARDIAC- Tachycardic, regular rhythm, no murmur RESP- Reduced air movement in both lung bases from shallow inspirations ABDOMEN- Significant midline suprapubic tennderness to palpation EXTREMITIES- senile purpura on forearms, thin but appropriate strength SKIN- Diffuse actinic keratoses, bruising, no rashes PSYCH- Oriented to self, confused about immediate history, redirectable and pleasant  EKG: personally reviewed my interpretation is a regular sinus rhythm at 118 bpm with a right bundle branch block similar pattern to prior tracing from 06/06/2017.  CXR: personally reviewed my interpretation is a new right lower airspace infiltrate with no significant cardiac enlargement or vascular marking changes from prior 06/06/2017 film.  Assessment & Plan by Problem: Sepsis from either pulmonary or urinary tract source He was sent from the facility for evaluation due to respiratory symptoms however all of his complaints at the time of my exam are of suprapubic and penile pain with the recent catheter obstruction requiring exchange yesterday.  Is likely chronically colonized due to the indwelling catheter making the urinalysis nonspecific but likely has a true UTI based on symptoms.  He also had outside facility cough and hypoxia and with a right  lower airspace infiltrate on chest imaging would be consistent with a pneumonia.  My pulmonary exam was limited by the reduced air movement in both lower lungs. He is septic by the criteria of encephalopathy (based on his described baseline), acute kidney injury, lactic acidosis, plus his fever tachycardia and leukocytosis.  A d-dimer was obtained at 16.8 which is nonspecific in the setting of systemic inflammation.  He does have risk factors for drug-resistant organisms given multiple previous UTIs, chronic colonization, and presentation from a combined ALF/SNF facility. -Continue empiric broad-spectrum antibiotics vancomycin/zosyn -Trend lactic acidosis to resolution -Follow-up urine and blood cultures -Up with assistance only due to frailty, confusion  Indwelling Foley catheter for chronic urinary retention due to prostate adenocarcinoma This was exchanged last night and he is now on broad spectrum coverage. He has polymicrobial cultures from previous visits likely from colonization. E. Coli and proteus were previously isolated in 2016.  CKD stage 3 This is documented as stage III at baseline although previous  creatinine levels approximately 1.0 with a GFR greater than 60.  Currently in exacerbation. -IVF resuscitation -Maintain MAP >65 with fluids as needed  Dysphagia He is on a dysphagia 2 diet as his facility.  There is no specifically described preceding history of coughing, choking, or vomiting though this may be a risk for new or ongoing unwitnessed aspiration. -Dysphagia 2 diet -SLP eval  Diet: Dysphagia 2 VTE ppx: [place] enoxaparin DNR/DNI  Dispo: Admit patient to Inpatient with expected length of stay greater than 2 midnights.  Signed: Collier Salina, MD PGY-III Internal Medicine Resident Pager# [phone] 09/29/2017, 10:34 AM

## 2017-09-29 NOTE — Progress Notes (Signed)
PHARMACY - PHYSICIAN COMMUNICATION CRITICAL VALUE ALERT - BLOOD CULTURE IDENTIFICATION (BCID)  Anthony Nicholson is an 84 y.o. male who presented to [facility] on 09/29/2017 with a chief complaint of penile and suprapubic pain  Assessment:   Blood cx shows Proteus species. Has had a couple episodes of Proteus UTIs in the past. No real resistance noted. CXR shows possible PNA but illness most likely due to Proteus bacteremia. Tmax of 102.8, WBC 13.9.  Name of physician (or Provider) Contacted: IMTS Team  Current antibiotics: Vancomycin and Zosyn  Changes to prescribed antibiotics recommended:  Recommendations accepted by provider  Stop vancomycin and Zosyn Start ceftriaxone 2g IV Q24h  Results for orders placed or performed during the hospital encounter of 09/29/17  Blood Culture ID Panel (Reflexed) (Collected: 09/29/2017  4:26 AM)  Result Value Ref Range   Enterococcus species NOT DETECTED NOT DETECTED   Listeria monocytogenes NOT DETECTED NOT DETECTED   Staphylococcus species NOT DETECTED NOT DETECTED   Staphylococcus aureus NOT DETECTED NOT DETECTED   Streptococcus species NOT DETECTED NOT DETECTED   Streptococcus agalactiae NOT DETECTED NOT DETECTED   Streptococcus pneumoniae NOT DETECTED NOT DETECTED   Streptococcus pyogenes NOT DETECTED NOT DETECTED   Acinetobacter baumannii NOT DETECTED NOT DETECTED   Enterobacteriaceae species DETECTED (A) NOT DETECTED   Enterobacter cloacae complex NOT DETECTED NOT DETECTED   Escherichia coli NOT DETECTED NOT DETECTED   Klebsiella oxytoca NOT DETECTED NOT DETECTED   Klebsiella pneumoniae NOT DETECTED NOT DETECTED   Proteus species DETECTED (A) NOT DETECTED   Serratia marcescens NOT DETECTED NOT DETECTED   Carbapenem resistance NOT DETECTED NOT DETECTED   Haemophilus influenzae NOT DETECTED NOT DETECTED   Neisseria meningitidis NOT DETECTED NOT DETECTED   Pseudomonas aeruginosa NOT DETECTED NOT DETECTED   Candida albicans NOT DETECTED NOT  DETECTED   Candida glabrata NOT DETECTED NOT DETECTED   Candida krusei NOT DETECTED NOT DETECTED   Candida parapsilosis NOT DETECTED NOT DETECTED   Candida tropicalis NOT DETECTED NOT DETECTED    Ashleynicole Mcclees J 09/29/2017  9:02 PM

## 2017-09-29 NOTE — Progress Notes (Signed)
Patient vomited x1 small, brown emesis. C/O abdominal pain. MD notified.

## 2017-09-29 NOTE — Progress Notes (Signed)
MD notified of elevated BP 167/168, P 104. Will continue to monitor.

## 2017-09-29 NOTE — Progress Notes (Signed)
Patient has vomited for the third time. Patient's daughter requested the situation to be escalated to the doctor and wanted a doctor present.   MD paged and made aware of patient complaining of abdominal pain, pain at his penis, emesis episodes, and decrease in O2 sat (80's) on 2L.   Received orders for irrigating current catheter. Irrigated with 66ml. No return. Received order to insert coude catheter. Original foley was removed and penis was bleeding. Coude was inserted and output is bloody.

## 2017-09-29 NOTE — Evaluation (Signed)
Clinical/Bedside Swallow Evaluation Patient Details  Name: Anthony Nicholson MRN: [id number] Date of Birth: [birth year]  Today's Date: 09/29/2017 Time: SLP Start Time (ACUTE ONLY): 1430 SLP Stop Time (ACUTE ONLY): 1456 SLP Time Calculation (min) (ACUTE ONLY): 26 min  Past Medical History:  Past Medical History:  Diagnosis Date  . Basal cell carcinoma   . HOH (hard of hearing)   . Indwelling urinary catheter present   . Laceration of arm 02/04/2013   Past Surgical History:  Past Surgical History:  Procedure Laterality Date  .  sharpnel removal    1943    . CYSTOSCOPY N/A 10/02/2014   Procedure: FLEXIBLE CYSTOSCOPY;  Surgeon: Irine Seal, MD;  Location: [facility];  Service: Urology;  Laterality: N/A;  . I&D EXTREMITY Left 02/04/2013   Procedure: IRRIGATION AND DEBRIDEMENT Left Arm Laceration with Repair as Necessary;  Surgeon: Wylene Simmer, MD;  Location: [facility];  Service: Orthopedics;  Laterality: Left;  . INCISION AND DRAINAGE Left 02/04/2013   left arm laceration    Dr Doran Durand  . PROSTATE BIOPSY N/A 10/02/2014   Procedure: PROSTATE ULTRASOUND AND BIOPSY ;  Surgeon: Irine Seal, MD;  Location: [facility];  Service: Urology;  Laterality: N/A;   HPI:  Anthony Nicholson is a 84 year old man with a history of prostate cancer and urinary retention with indwelling Foley catheter, chronic kidney disease stage III, hypothyroidism, and dysphagia who presented to the ED from [facility] where he was reported to have hemoptysis and dyspnea with decreased oxygen saturation to 80 to 90%.  During the previous day at his facility he developed the urinary complaints and was found to have an obstructed/nonfunctioning Foley catheter which had to be exchanged last night to resume urine output.  His family who was present earlier this morning states his current confusion is different from baseline and they believesimilar to previous mental status changes when suffering UTIs in the past.  Due to his confusion I am unable to get  a accurate course of events at his facility from the patient during my interview.  At my exam his primary complaint is of ongoing suprapubic and penile pain. He also complains of constipation with no bowel movements in the past few days despite straining with attempts.  Most recent CXR is showing patchin RLL opacity with small pleural effusion suspicious for PNA.     Assessment / Plan / Recommendation Clinical Impression  Clinical swallowing evaluation was completed using thin liquids, pureed material and dry solids.  Oral mechanism exam was completed and remarkable for generalized oral weakness.  Nursing and patient daughter reported that the patient had vomitted earlier in the day.  He does not complain of nausea and the material did not have a vomit type odor to it suggesting that it did not clear the esophagus.  The patient presented with a possible oral dysphagia as he was slow orally to masticate dry solids.  No overt s/s of aspiration were seen.  The patient was noted to belch throughout entire assessment which both the patient and daughter reported as new.  Immediately following presentation of PO's the patient vomitted.  Again this did not have a vomit type odor to it.   Suspect possible esophageal issues.  Nurse to page MD.  Suggest that patient continue as able on a dysphagia 2 diet with thin liquids.  ST will follow up for possible need for involvement given possible RLL PNA per CXR.   SLP Visit Diagnosis: Dysphagia, oral phase (R13.11)    Aspiration  Risk  Mild aspiration risk    Diet Recommendation   Dysphagia 2 with thin liquids  Medication Administration: Whole meds with liquid    Other  Recommendations Oral Care Recommendations: Oral care BID   Follow up Recommendations Other (comment)(TBD)      Frequency and Duration min 2x/week  2 weeks       Prognosis Prognosis for Safe Diet Advancement: Fair      Swallow Study   General Date of Onset: 09/29/17 HPI: Anthony Nicholson is a  84 year old man with a history of prostate cancer and urinary retention with indwelling Foley catheter, chronic kidney disease stage III, hypothyroidism, and dysphagia who presented to the ED from [facility] where he was reported to have hemoptysis and dyspnea with decreased oxygen saturation to 80 to 90%.  During the previous day at his facility he developed the urinary complaints and was found to have an obstructed/nonfunctioning Foley catheter which had to be exchanged last night to resume urine output.  His family who was present earlier this morning states his current confusion is different from baseline and they believesimilar to previous mental status changes when suffering UTIs in the past.  Due to his confusion I am unable to get a accurate course of events at his facility from the patient during my interview.  At my exam his primary complaint is of ongoing suprapubic and penile pain. He also complains of constipation with no bowel movements in the past few days despite straining with attempts.  Most recent CXR is showing patchin RLL opacity with small pleural effusion suspicious for PNA.   Type of Study: Bedside Swallow Evaluation Previous Swallow Assessment: 06/09/2017 with recommendation for a dysphagia 2 diet with thin liquids.   Diet Prior to this Study: Dysphagia 2 (chopped);Thin liquids Temperature Spikes Noted: Yes Respiratory Status: Room air History of Recent Intubation: No Behavior/Cognition: Alert;Cooperative;Confused Oral Cavity Assessment: Within Functional Limits Oral Care Completed by SLP: No Oral Cavity - Dentition: Dentures, top Vision: Functional for self-feeding Self-Feeding Abilities: Able to feed self Patient Positioning: Upright in bed Baseline Vocal Quality: Normal Volitional Cough: Strong Volitional Swallow: Able to elicit    Oral/Motor/Sensory Function Overall Oral Motor/Sensory Function: Generalized oral weakness Facial ROM: Within Functional Limits Facial  Symmetry: Within Functional Limits Facial Strength: Within Functional Limits Lingual ROM: Within Functional Limits Lingual Symmetry: Within Functional Limits Lingual Strength: Reduced Mandible: Within Functional Limits   Ice Chips Ice chips: Not tested   Thin Liquid Thin Liquid: Within functional limits Presentation: Cup;Spoon;Self Fed    Nectar Thick Nectar Thick Liquid: Not tested   Honey Thick Honey Thick Liquid: Not tested   Puree Puree: Within functional limits Presentation: Spoon   Solid   GO   Solid: Impaired Presentation: Self Fed Oral Phase Impairments: Impaired mastication Oral Phase Functional Implications: Prolonged oral transit       Shelly Flatten, MA, CCC-SLP Acute Rehab SLP [phone] Lamar Sprinkles 09/29/2017,3:01 PM

## 2017-09-29 NOTE — Progress Notes (Signed)
Pharmacy Antibiotic Note  Anthony Nicholson is a 84 y.o. male admitted on 09/29/2017 with sepsis.  Pharmacy has been consulted for Vancomycin / Zosyn dosing.  Chronic indwelling foley - > recently changed  Vancomycin and Zosyn doses at 5 am  Plan: Zosyn 2.25 grams iv Q 6 hours Vancomycin 1 gram iv Q 48 hours Follow up cultures, Scr, LOT  Height: 5\' 6"  (167.6 cm) Weight: 136 lb (61.7 kg) IBW/kg (Calculated) : 63.8  Temp (24hrs), Avg:99.9 F (37.7 C), Min:98.2 F (36.8 C), Max:102.8 F (39.3 C)  Recent Labs  Lab 09/29/17 0420 09/29/17 0436 09/29/17 0639 09/29/17 1043  WBC 13.9*  --   --   --   CREATININE 1.86*  --   --   --   LATICACIDVEN  --  3.30* 2.50* 1.7    Estimated Creatinine Clearance: 19.8 mL/min (A) (by C-G formula based on SCr of 1.86 mg/dL (H)).    No Known Allergies    Thank you for allowing pharmacy to be a part of this patient's care. Anette Guarneri, PharmD [phone] 09/29/2017 3:28 PM

## 2017-09-29 NOTE — Social Work (Signed)
CSW aware pt is from [facility]. Will follow for disposition.  Weekend CSW can be reached at [phone] with any weekend needs.   [place], [place] Work [phone]

## 2017-09-29 NOTE — ED Provider Notes (Signed)
[facility] EMERGENCY DEPARTMENT Provider Note   CSN: [id number] Arrival date & time: 09/29/17  0359     History   Chief Complaint Chief Complaint  Patient presents with  . Shortness of Breath    HPI Anthony Nicholson is a 84 y.o. male.  The history is provided by the EMS personnel. The history is limited by the condition of the patient (Dementia).  He has history of chronic kidney disease and is brought in by ambulance after having reported to have coughed up blood on one occasion, and to have had low oxygen saturations.  Staff at the nursing facility where he resides reports oxygen saturations in the mid 80s.  EMS reports saturations have been in the 90s for them.  No known fever or chills.  Patient is not able to give any relevant history.  Past Medical History:  Diagnosis Date  . Basal cell carcinoma   . HOH (hard of hearing)   . Indwelling urinary catheter present   . Laceration of arm 02/04/2013    Patient Active Problem List   Diagnosis Date Noted  . Pressure injury of skin 06/07/2017  . Hematuria 06/06/2017  . Acute encephalopathy 06/06/2017  . CKD (chronic kidney disease) stage 3, GFR 30-59 ml/min (HCC) 06/06/2017  . Elevated troponin I level 12/12/2014  . Demand ischemia ([place])   . Sepsis ([place]) 12/09/2014  . Lower urinary tract infectious disease 12/09/2014  . Adenocarcinoma of prostate ([place]) 10/03/2014  . Acute renal failure syndrome ([place])   . Elevated PSA 10/01/2014  . Nodular prostate 10/01/2014  . Urinary retention   . Acute urinary retention 09/29/2014  . Acute renal failure ([place]) 09/29/2014  . Hyperkalemia 09/29/2014  . Anemia of chronic disease 09/29/2014  . Pain in lower limb 08/09/2013  . Onychomycosis 11/09/2012  . Pain in joint, ankle and foot 11/09/2012    Past Surgical History:  Procedure Laterality Date  .  sharpnel removal    1943    . CYSTOSCOPY N/A 10/02/2014   Procedure: FLEXIBLE CYSTOSCOPY;  Surgeon: Irine Seal, MD;   Location: [facility];  Service: Urology;  Laterality: N/A;  . I&D EXTREMITY Left 02/04/2013   Procedure: IRRIGATION AND DEBRIDEMENT Left Arm Laceration with Repair as Necessary;  Surgeon: Wylene Simmer, MD;  Location: [facility];  Service: Orthopedics;  Laterality: Left;  . INCISION AND DRAINAGE Left 02/04/2013   left arm laceration    Dr Doran Durand  . PROSTATE BIOPSY N/A 10/02/2014   Procedure: PROSTATE ULTRASOUND AND BIOPSY ;  Surgeon: Irine Seal, MD;  Location: [facility];  Service: Urology;  Laterality: N/A;        Home Medications    Prior to Admission medications   Medication Sig Start Date End Date Taking? Authorizing Provider  carboxymethylcellulose (REFRESH PLUS) 0.5 % SOLN Place 1 drop into both eyes 3 (three) times daily as needed (dry eye).    [provider]  cholecalciferol (VITAMIN D) 1000 units tablet Take 2,000 Units by mouth daily.    [provider]  clotrimazole (LOTRIMIN) 1 % cream Apply 1 application topically 2 (two) times daily.    [provider]  ENSURE (ENSURE) Take 237 mLs by mouth daily.    [provider]  folic acid (FOLVITE) 1 MG tablet Take 1 mg by mouth daily.    [provider]  levothyroxine (SYNTHROID, LEVOTHROID) 25 MCG tablet Take 25 mcg by mouth daily before breakfast.    [provider]  Menthol-Methyl Salicylate (MUSCLE RUB)  10-15 % CREA Apply 1 application topically as needed for muscle pain.    [provider]  polymixin-bacitracin (POLYSPORIN) 500-10000 UNIT/GM OINT ointment Apply 1 application topically every other day.    [provider]  saccharomyces boulardii (FLORASTOR) 250 MG capsule Take 1 capsule (250 mg total) by mouth 2 (two) times daily. 06/12/17   Florencia Reasons, MD  Skin Protectants, Misc. (EUCERIN) cream Apply 1 application topically as needed for dry skin.    [provider]  sodium bicarbonate 650 MG tablet Take 1 tablet (650 mg total) by mouth daily at 12 noon. 06/13/17   Florencia Reasons, MD  vitamin B-12 (CYANOCOBALAMIN) 500 MCG tablet Take 500 mcg by mouth daily.    [provider]    Family History Family History  Problem Relation Age of Onset  . Glaucoma Mother   . Stroke Father   . Prostate cancer Neg Hx     Social History Social History   Tobacco Use  . Smoking status: Never Smoker  . Smokeless tobacco: Never Used  Substance Use Topics  . Alcohol use: No    Alcohol/week: 0.0 oz  . Drug use: No     Allergies   Patient has no known allergies.   Review of Systems Review of Systems  Unable to perform ROS: Dementia     Physical Exam Updated Vital Signs BP (!) 105/91 (BP Location: Left Arm)   Pulse 100   Temp 98.2 F (36.8 C) (Oral)   Resp 20   Ht 5\' 6"  (1.676 m)   Wt 61.7 kg (136 lb)   SpO2 98%   BMI 21.95 kg/m   Physical Exam  Nursing note and vitals reviewed.  84 year old male, resting comfortably and in no acute distress. Vital signs are significant for fever and tachycardia. Oxygen saturation is 96%, which is normal. Head is normocephalic and atraumatic. PERRLA, EOMI. Oropharynx is clear. Neck is nontender and supple without adenopathy or JVD. Back is nontender and there is no CVA tenderness. Lungs are clear without rales, wheezes, or rhonchi. Chest is nontender. Heart has regular rate and rhythm without murmur. Abdomen is soft, flat, nontender without masses or hepatosplenomegaly and peristalsis is normoactive. Extremities have no cyanosis or edema, full range of motion is present. Skin is warm and dry without rash. Neurologic: Awake and oriented to person but not place or time, cranial nerves are intact, there are no motor or sensory deficits.  ED Treatments / Results  Labs (all labs ordered are listed, but only abnormal results are displayed) Labs Reviewed  MRSA PCR SCREENING - Abnormal; Notable for the following components:      Result Value   MRSA by PCR POSITIVE (*)    All other components within normal  limits  BLOOD CULTURE ID PANEL (REFLEXED) - Abnormal; Notable for the following components:   Enterobacteriaceae species DETECTED (*)    Proteus species DETECTED (*)    All other components within normal limits  D-DIMER, QUANTITATIVE (NOT AT [facility]) - Abnormal; Notable for the following components:   D-Dimer, Quant 18.79 (*)    All other components within normal limits  COMPREHENSIVE METABOLIC PANEL - Abnormal; Notable for the following components:   BUN 34 (*)    Creatinine, Ser 1.86 (*)    Calcium 8.0 (*)    Total Protein 5.6 (*)    Albumin 2.8 (*)    ALT 15 (*)    GFR calc non Af Amer 29 (*)    GFR  calc Af Amer 33 (*)    All other components within normal limits  CBC WITH DIFFERENTIAL/PLATELET - Abnormal; Notable for the following components:   WBC 13.9 (*)    RBC 3.47 (*)    Hemoglobin 10.0 (*)    HCT 32.0 (*)    Neutro Abs 13.6 (*)    Lymphs Abs 0.1 (*)    All other components within normal limits  URINALYSIS, ROUTINE W REFLEX MICROSCOPIC - Abnormal; Notable for the following components:   Color, Urine AMBER (*)    APPearance TURBID (*)    Hgb urine dipstick MODERATE (*)    Protein, ur >=300 (*)    Leukocytes, UA MODERATE (*)    RBC / HPF >50 (*)    WBC, UA >50 (*)    Bacteria, UA MANY (*)    All other components within normal limits  I-STAT CG4 LACTIC ACID, ED - Abnormal; Notable for the following components:   Lactic Acid, Venous 3.30 (*)    All other components within normal limits  I-STAT CG4 LACTIC ACID, ED - Abnormal; Notable for the following components:   Lactic Acid, Venous 2.50 (*)    All other components within normal limits  CULTURE, BLOOD (ROUTINE X 2)  CULTURE, BLOOD (ROUTINE X 2)  URINE CULTURE  PROTIME-INR  LACTIC ACID, PLASMA  CBC  BASIC METABOLIC PANEL  I-STAT TROPONIN, ED    EKG EKG Interpretation  Date/Time:  Friday September 29 2017 04:14:57 EDT Ventricular Rate:  118 PR Interval:    QRS Duration: 129 QT Interval:  337 QTC  Calculation: 473 R Axis:   55 Text Interpretation:  Sinus or ectopic atrial tachycardia Right bundle branch block Minimal ST elevation, anterolateral leads When compared with ECG of 06/06/2017, Premature ventricular complexes are no longer present Confirmed by Delora Fuel ([id number]) on 09/29/2017 4:34:10 AM Also confirmed by Delora Fuel ([id number]), editor Hattie Perch [phone])  on 09/29/2017 6:47:10 AM   Radiology Dg Abd 1 View  Result Date: 09/29/2017 CLINICAL DATA:  Encounter for emesis. EXAM: ABDOMEN - 1 VIEW COMPARISON:  None. FINDINGS: Distended mid abdominal small bowel loops could represent ileus or early partial obstruction. Air is seen in the RIGHT colon as well. Lumbar degenerative disc disease. Vascular calcification. Overlying telemetry leads. IMPRESSION: AP portable supine abdomen demonstrating distended mid abdominal small bowel loops which could represent early partial obstruction versus ileus. Continued surveillance is warranted. Electronically Signed   By: Staci Righter M.D.   On: 09/29/2017 15:17   Dg Chest Port 1 View  Result Date: 09/29/2017 CLINICAL DATA:  Shortness of breath. EXAM: PORTABLE CHEST 1 VIEW COMPARISON:  Radiographs 06/06/2017 FINDINGS: Patchy airspace disease throughout the right lower lung zone. Suspect small right pleural effusion. Unchanged heart size and mediastinal contours. Minimal left basilar atelectasis. No pneumothorax. Chronic change about the right shoulder. IMPRESSION: Patchy right lower lung zone opacity with small pleural effusion, suspicious for pneumonia. Electronically Signed   By: Jeb Levering M.D.   On: 09/29/2017 04:56    Procedures Procedures  CRITICAL CARE Performed by: Delora Fuel Total critical care time: 60 minutes Critical care time was exclusive of separately billable procedures and treating other patients. Critical care was necessary to treat or prevent imminent or life-threatening deterioration. Critical care was time spent  personally by me on the following activities: development of treatment plan with patient and/or surrogate as well as nursing, discussions with consultants, evaluation of patient's response to treatment, examination of patient, obtaining history from patient or surrogate,  ordering and performing treatments and interventions, ordering and review of laboratory studies, ordering and review of radiographic studies, pulse oximetry and re-evaluation of patient's condition.  Medications Ordered in ED Medications - No data to display   Initial Impression / Assessment and Plan / ED Course  I have reviewed the triage vital signs and the nursing notes.  Pertinent labs & imaging results that were available during my care of the patient were reviewed by me and considered in my medical decision making (see chart for details).  Report of hemoptysis and dyspnea.  On exam here, he is tachycardic but not hypoxic.  Will check chest x-ray and screening labs.  Because of rather sedentary lifestyle, will check d-dimer to make sure he does not have pulmonary embolism.  Old records are reviewed, and he has no relevant past visits.  Of note, he did come to the ED with DNR paperwork.  4:34 AM Temperature is noted to be elevated to 102.8.  He is started on sepsis pathway, started on empiric antibiotics.  Family is present and relates history of UTIs, but will start with vancomycin and Zosyn to treat sepsis of undetermined cause.  4:38 AM Lactic acid level has come back high.  Code sepsis is initiated, he is started on early, goal directed fluid therapy.  4:55 AM Family has arrived.  Patient apparently is normally alert and oriented and active, but has frequent UTIs.  He has a chronic indwelling catheter and it got clogged yesterday.  He frequently will get a UTI when the catheter gets clogged.  Catheter was changed last night.  Currently waiting for labs to come back to arrange hospital admission.  Labs show evidence of  acute kidney injury.  Urinalysis shows clear evidence of infection, but chest x-ray also appears to show right lower lobe pneumonia.  Either could be source of sepsis.  Initial lactic acid level was significantly elevated, and is trending down but not back to normal.  He is on appropriate antibiotics.  Normocytic anemia is present, unchanged from baseline. Arrangements are made for hospital admission.  Final Clinical Impressions(s) / ED Diagnoses   Final diagnoses:  Sepsis, due to unspecified organism [facility])  Urinary tract infection with hematuria, site unspecified  Community acquired pneumonia of right lower lobe of lung ([place])  Normochromic normocytic anemia  Acute kidney injury (nontraumatic) [facility])    ED Discharge Orders    None       Delora Fuel, MD 97/35/32 2250

## 2017-09-30 DIAGNOSIS — C61 Malignant neoplasm of prostate: Secondary | ICD-10-CM

## 2017-09-30 DIAGNOSIS — A4159 Other Gram-negative sepsis: Secondary | ICD-10-CM | POA: Diagnosis present

## 2017-09-30 DIAGNOSIS — K5900 Constipation, unspecified: Secondary | ICD-10-CM

## 2017-09-30 DIAGNOSIS — N183 Chronic kidney disease, stage 3 (moderate): Secondary | ICD-10-CM

## 2017-09-30 DIAGNOSIS — Z20818 Contact with and (suspected) exposure to other bacterial communicable diseases: Secondary | ICD-10-CM

## 2017-09-30 DIAGNOSIS — Z960 Presence of urogenital implants: Secondary | ICD-10-CM

## 2017-09-30 DIAGNOSIS — Z8546 Personal history of malignant neoplasm of prostate: Secondary | ICD-10-CM

## 2017-09-30 DIAGNOSIS — Z87440 Personal history of urinary (tract) infections: Secondary | ICD-10-CM

## 2017-09-30 DIAGNOSIS — E039 Hypothyroidism, unspecified: Secondary | ICD-10-CM

## 2017-09-30 DIAGNOSIS — B964 Proteus (mirabilis) (morganii) as the cause of diseases classified elsewhere: Secondary | ICD-10-CM

## 2017-09-30 DIAGNOSIS — R918 Other nonspecific abnormal finding of lung field: Secondary | ICD-10-CM

## 2017-09-30 DIAGNOSIS — Z66 Do not resuscitate: Secondary | ICD-10-CM

## 2017-09-30 DIAGNOSIS — R1310 Dysphagia, unspecified: Secondary | ICD-10-CM

## 2017-09-30 DIAGNOSIS — N179 Acute kidney failure, unspecified: Secondary | ICD-10-CM

## 2017-09-30 DIAGNOSIS — R339 Retention of urine, unspecified: Secondary | ICD-10-CM

## 2017-09-30 DIAGNOSIS — I4510 Unspecified right bundle-branch block: Secondary | ICD-10-CM

## 2017-09-30 LAB — CBC
HEMATOCRIT: 30.1 % — AB (ref 39.0–52.0)
HEMOGLOBIN: 9.1 g/dL — AB (ref 13.0–17.0)
MCH: 28.7 pg (ref 26.0–34.0)
MCHC: 30.2 g/dL (ref 30.0–36.0)
MCV: 95 fL (ref 78.0–100.0)
Platelets: 194 10*3/uL (ref 150–400)
RBC: 3.17 MIL/uL — ABNORMAL LOW (ref 4.22–5.81)
RDW: 14.2 % (ref 11.5–15.5)
WBC: 15.4 10*3/uL — ABNORMAL HIGH (ref 4.0–10.5)

## 2017-09-30 LAB — BASIC METABOLIC PANEL
Anion gap: 7 (ref 5–15)
BUN: 40 mg/dL — AB (ref 6–20)
CHLORIDE: 111 mmol/L (ref 101–111)
CO2: 22 mmol/L (ref 22–32)
CREATININE: 1.95 mg/dL — AB (ref 0.61–1.24)
Calcium: 7.7 mg/dL — ABNORMAL LOW (ref 8.9–10.3)
GFR calc Af Amer: 31 mL/min — ABNORMAL LOW (ref >60)
GFR calc non Af Amer: 27 mL/min — ABNORMAL LOW (ref >60)
Glucose, Bld: 79 mg/dL (ref 65–99)
Potassium: 4.2 mmol/L (ref 3.5–5.1)
SODIUM: 140 mmol/L (ref 135–145)

## 2017-09-30 LAB — URINE CULTURE

## 2017-09-30 LAB — C DIFFICILE QUICK SCREEN W PCR REFLEX
C DIFFICLE (CDIFF) ANTIGEN: POSITIVE — AB
C Diff toxin: NEGATIVE

## 2017-09-30 LAB — CLOSTRIDIUM DIFFICILE BY PCR, REFLEXED: Toxigenic C. Difficile by PCR: POSITIVE — AB

## 2017-09-30 MED ORDER — SODIUM CHLORIDE 0.9 % IV SOLN
1.5000 g | Freq: Two times a day (BID) | INTRAVENOUS | Status: DC
  Administered 2017-09-30: 1.5 g via INTRAVENOUS
  Filled 2017-09-30: qty 1.5

## 2017-09-30 MED ORDER — VANCOMYCIN HCL IN DEXTROSE 1-5 GM/200ML-% IV SOLN
1000.0000 mg | INTRAVENOUS | Status: DC
Start: 2017-09-30 — End: 2017-09-30
  Administered 2017-09-30: 1000 mg via INTRAVENOUS
  Filled 2017-09-30: qty 200

## 2017-09-30 MED ORDER — PIPERACILLIN-TAZOBACTAM IN DEX 2-0.25 GM/50ML IV SOLN
2.2500 g | Freq: Four times a day (QID) | INTRAVENOUS | Status: DC
  Administered 2017-09-30 – 2017-10-01 (×5): 2.25 g via INTRAVENOUS
  Filled 2017-09-30 (×5): qty 50

## 2017-09-30 NOTE — Progress Notes (Signed)
MRSA PCR positive, contact precautions initiated.

## 2017-09-30 NOTE — Evaluation (Signed)
Physical Therapy Evaluation Patient Details Name: Anthony Nicholson MRN: [id number] DOB: 06-20-1917 Today's Date: 09/30/2017   History of Present Illness  Pt is a 84 y/o [employer] veteran with a PMH significant for urinary obstruction from prostate cancer with chronic indwelling foley catheter, CKD stage III, hypothyroidism, and baseline dysphagia who presents with suprapubic tenderness, hemoptysis, fever, AMS, leukocytosis, and elevated lactic acid concerning for urosepsis and questionable aspiration PNA.   Clinical Impression  Pt admitted with above diagnosis. Pt currently with functional limitations due to the deficits listed below (see PT Problem List). At the time of PT eval pt was able to perform transfers with up to +2 mod assist for balance support and safety. Pt reports he was ambulating well PTA with RW and is eager to progress ambulation while in the hospital. Sats varying between 84-99% on 2L/min supplemental O2. Unsure how accurate O2 was during mobility however pt did not appear SOB. Pt will benefit from skilled PT to increase their independence and safety with mobility to allow discharge to the venue listed below.       Follow Up Recommendations SNF;Supervision/Assistance - 24 hour    Equipment Recommendations  None recommended by PT    Recommendations for Other Services       Precautions / Restrictions Precautions Precautions: Fall Restrictions Weight Bearing Restrictions: No      Mobility  Bed Mobility Overal bed mobility: Needs Assistance Bed Mobility: Supine to Sit     Supine to sit: Mod assist;+2 for physical assistance;HOB elevated     General bed mobility comments: Assist for LE movement towards EOB as well as trunk elevation to uprigh sitting position.   Transfers Overall transfer level: Needs assistance Equipment used: Rolling walker (2 wheeled) Transfers: Sit to/from [facility] Sit to Stand: Min assist;+2 safety/equipment Stand pivot  transfers: Min assist;+2 safety/equipment       General transfer comment: Pt demonstrated proper hand placement on seated surface for safety. Min assist for power-up to full stand and to maintain standing while peri-care was performed. Pt was able to take a few pivotal steps around to the chair with assist for balance and walker management.   Ambulation/Gait             General Gait Details: Not able to progress due to bowel incontinence at this time.   Stairs            Wheelchair Mobility    Modified Rankin (Stroke Patients Only)       Balance Overall balance assessment: Needs assistance Sitting-balance support: Feet supported;Bilateral upper extremity supported Sitting balance-Leahy Scale: Poor Sitting balance - Comments: Fair, and then with challenge of LE MMT pt demonstrated posterior lean   Standing balance support: Bilateral upper extremity supported;During functional activity Standing balance-Leahy Scale: Poor                               Pertinent Vitals/Pain Pain Assessment: No/denies pain    Home Living Family/patient expects to be discharged to:: Skilled nursing facility                 Additional Comments: Per chart review, pt resides at [facility] - questionable SNF vs ALF.    Prior Function Level of Independence: Needs assistance   Gait / Transfers Assistance Needed: Pt reports he ambulated with a RW PTA  ADL's / Homemaking Assistance Needed: Assist for bathing, dressing  Hand Dominance   Dominant Hand: Right    Extremity/Trunk Assessment   Upper Extremity Assessment Upper Extremity Assessment: LUE deficits/detail LUE Deficits / Details: Pt reports baseline shoulder pain. Noted bilateral generalized weakess (feel it is age appropriate), and significant rounded shoulder posture with notable abduction of the scapulae.     Lower Extremity Assessment Lower Extremity Assessment: Overall WFL for tasks  assessed(Decreased strength however feel it is age appropriate)    Cervical / Trunk Assessment Cervical / Trunk Assessment: Kyphotic  Communication   Communication: HOH;Other (comment)(Garbled speech at times and difficult to understand)  Cognition Arousal/Alertness: Awake/alert Behavior During Therapy: WFL for tasks assessed/performed Overall Cognitive Status: No family/caregiver present to determine baseline cognitive functioning                                 General Comments: Sleepy initially however was able to be aroused and pt was able to maintain alrertness once mobility began.       General Comments      Exercises     Assessment/Plan    PT Assessment Patient needs continued PT services  PT Problem List Decreased strength;Decreased range of motion;Decreased activity tolerance;Decreased balance;Decreased mobility;Decreased knowledge of use of DME;Decreased safety awareness;Decreased knowledge of precautions       PT Treatment Interventions Gait training;DME instruction;Stair training;Functional mobility training;Therapeutic activities;Therapeutic exercise;Neuromuscular re-education;Patient/family education    PT Goals (Current goals can be found in the Care Plan section)  Acute Rehab PT Goals Patient Stated Goal: Walk more with therapy PT Goal Formulation: With patient Time For Goal Achievement: 10/07/17 Potential to Achieve Goals: Good    Frequency Min 2X/week(more times a week if able)   Barriers to discharge        Co-evaluation               AM-PAC PT "6 Clicks" Daily Activity  Outcome Measure Difficulty turning over in bed (including adjusting bedclothes, sheets and blankets)?: A Little Difficulty moving from lying on back to sitting on the side of the bed? : A Lot Difficulty sitting down on and standing up from a chair with arms (e.g., wheelchair, bedside commode, etc,.)?: A Little Help needed moving to and from a bed to chair  (including a wheelchair)?: A Little Help needed walking in hospital room?: A Lot Help needed climbing 3-5 steps with a railing? : Total 6 Click Score: 14    End of Session Equipment Utilized During Treatment: Gait belt Activity Tolerance: Patient tolerated treatment well Patient left: in chair;with call bell/phone within reach;with chair alarm set;with family/visitor present;with nursing/sitter in room Nurse Communication: Mobility status PT Visit Diagnosis: Unsteadiness on feet (R26.81);Other abnormalities of gait and mobility (R26.89);Muscle weakness (generalized) (M62.81)    Time: 1100-1130 PT Time Calculation (min) (ACUTE ONLY): 30 min   Charges:   PT Evaluation $PT Eval Moderate Complexity: 1 Mod PT Treatments $Gait Training: 8-22 mins   PT G Codes:        Rolinda Roan, PT, DPT Acute Rehabilitation Services Pager: [phone]   Thelma Comp 09/30/2017, 12:28 PM

## 2017-09-30 NOTE — Progress Notes (Addendum)
Subjective:  Anthony Nicholson was seen lying comfortably in bed this morning. He says that his suprapubic pain and penile pain has resolved since yesterday. He still has a mild cough but no more vomiting, hemoptysis or dyspnea. He had a bowel movement last night. His daughter states that he seems more alert and less confused than yesterday. He would like to get up and walk around with physical therapy today if possible. He denies any chest pain, dysuria, fevers, chills, or headaches.   Objective:  Vital signs in last 24 hours: Vitals:   09/29/17 1548 09/29/17 2212 09/30/17 0637 09/30/17 0842  BP:  (!) 105/91 (!) 134/96   Pulse:  100 (!) 102   Resp:  20 19   Temp: 98.1 F (36.7 C) 98.2 F (36.8 C) 98.2 F (36.8 C)   TempSrc: Oral Oral Oral   SpO2:  98% 100% 100%  Weight:      Height:       Weight change:   Intake/Output Summary (Last 24 hours) at 09/30/2017 1035 Last data filed at 09/30/2017 0347 Gross per 24 hour  Intake 901.66 ml  Output 1025 ml  Net -123.34 ml   GENERAL- Frail appearing man in no acute distress HEENT- Moist oral mucosa, bilateral hearing aids in place, no cervical lymphadenopathy. CARDIAC- Normal rate, regular rhythm, no murmur. Distal pulses 2+ bilaterally. No LE edema. No JVD. Capillary refill <3 sec in distal phalanges. RESP- Breathing comfortably on nasal cannula. Symmetric chest wall expansion. Decreased breath sounds bilaterally. No wheezes, crackles, or rhonchi. Occasional cough. ABDOMEN- Normoactive bowel sounds. No masses on palpation. Mild suprapubic tennderness to palpation.  NEURO- A&Ox3. PERRLA. EOM's intact. Strength 5/5 in upper and lower extremities, bilaterally.  EXTREMITIES- senile purpura on forearms, thin but appropriate strength SKIN- Diffuse actinic keratoses, bruising, no rashes.  Assessment/Plan: Active Problems: Sepsis with Proteus bacteremia AKI on CKD stage 3 Prostate adenocarcinoma Dysphagia  84 year old male with PMH of urinary  obstruction from prostate cancer with chronic indwelling Foley catheter, CKD stage III, hypothyroidism, and baseline dysphagia who presents with suprapubic tenderness, hemoptysis, fever, AMS, leukocytosis, and elevated lactic acid concerning for urosepsis and questionable aspiration pneumonia. Blood cultures positive for Proteus species.  Sepsis with Proteus bacteremia- Likely urosepsis given suprapubic tenderness, chronic indwelling Foley catheter, and Proteus positive blood cultures. This morning, lactic acid normalized to 1.7, WBC still high at 15.4, Cr slightly increased to 1.95 but fevers resolved and blood pressures are improving. Patient received 3.5L normal saline and empiric vancomycin/zosyn in the ED yesterday. May have concomitant pneumonia given dysphagia, history of hemoptysis, and new RLL infiltrate on CXR yesterday. Will narrow to just Zosyn given blood cultures positive for Proteus species however would resume Vancomycin should his clinical status change.  -IV Zosyn -IV NS _0  mL/hr -BMP and CBC in AM -Resume Vancomycin if clinical status deteriorates for possible aspiration PNA  AKI on CKD stage 3- On admission Cr 1.86, BUN 34, eGFR 29 up from baseline Cr ~1.0, eGFR>60 per chart review. Likely 2/2 to prerenal injury from sepsis/hypotension however did get Vanc/Zosyn in ED. Cr slightly up at 1.95 this morning but having good urine output from Coude catheter. Expect improvement in AKI with continued fluid administration. -IVF as discussed above -Daily BMP  Prostate adenocarcinoma- Diagnosed with prostate adenocarcinoma, Gleason 7, back in 2016 and underwent androgen deprivation therapy at the time, no surgery or radiation. Has a chronic indwelling Foley catheter for urinary retention 2/2 to prostate adenocarcinoma. Replaced with a Coude  catheter yesterday. No hematuria or dysuria this morning and having good urine output. Will continue to follow I/Os closely.   Dysphagia- Patient was on  a dysphagia 2 diet at his ALF. Swallow evaluation yesterday was concerning for both oral and esophageal dysphagia, and they recommended continued dysphagia 2 diet with thin liquids.   FEN: NS _0 /hr, replete lytes prn, dysphagia 2 diet VTE ppx: Lovenox  Code Status: DNR  Dispo: Anticipate >2 midnight stay for treatment of sepsis with IV antibiotics.    LOS: 1 day   Thornton Papas, Medical Student 09/30/2017, 10:35 AM Pager: [phone]  Attestation for Student Documentation:  I personally was present and performed or re-performed the history, physical exam and medical decision-making activities of this service and have verified that the service and findings are accurately documented in the student's note.  Dawnisha Marquina, DO 09/30/2017, 11:27 AM

## 2017-09-30 NOTE — Progress Notes (Addendum)
Pharmacy Antibiotic Note  Anthony Nicholson is a 84 y.o. male admitted on 09/29/2017 with penile and suprapubic pain.  He has a chronic indwelling foley that was recently changed.  Pharmacy has been consulted to switch antibiotics to Zosyn monotherapy for aspiration PNA and Proteus bacteremia.    Patient has AKI - SCr 1.95, CrCL 19 ml/min.  Now afebrile, WBC increased to 15.4, LA 1.7.   Plan: Zosyn 2.25gm IV Q6H Monitor renal fxn, clinical status to de-escalateMonitor renal fxn, clinical status to de-escalate F/u work-up for elevated d-dimer (18.79)   Height: 5\' 6"  (167.6 cm) Weight: 136 lb (61.7 kg) IBW/kg (Calculated) : 63.8  Temp (24hrs), Avg:98.2 F (36.8 C), Min:98.1 F (36.7 C), Max:98.2 F (36.8 C)  Recent Labs  Lab 09/29/17 0420 09/29/17 0436 09/29/17 0639 09/29/17 1043 09/30/17 0627  WBC 13.9*  --   --   --  15.4*  CREATININE 1.86*  --   --   --  1.95*  LATICACIDVEN  --  3.30* 2.50* 1.7  --     Estimated Creatinine Clearance: 18.9 mL/min (A) (by C-G formula based on SCr of 1.95 mg/dL (H)).    No Known Allergies   Vanc 6/14 >> 6/15 Unasyn 6/15 >> 6/15 Zosyn 6/14 >> 6/14, 6/15 >>  CTX 6/14 >> 6/15  6/14 MRSA PCR - positive 6/14 BCx - GNR (BCID Proteus) 6/14 UCx -  6/15 C.diff quick scan - Ag positive 6/15 C.diff PCR -    Anthony Nicholson D. Mina Marble, PharmD, BCPS, BCCCP Pager:  [phone] 09/30/2017, 8:13 AM

## 2017-09-30 NOTE — Plan of Care (Signed)
  Problem: Activity: Goal: Risk for activity intolerance will decrease Outcome: Progressing   Problem: Nutrition: Goal: Adequate nutrition will be maintained Outcome: Progressing   Problem: Elimination: Goal: Will not experience complications related to urinary retention Outcome: Progressing   

## 2017-10-01 ENCOUNTER — Inpatient Hospital Stay (HOSPITAL_COMMUNITY): Payer: Medicare HMO

## 2017-10-01 LAB — CULTURE, BLOOD (ROUTINE X 2)

## 2017-10-01 LAB — BASIC METABOLIC PANEL
ANION GAP: 5 (ref 5–15)
BUN: 37 mg/dL — ABNORMAL HIGH (ref 6–20)
CHLORIDE: 112 mmol/L — AB (ref 101–111)
CO2: 21 mmol/L — ABNORMAL LOW (ref 22–32)
Calcium: 7.4 mg/dL — ABNORMAL LOW (ref 8.9–10.3)
Creatinine, Ser: 1.3 mg/dL — ABNORMAL HIGH (ref 0.61–1.24)
GFR calc Af Amer: 51 mL/min — ABNORMAL LOW (ref >60)
GFR, EST NON AFRICAN AMERICAN: 44 mL/min — AB (ref >60)
Glucose, Bld: 95 mg/dL (ref 65–99)
Potassium: 3.7 mmol/L (ref 3.5–5.1)
SODIUM: 138 mmol/L (ref 135–145)

## 2017-10-01 LAB — CBC
HCT: 28 % — ABNORMAL LOW (ref 39.0–52.0)
HEMOGLOBIN: 8.4 g/dL — AB (ref 13.0–17.0)
MCH: 28.8 pg (ref 26.0–34.0)
MCHC: 30 g/dL (ref 30.0–36.0)
MCV: 95.9 fL (ref 78.0–100.0)
PLATELETS: 185 10*3/uL (ref 150–400)
RBC: 2.92 MIL/uL — ABNORMAL LOW (ref 4.22–5.81)
RDW: 14 % (ref 11.5–15.5)
WBC: 12.4 10*3/uL — AB (ref 4.0–10.5)

## 2017-10-01 MED ORDER — PIPERACILLIN-TAZOBACTAM 3.375 G IVPB
3.3750 g | Freq: Three times a day (TID) | INTRAVENOUS | Status: DC
Start: 2017-10-01 — End: 2017-10-04
  Administered 2017-10-01 – 2017-10-04 (×9): 3.375 g via INTRAVENOUS
  Filled 2017-10-01 (×9): qty 50

## 2017-10-01 NOTE — Progress Notes (Addendum)
Subjective:   Patient seen and evaluated at bedside. Feels well but does complain of his stomach hurting. Pt notes drinking some water and an ice chip and threw it up shortly after. Had a few BMs overnight. Denies any fevers, chills, penile pain and feels that his catheter is flowing well.   Objective:  Vital signs in last 24 hours: Vitals:   09/30/17 1316 09/30/17 1343 09/30/17 2227 10/01/17 0536  BP: (!) 91/42 116/83 100/71 117/72  Pulse: 90  (!) 101 89  Resp: 18  17 18   Temp: 97.9 F (36.6 C)  97.9 F (36.6 C) 98.6 F (37 C)  TempSrc: Oral  Oral Oral  SpO2: 100%  100% 100%  Weight:      Height:       Weight change:   Intake/Output Summary (Last 24 hours) at 10/01/2017 0931 Last data filed at 10/01/2017 6160 Gross per 24 hour  Intake 1008.75 ml  Output 1300 ml  Net -291.25 ml   GENERAL- Frail but pleasant elderly caucasian man in no acute distress; conversant HEENT- Moist oral mucosa, bilateral hearing aids in place, no cervical lymphadenopathy. CARDIAC- Normal rate, regular rhythm, no murmur. Distal pulses 2+ bilaterally. No LE edema. No JVD. Capillary refill <3 sec in distal phalanges. RESP- Breathing comfortably on nasal cannula. Symmetric chest wall expansion. No wheezes, crackles, or rhonchi. Occasional cough. ABDOMEN- Distended abdomen, hypoactive bowel sounds. Mild-mod TTP NEURO- A&Ox3. PERRLA. EOM's intact. Moves all extremities appropriately. SKIN- Diffuse actinic keratoses, bruising, no rashes.   Assessment/Plan: Active Problems: Sepsis with Proteus bacteremia AKI on CKD stage 3 Prostate adenocarcinoma Dysphagia  84 year old male with PMH of urinary obstruction from prostate cancer with chronic indwelling Foley catheter, CKD stage III, hypothyroidism, and baseline dysphagia who presents with suprapubic tenderness, hemoptysis, fever, AMS, leukocytosis, and elevated lactic acid concerning for urosepsis and questionable aspiration pneumonia. Blood cultures  positive for Proteus species.  Sepsis, Proteus bacteremia- Proteus bacteremia from indwelling foley with frequent instrumentation. Fever and tachycardia has resolved, BP now normal. Awaiting sensitivities from blood culture results and will narrow based on these. Also, there was concern for possible aspiration PNA and will need to consider if this is still thought to be contributing to his septic presentation. He is still requiring supplemental O2 and will work on weaning this as able today as able.  -IV Zosyn; narrow based on culture results -IV ns @125mls /hr - will continue at current rate for now given N/V -Delirium precautions -Resume Vancomycin should status deteriorate given his +MRSA screen and ?asp risk  AKI on CKD stage 3- Renal function has improved significantly overnight; Cr 1.95 -> 1.3 this morning. Lytes grossly stable and he is having good UOP from Coude catheter. Expect continued improvement with IVF and holding nephrotoxic meds as able.   Prostate adenocarcinoma- Diagnosed with prostate adenocarcinoma, Gleason 7, back in 2016 and underwent androgen deprivation therapy at the time, no surgery or radiation. Has a chronic indwelling Foley catheter for urinary retention and replaced with a Coude catheter 6/14. No hematuria or dysuria this morning and having good urine output. Will continue to follow I/Os closely.   Dysphagia- Swallow evaluation recommended continued dysphagia 2 diet with thin liquids, which he was on at ALF.  Constipation, Abdominal Pain: Long history of constipation but still had some BMs overnight. Had brown emesis on admission and KUB with ?ileus vs partial SBO. He had several BMs and resolution of emesis since then however did have another episode of emesis overnight. His  exam does show some TTP throughout the abdomen. Will order KUB for clarification. NPO for now.   **C. Diff Ag positive, Toxigenic C. Diff Ag positive, Negative C. Diff Toxin:  He is from a facility  and has been there for several years and is colonized. Studies obtained suggest patient is colonized with toxigenic C diff strain but TOXIN was negative - indicating he is likely not producing the actual C. Diff toxin currently. No excessive diarrhea and his leukocytosis is improving.   **Asked to comment on D-dimer: D-dimer obtained in ED 18. His presentation is not consistent with PE. Plts stable and Hb trending down slowly 2/2 IVF. Differential for an elevated D-dimer is broad and most likely related to his current infectious process and underlying chronic diseases.   FEN: NS @125cc /hr, replete lytes prn, dysphagia 2 diet VTE ppx: Lovenox  Code Status: DNR  Dispo: Anticipate discharge in 2 days back to [facility] (SNF; came in from ALF)   LOS: 2 days   Asar Evilsizer, DO 10/01/2017, 7:46 AM Pager: [phone]

## 2017-10-01 NOTE — Progress Notes (Signed)
Pharmacy Antibiotic Note  Anthony Nicholson is a 84 y.o. male admitted on 09/29/2017 with penile and suprapubic pain.  He has a chronic indwelling foley that was recently changed.  Pharmacy has been consulted to switch antibiotics to Zosyn monotherapy for aspiration PNA and Proteus bacteremia.    Renal function is improving.  Now afebrile, WBC down to 12.4, LA down 1.7.   Plan: Increase Zosyn to EID 3.375gm IV Q8H Monitor renal fxn, micro data/clinical status to de-escalate  F/u work-up for elevated d-dimer (18.79)   Height: 5\' 6"  (167.6 cm) Weight: 136 lb (61.7 kg) IBW/kg (Calculated) : 63.8  Temp (24hrs), Avg:98.1 F (36.7 C), Min:97.9 F (36.6 C), Max:98.6 F (37 C)  Recent Labs  Lab 09/29/17 0420 09/29/17 0436 09/29/17 0639 09/29/17 1043 09/30/17 0627 10/01/17 0526  WBC 13.9*  --   --   --  15.4* 12.4*  CREATININE 1.86*  --   --   --  1.95* 1.30*  LATICACIDVEN  --  3.30* 2.50* 1.7  --   --     Estimated Creatinine Clearance: 28.3 mL/min (A) (by C-G formula based on SCr of 1.3 mg/dL (H)).    No Known Allergies   Vanc 6/14 >> 6/15 Unasyn 6/15 >> 6/15 Zosyn 6/14 >> 6/14, 6/15 >>  CTX 6/14 >> 6/15  6/14 MRSA PCR - positive 6/14 BCx - Proteus (preliminary) 6/14 UCx - suggest recollection 6/15 C.diff quick scan - Ag positive 6/15 C.diff PCR - positive (little to no toxin production, tx only if clinically relevant)   Anthony Nicholson D. Mina Marble, PharmD, BCPS, BCCCP Pager:  [phone] 10/01/2017, 8:13 AM

## 2017-10-02 ENCOUNTER — Inpatient Hospital Stay (HOSPITAL_COMMUNITY): Payer: Medicare HMO

## 2017-10-02 ENCOUNTER — Encounter (HOSPITAL_COMMUNITY): Payer: Self-pay | Admitting: General Surgery

## 2017-10-02 DIAGNOSIS — R195 Other fecal abnormalities: Secondary | ICD-10-CM

## 2017-10-02 DIAGNOSIS — C61 Malignant neoplasm of prostate: Secondary | ICD-10-CM

## 2017-10-02 DIAGNOSIS — D649 Anemia, unspecified: Secondary | ICD-10-CM

## 2017-10-02 DIAGNOSIS — R011 Cardiac murmur, unspecified: Secondary | ICD-10-CM

## 2017-10-02 DIAGNOSIS — R935 Abnormal findings on diagnostic imaging of other abdominal regions, including retroperitoneum: Secondary | ICD-10-CM

## 2017-10-02 DIAGNOSIS — Z221 Carrier of other intestinal infectious diseases: Secondary | ICD-10-CM

## 2017-10-02 DIAGNOSIS — Z9889 Other specified postprocedural states: Secondary | ICD-10-CM

## 2017-10-02 DIAGNOSIS — R338 Other retention of urine: Secondary | ICD-10-CM

## 2017-10-02 DIAGNOSIS — R1110 Vomiting, unspecified: Secondary | ICD-10-CM

## 2017-10-02 DIAGNOSIS — R7881 Bacteremia: Secondary | ICD-10-CM

## 2017-10-02 DIAGNOSIS — D500 Iron deficiency anemia secondary to blood loss (chronic): Secondary | ICD-10-CM

## 2017-10-02 DIAGNOSIS — K9289 Other specified diseases of the digestive system: Secondary | ICD-10-CM

## 2017-10-02 DIAGNOSIS — K920 Hematemesis: Secondary | ICD-10-CM

## 2017-10-02 LAB — CBC
HCT: 21.4 % — ABNORMAL LOW (ref 39.0–52.0)
Hemoglobin: 6.4 g/dL — CL (ref 13.0–17.0)
MCH: 29.4 pg (ref 26.0–34.0)
MCHC: 29.9 g/dL — ABNORMAL LOW (ref 30.0–36.0)
MCV: 98.2 fL (ref 78.0–100.0)
PLATELETS: 207 10*3/uL (ref 150–400)
RBC: 2.18 MIL/uL — ABNORMAL LOW (ref 4.22–5.81)
RDW: 14.2 % (ref 11.5–15.5)
WBC: 9.6 10*3/uL (ref 4.0–10.5)

## 2017-10-02 LAB — BASIC METABOLIC PANEL
ANION GAP: 6 (ref 5–15)
BUN: 47 mg/dL — ABNORMAL HIGH (ref 6–20)
CALCIUM: 7.5 mg/dL — AB (ref 8.9–10.3)
CO2: 21 mmol/L — ABNORMAL LOW (ref 22–32)
CREATININE: 1.13 mg/dL (ref 0.61–1.24)
Chloride: 117 mmol/L — ABNORMAL HIGH (ref 101–111)
GFR calc Af Amer: >60 mL/min (ref >60)
GFR, EST NON AFRICAN AMERICAN: 52 mL/min — AB (ref >60)
GLUCOSE: 108 mg/dL — AB (ref 65–99)
Potassium: 3.8 mmol/L (ref 3.5–5.1)
Sodium: 144 mmol/L (ref 135–145)

## 2017-10-02 LAB — CBC WITH DIFFERENTIAL/PLATELET
ABS IMMATURE GRANULOCYTES: 0 10*3/uL (ref 0.0–0.1)
Basophils Absolute: 0 10*3/uL (ref 0.0–0.1)
Basophils Relative: 0 %
Eosinophils Absolute: 0.2 10*3/uL (ref 0.0–0.7)
Eosinophils Relative: 3 %
HEMATOCRIT: 20.3 % — AB (ref 39.0–52.0)
HEMOGLOBIN: 6 g/dL — AB (ref 13.0–17.0)
IMMATURE GRANULOCYTES: 1 %
LYMPHS ABS: 0.5 10*3/uL — AB (ref 0.7–4.0)
LYMPHS PCT: 6 %
MCH: 28.8 pg (ref 26.0–34.0)
MCHC: 29.6 g/dL — ABNORMAL LOW (ref 30.0–36.0)
MCV: 97.6 fL (ref 78.0–100.0)
MONO ABS: 0.4 10*3/uL (ref 0.1–1.0)
Monocytes Relative: 5 %
NEUTROS ABS: 6.7 10*3/uL (ref 1.7–7.7)
NEUTROS PCT: 85 %
PLATELETS: 201 10*3/uL (ref 150–400)
RBC: 2.08 MIL/uL — ABNORMAL LOW (ref 4.22–5.81)
RDW: 14.2 % (ref 11.5–15.5)
WBC: 7.9 10*3/uL (ref 4.0–10.5)

## 2017-10-02 LAB — PREPARE RBC (CROSSMATCH)

## 2017-10-02 LAB — HEMOGLOBIN AND HEMATOCRIT, BLOOD
HEMATOCRIT: 32.3 % — AB (ref 39.0–52.0)
Hemoglobin: 9.9 g/dL — ABNORMAL LOW (ref 13.0–17.0)

## 2017-10-02 LAB — CULTURE, BLOOD (ROUTINE X 2): Special Requests: ADEQUATE

## 2017-10-02 LAB — ABO/RH: ABO/RH(D): A POS

## 2017-10-02 LAB — LACTATE DEHYDROGENASE: LDH: 109 U/L (ref 98–192)

## 2017-10-02 LAB — OCCULT BLOOD X 1 CARD TO LAB, STOOL: Fecal Occult Bld: POSITIVE — AB

## 2017-10-02 MED ORDER — POTASSIUM CHLORIDE 10 MEQ/100ML IV SOLN
10.0000 meq | INTRAVENOUS | Status: AC
Start: 2017-10-02 — End: 2017-10-02
  Administered 2017-10-02: 10 meq via INTRAVENOUS

## 2017-10-02 MED ORDER — ORAL CARE MOUTH RINSE
15.0000 mL | Freq: Two times a day (BID) | OROMUCOSAL | Status: DC
  Administered 2017-10-02 – 2017-10-08 (×10): 15 mL via OROMUCOSAL

## 2017-10-02 MED ORDER — BENZONATATE 100 MG PO CAPS: 100.0000 mg | ORAL_CAPSULE | Freq: Three times a day (TID) | ORAL | Status: DC | PRN

## 2017-10-02 MED ORDER — POTASSIUM CHLORIDE 10 MEQ/100ML IV SOLN
10.0000 meq | INTRAVENOUS | Status: AC
  Filled 2017-10-02: qty 100

## 2017-10-02 MED ORDER — IOPAMIDOL (ISOVUE-300) INJECTION 61%
INTRAVENOUS | Status: AC
  Filled 2017-10-02: qty 30

## 2017-10-02 MED ORDER — IPRATROPIUM-ALBUTEROL 0.5-2.5 (3) MG/3ML IN SOLN
3.0000 mL | Freq: Four times a day (QID) | RESPIRATORY_TRACT | Status: AC
  Administered 2017-10-02: 3 mL via RESPIRATORY_TRACT
  Filled 2017-10-02: qty 3

## 2017-10-02 MED ORDER — PANTOPRAZOLE SODIUM 40 MG IV SOLR
40.0000 mg | Freq: Two times a day (BID) | INTRAVENOUS | Status: DC
  Administered 2017-10-02 – 2017-10-04 (×6): 40 mg via INTRAVENOUS
  Filled 2017-10-02 (×7): qty 40

## 2017-10-02 MED ORDER — SODIUM CHLORIDE 0.9 % IV SOLN
Freq: Once | INTRAVENOUS | Status: AC
Start: 2017-10-02 — End: 2017-10-03
  Administered 2017-10-02: 14:00:00 via INTRAVENOUS

## 2017-10-02 NOTE — Consult Note (Addendum)
[facility] Consult: 12:58 PM 10/02/2017  LOS: 3 days    Referring Provider: Dr Dareen Piano.    Primary Care Physician: PCP is Knox Saliva NP at [facility] in [place] Primary Gastroenterologist: unassigned     Reason for Consultation:  Acute anemia and dark emesis.  SBO   HPI: Anthony Nicholson is a 84 y.o. male.  Patient has been a resident of [facility] assisted living since he was admitted with urosepsis in 05/2017.Marland Kitchen No past GI hx.  Past hx of prostate ca, treated with hormonal therapy. Cystitis.  Indwelling foley catheter.  Normocytic anemia.  Hgb to 9.8 >> 7.3 on 06/10/17, drop attributed to hematuria due to mal-positioned foley catheter; received 1 U PRBCs.  Via the [place], in 2018 he was transfused with blood and received parenteral iron infusion CKD stage 3.  Hypothyroidism.    Admitted with sepsis, proteus bacteremia and ? CAP.  AKI improved.   At his nursing facility he had oral bleeding.  On exam once he got to the ED it was determined that he had bitten the left side of his tongue.  That bleeding has resolved.  On the morning of  SLP performed swallow eval and recommend D2 diet with thins.   6/15 he started spitting up/vomiting small amounts of brownish, not grossly bloody, liquid whenever he was given p.o., including his meds.  He is having dark, watery stools.  These test FOBT +.  No NSAIDs as outpt.   Long standing constipation.  Brown emesis reported PTA.  C/o abdominal pain.   C. Diff Ag +, Toxigenic C. Diff Ag +, C. Diff Toxin negative.  KUB #1, 6/14: distended mid abdominal small bowel loops which could represent early partial obstruction versus ileus. KUB #2 6/16: Increasing gaseous distention of small bowel loops in the abdomen concerning for small bowel obstruction Hgb 10 >> 9.1 >> 8.4 >> 6.   MCV 97.  Patient has had no abdominal pelvic surgeries.  The shrapnel wounds which he had in the 1940s were to his back. He is never undergone upper endoscopy or colonoscopy to his family's knowledge.      Past Medical History:  Diagnosis Date  . Basal cell carcinoma   . HOH (hard of hearing)   . Indwelling urinary catheter present   . Laceration of arm 02/04/2013    Past Surgical History:  Procedure Laterality Date  .  sharpnel removal    1943    . CYSTOSCOPY N/A 10/02/2014   Procedure: FLEXIBLE CYSTOSCOPY;  Surgeon: Irine Seal, MD;  Location: [facility];  Service: Urology;  Laterality: N/A;  . I&D EXTREMITY Left 02/04/2013   Procedure: IRRIGATION AND DEBRIDEMENT Left Arm Laceration with Repair as Necessary;  Surgeon: Wylene Simmer, MD;  Location: [facility];  Service: Orthopedics;  Laterality: Left;  . INCISION AND DRAINAGE Left 02/04/2013   left arm laceration    Dr Doran Durand  . PROSTATE BIOPSY N/A 10/02/2014   Procedure: PROSTATE ULTRASOUND AND BIOPSY ;  Surgeon: Irine Seal, MD;  Location: [facility];  Service: Urology;  Laterality:  N/A;    Prior to Admission medications   Medication Sig Start Date End Date Taking? Authorizing Provider  acetaminophen (TYLENOL) 325 MG tablet Take 650 mg by mouth every 6 (six) hours as needed for mild pain.   Yes [provider]  carboxymethylcellulose (REFRESH PLUS) 0.5 % SOLN Place 1 drop into both eyes 3 (three) times daily as needed (dry eye).   Yes [provider]  cholecalciferol (VITAMIN D) 1000 units tablet Take 2,000 Units by mouth daily.   Yes [provider]  folic acid (FOLVITE) 1 MG tablet Take 1 mg by mouth daily.   Yes [provider]  levothyroxine (SYNTHROID, LEVOTHROID) 25 MCG tablet Take 25 mcg by mouth daily before breakfast.   Yes [provider]  loperamide (IMODIUM A-D) 2 MG tablet Take 2 mg by mouth as needed for diarrhea or loose stools.   Yes [provider]  Menthol, Topical  Analgesic, (BIOFREEZE) 4 % GEL Apply topically daily as needed (for pain to upper back and bilateral shoulder).   Yes [provider]  UNABLE TO FIND Take 120 mLs by mouth 2 (two) times daily. Med Name: MedPass 2.0   Yes [provider]  vitamin B-12 (CYANOCOBALAMIN) 500 MCG tablet Take 500 mcg by mouth daily.   Yes [provider]  saccharomyces boulardii (FLORASTOR) 250 MG capsule Take 1 capsule (250 mg total) by mouth 2 (two) times daily. Patient not taking: Reported on 09/29/2017 06/12/17   Florencia Reasons, MD  sodium bicarbonate 650 MG tablet Take 1 tablet (650 mg total) by mouth daily at 12 noon. Patient not taking: Reported on 09/29/2017 06/13/17   Florencia Reasons, MD    Scheduled Meds: . Chlorhexidine Gluconate Cloth  6 each Topical Q0600  . levothyroxine  25 mcg Oral QAC breakfast  . mupirocin ointment  1 application Nasal BID  . pantoprazole (PROTONIX) IV  40 mg Intravenous Q12H   Infusions: . sodium chloride 125 mL/hr at 10/02/17 0548  . sodium chloride    . piperacillin-tazobactam (ZOSYN)  IV Stopped (10/02/17 0948)   PRN Meds: MUSCLE RUB, ondansetron (ZOFRAN) IV   Allergies as of 09/29/2017  . (No Known Allergies)    Family History  Problem Relation Age of Onset  . Glaucoma Mother   . Stroke Father   . Prostate cancer Neg Hx     Social History   Socioeconomic History  . Marital status: Single    Spouse name: Not on file  . Number of children: Not on file  . Years of education: Not on file  . Highest education level: Not on file  Occupational History  . Occupation: Retired  Scientific laboratory technician  . Financial resource strain: Not on file  . Food insecurity:    Worry: Not on file    Inability: Not on file  . Transportation needs:    Medical: Not on file    Non-medical: Not on file  Tobacco Use  . Smoking status: Never Smoker  . Smokeless tobacco: Never Used  Substance and Sexual Activity  . Alcohol use: No    Alcohol/week: 0.0 oz  . Drug use: No    . Sexual activity: Not Currently  Lifestyle  . Physical activity:    Days per week: Not on file    Minutes per session: Not on file  . Stress: Not on file  Relationships  . Social connections:    Talks on phone: Not on file    Gets together: Not on file  Attends religious service: Not on file    Active member of club or organization: Not on file    Attends meetings of clubs or organizations: Not on file    Relationship status: Not on file  . Intimate partner violence:    Fear of current or ex partner: Not on file    Emotionally abused: Not on file    Physically abused: Not on file    Forced sexual activity: Not on file  Other Topics Concern  . Not on file  Social History Narrative   Currently lives in [place]. Uses cane.    REVIEW OF SYSTEMS: Constitutional: Generally the patient is ambulatory, participates in activities at the gym at [facility] and is up-to-date on news.  He is quite functional. ENT:  No nose bleeds Pulm: No trouble breathing.  No cough. CV:  No palpitations, no LE edema.  No chest pain. GU:  No hematuria, no frequency GI:  Per HPI Heme:  Per HPI.     Transfusions:  Per HPI Neuro:  No headaches, no peripheral tingling or numbness Derm:  No itching, no rash or sores.  Endocrine:  No sweats or chills.  No polyuria or dysuria Immunization:  Not queried Travel:  None beyond local counties in last few months.    PHYSICAL EXAM: Vital signs in last 24 hours: Vitals:   10/01/17 2104 10/02/17 0507  BP: 99/79 (!) 123/59  Pulse: 88 (!) 105  Resp: 16 16  Temp: 98.1 F (36.7 C) 97.9 F (36.6 C)  SpO2: 98% 100%   Wt Readings from Last 3 Encounters:  09/29/17 136 lb (61.7 kg)  06/12/17 136 lb 14.4 oz (62.1 kg)  03/27/17 137 lb (62.1 kg)    General: Frail, aged, pale WM. Head: No facial asymmetry or swelling.  No signs of head trauma. Eyes: Conjunctiva pale.  No scleral icterus. Ears: Hard of hearing.  No Nose: No discharge or  congestion. Mouth: Oral mucosa dry.  Tongue stained with dark material.  Tongue midline with healing trauma on the left lateral aspect. Neck: No JVD, no masses, no thyromegaly. Lungs: Clear bilaterally.  Slightly dyspneic with speaking. Heart: RRR.  No MRG.  S1, S2 present. Abdomen: Soft.  Minimal tenderness to the left at the mid abdomen.  No guarding or rebound.  Bowel sounds active.  No distention.  No HSM, masses, bruits, succession splash..   Rectal: Deferred Musc/Skeltl: No joint redness or swelling.  Arthritic changes in the hands/fingers. Extremities: No CCE. Neurologic: Alert.  Appropriate.  Follows commands.  Moves all 4 limbs.  No tremor. Skin: No rashes, no sores.  Some purpura on the arms. Tattoos: None observed. Nodes: No cervical adenopathy. Psych: Pleasant, cooperative.    Intake/Output from previous day: 06/16 0701 - 06/17 0700 In: 889 [I.V.:875; IV Piggyback:14] Out: 1400 [Urine:1400] Intake/Output this shift: No intake/output data recorded.  LAB RESULTS: Recent Labs    10/01/17 0526 10/02/17 0712 10/02/17 1042  WBC 12.4* 9.6 7.9  HGB 8.4* 6.4* 6.0*  HCT 28.0* 21.4* 20.3*  PLT 185 207 201   BMET Lab Results  Component Value Date   NA 144 10/02/2017   NA 138 10/01/2017   NA 140 09/30/2017   K 3.8 10/02/2017   K 3.7 10/01/2017   K 4.2 09/30/2017   CL 117 (H) 10/02/2017   CL 112 (H) 10/01/2017   CL 111 09/30/2017   CO2 21 (L) 10/02/2017   CO2 21 (L) 10/01/2017   CO2 22 09/30/2017  GLUCOSE 108 (H) 10/02/2017   GLUCOSE 95 10/01/2017   GLUCOSE 79 09/30/2017   BUN 47 (H) 10/02/2017   BUN 37 (H) 10/01/2017   BUN 40 (H) 09/30/2017   CREATININE 1.13 10/02/2017   CREATININE 1.30 (H) 10/01/2017   CREATININE 1.95 (H) 09/30/2017   CALCIUM 7.5 (L) 10/02/2017   CALCIUM 7.4 (L) 10/01/2017   CALCIUM 7.7 (L) 09/30/2017   LFT No results for input(s): PROT, ALBUMIN, AST, ALT, ALKPHOS, BILITOT, BILIDIR, IBILI in the last 72 hours. PT/INR Lab Results    Component Value Date   INR 1.07 09/29/2017   INR 1.16 06/08/2017   INR 1.15 12/10/2014   RADIOLOGY STUDIES: Dg Abd 1 View  Result Date: 10/01/2017 CLINICAL DATA:  Vomiting EXAM: ABDOMEN - 1 VIEW COMPARISON:  09/29/2017 FINDINGS: Dilated small bowel loops again noted in the mid abdomen, slightly increased since prior study concerning for small bowel obstruction. No free air or organomegaly. IMPRESSION: Increasing gaseous distention of small bowel loops in the abdomen concerning for small bowel obstruction. Electronically Signed   By: Rolm Baptise M.D.   On: 10/01/2017 09:18      IMPRESSION:   *   SBO with brown, occult blood positive emesis and FOBT + watery/dark stools.Marland Kitchen  He is on Protonix 40 mg IV twice daily.  No PPI or H2 blocker as an outpatient  *    Acute on chronic anemia.  Received iron infusions as well as blood transfusion in 2018 and another blood transfusion in February 2019. Currently receiving the first of 2 U PRBCs ordered this afternoon.    *   Acute on chronic anemia.  MCV  *   Proteus bacteremia..  On Zosyn.  Urine culture growing multiple species.  Chronic indwelling Foley catheter.   PLAN:     *   Ought to be seen by general surgery regarding the SBO.  Suggest you contact them for a consult.  *   Dr. Fuller Plan will be seeing the patient later today and decide whether or not to pursue upper endoscopy.  *   Continue the IV Protonix.  *    If he starts vomiting spontaneously, would place an NG tube to low intermittent suction.  At present he is only vomiting if he takes p.o. since he is n.p.o. so did not order NG tube.  *   Since he is has hypothyroidism, it is worth checking TSH to make sure that his hypothyroidism is well controlled. CBC in the morning.   Azucena Freed  10/02/2017, 12:58 PM Phone [phone]     Attending physician's note   I have taken a history, examined the patient and reviewed the chart. I agree with the Advanced Practitioner's  note, impression and recommendations.  SB dilation on KUB with dark emesis, heme + stool and acute on chronic anemia.  SBO vs ileus.  R/O mass, ulcer, MW tear.   NPO  Abd/pelvic CT today as discussed with surgical consult  NGT to LIS if vomiting persists or bowel distension worsens IV PPI  Trend CBC  Consider EGD when SB distension improves      Lucio Edward, MD [facility] [phone] office

## 2017-10-02 NOTE — Progress Notes (Signed)
Notified Dr. Sherlene Shams that patient is wheezing. O2 sats 99% on 2L. Will continue to monitor patient.

## 2017-10-02 NOTE — Progress Notes (Addendum)
Subjective:  Anthony Nicholson was seen lying comfortably in bed this morning. His daughter is at the bedside. They report one episode of dark emesis last night and he says his mouth feels dry. He reports at least one bowel movement overnight, without melena or hematochezia. He says his abdominal pain has resolved. He denies cough, dyspnea, orthopnea, chest pain, or dysuria.  Objective:  Vital signs in last 24 hours: Vitals:   10/01/17 0536 10/01/17 1300 10/01/17 2104 10/02/17 0507  BP: 117/72 120/70 99/79 (!) 123/59  Pulse: 89 88 88 (!) 105  Resp: 18 17 16 16   Temp: 98.6 F (37 C) 98.4 F (36.9 C) 98.1 F (36.7 C) 97.9 F (36.6 C)  TempSrc: Oral Oral Oral Oral  SpO2: 100% 98% 98% 100%  Weight:      Height:       Weight change:   Intake/Output Summary (Last 24 hours) at 10/02/2017 0655 Last data filed at 10/02/2017 4854 Gross per 24 hour  Intake 888.96 ml  Output 1400 ml  Net -511.04 ml   GENERAL-Frail but pleasant elderly caucasian man in no acute distress; conversant HEENT-Dry oral mucosa with dried blood on tongue and soft palate, bilateral hearing aids in place, no cervical lymphadenopathy. CARDIAC-Normal rate, regular rhythm, no murmur. Distal pulses 2+ bilaterally. No LE edema. No JVD. Capillary refill <3 sec in distal phalanges. RESP-Breathing comfortably on nasal cannula. Symmetric chest wall expansion. No crackles, or rhonchi. Occasional cough, mild expiratory wheezes. ABDOMEN- Mildly distended abdomen, hypoactive bowel sounds. Mild tenderness to palpation lower quadrants> upper quadrants. NEURO- A&Ox3. PERRLA. EOM's intact. Moves all extremities appropriately. SKIN-Diffuse actinic keratoses, bruising, no rashes   Assessment/Plan:  Active Problems: Sepsis with Proteus bacteremia Ileus vs. SBO Acute Anemia AKI on CKD stage 3 Prostate adenocarcinoma Dysphagia  84 year old male with PMH of urinary obstruction from prostate cancer with chronic indwelling Foley  catheter, CKD stage III, hypothyroidism, and baseline dysphagia who presents with suprapubic tenderness, hemoptysis, fever, AMS, leukocytosis, and elevated lactic acid concerning for urosepsis and questionable aspiration pneumonia. Blood cultures positive for Proteus species.  Sepsis, Proteus bacteremia- Proteus bacteremia from indwelling foley with frequent instrumentation. Remains afebrile. Blood culture is pansensitive. He is still requiring supplemental O2 and will work on weaning this as able today.There was concern for possible aspiration PNA on initial presentation, so taking this into account with antibiotic regimen. -Consider narrowing antibiotics to IV Unasyn today, and eventually Augmentin when tolerating PO -IV ns @125mls /hr - will continue at current rate for now given N/V -Delirium precautions -Resume Vancomycin should status deteriorate given his +MRSA screen and ?asp risk  Ileus vs. SBO- Patient has had multiple episodes of dark emesis. Difficult to pinpoint exactly when this started as he had emesis prior to admission. Abdomen is mildly distended with hypoactive sounds on exam however he continues to have liquid BMs. Abdominal x-ray yesterday showed dilated small bowel loops increased from previous study. Suspect intestinal ileus given acute illness, hypoactive sounds on exam, and no hx of prior abdominal surgery. -NPO/Bowel rest  -If continued emesis or worsening distension, consider NG tube for decompression+/- CT abdomen for further evaluation  Acute on Chronic Anemia- Hb 9.1 --> 8.4-->6.4 this morning. Platelets wnl. Possible dried blood visible on tongue and soft palate this morning, tested positive for occult blood. BUN also high at 47 with normal Cr. Has known chronic anemia and received IV iron in the past. Repeating CBC to ensure not lab error; if remains <7 will transfuse and consult  GI. Could be stress ulcer.   -Obtain LDH, haptoglobin to r/o hemolytic anemia -Repeat  CBC  AKI on CKD stage 3- Resolved. Back to baseline. Cr 1.95 -> 1.3-->1.13 this morning. Lytes grossly stable and he is having good UOP from Coude catheter. Expect continued improvement with IVF and holding nephrotoxic meds as able.   Prostate adenocarcinoma- Diagnosed with prostate adenocarcinoma, Gleason 7, back in 2016 and underwent androgen deprivation therapy at the time, no surgery or radiation. Has a chronic indwelling Foley catheter for urinary retention and replaced with a Coude catheter 6/14. No hematuria or dysuria this morning and having good urine output. Will continue to follow I/Os closely.   Dysphagia- Swallow evaluation recommended continued dysphagia 2 diet with thin liquids, which he was on at ALF.  C. Diff Ag positive, Toxigenic C. Diff Ag positive, Negative C. Diff Toxin: Studies obtained suggest patient is colonized with toxigenic C diff strain but TOXIN was negative - indicating he is likely not producing the actual C. Diff toxin currently. No excessive diarrhea and his leukocytosis is improving.   FEN: NPO, NS @125cc /hr, replete lytes prn VTE ppx: Lovenox  Code Status: DNR  Dispo: Anticipate>2 midnight stay for resolution of ileus and correction of acute anemia.   LOS: 3 days   Thornton Papas, Medical Student 10/02/2017, 6:55 AM Pager: [phone]  Attestation for Student Documentation:  I personally was present and performed or re-performed the history, physical exam and medical decision-making activities of this service and have verified that the service and findings are accurately documented in the student's note.  AM Labs with acute drop in Hb from 9.1 > 8.4 > 6.4. No obvious source of bleeding although he has been having intermittent small volume brown emesis and rising BUN. Emesis positive for occult blood although he denies any abdominal pain today and reportedly hasnt had any emesis this morning. Stat repeat CBC to ensure lab error; if still <7 will  transfuse and consult GI. Until then I've started him on IV BID PPI. Received Lovenox early this AM for DVT ppx; holding for now and have ordered SCDs. Attempted 2x to contact daughter [name] regarding possible transfusion but unable to reach.  Tonye Tancredi, DO 10/02/2017, 11:44 AM

## 2017-10-02 NOTE — Progress Notes (Signed)
I was called to the room for increased work of breathing. When I entered the room, it is noticeable that he has increased work of breathing despite speaking in full sentences there and there is audibly wheezing. He says that he is feeling fine, like his normal self except for a sore throat and is free of chest pain or difficulty breathing. He is not oriented. His daughter is in the room and says that she noticed his breathing began sounding different this afternoon.   He is currently receiving IV NS @150  cc/hr and a unit of PRBCs On exam he has normal cardiac rate and rhythm, there is a systolic murmur loudest over the right upper sternal border. There is no peripheral edema.  There are decreased lung sounds over the right lung field and wheezing appreciated over the left lung fields.   Stat CXR reveals increasing size of the right pleural effusion and interstitial edema. There is still concern for the right lung opacity.   - he was initially hypotensive on admission but this has significantly improved, will discontinue IV fluids for now while he is received blood transfusions  - continue plan for blood transfusions and follow up H&H  - tessalon perls for sore throat - trial of duonebs treatment  - will continue to monitor his respiratory status closely with continuous pulse ox, he may require escalated care or IV lasix if his symptoms become worse overnight.

## 2017-10-02 NOTE — NC FL2 (Addendum)
[place] LEVEL OF CARE SCREENING TOOL     IDENTIFICATION  Patient Name: Anthony Nicholson Birthdate: 1917/11/02 Sex: male Admission Date (Current Location): 09/29/2017  [facility] and [place] Number:  Herbalist and Address:  [facility]. [facility], [place] [address], [place], [place] [address]      Provider Number: [id number]  Attending Physician Name and Address:  Annia Belt, MD  Relative Name and Phone Number:  Burnell Blanks    Current Level of Care: Hospital Recommended Level of Care: [place] Prior Approval Number:    Date Approved/Denied:   PASRR Number: [id number]  Discharge Plan: SNF    Current Diagnoses: Patient Active Problem List   Diagnosis Date Noted  . Prostate cancer ([place])   . Proteus septicemia ([place])   . Community acquired pneumonia of right lower lobe of lung ([place]) 09/29/2017  . Pressure injury of skin 06/07/2017  . Acute encephalopathy 06/06/2017  . CKD (chronic kidney disease) stage 3, GFR 30-59 ml/min (H[facility]) 06/06/2017  . Demand ischemia ([place])   . Sepsis ([place]) 12/09/2014  . Lower urinary tract infectious disease 12/09/2014  . Adenocarcinoma of prostate ([place]) 10/03/2014  . Urinary retention   . Anemia of chronic disease 09/29/2014  . Pain in lower limb 08/09/2013  . Onychomycosis 11/09/2012  . Pain in joint, ankle and foot 11/09/2012    Orientation RESPIRATION BLADDER Height & Weight     Self  O2(2L nasal canula) Incontinent, Indwelling catheter Weight: 136 lb (61.7 kg) Height:  5\' 6"  (167.6 cm)  BEHAVIORAL SYMPTOMS/MOOD NEUROLOGICAL BOWEL NUTRITION STATUS      Incontinent Diet(see discharge summary)  AMBULATORY STATUS COMMUNICATION OF NEEDS Skin   Extensive Assist Verbally Other (Comment), Normal(MASD on buttocks and sacrum)                       Personal Care Assistance Level of Assistance  Bathing, Feeding, Dressing Bathing Assistance: Maximum assistance Feeding  assistance: Limited assistance Dressing Assistance: Maximum assistance     Functional Limitations Info  Sight, Hearing, Speech Sight Info: Adequate Hearing Info: Adequate Speech Info: Adequate    SPECIAL CARE FACTORS FREQUENCY  PT (By licensed PT), OT (By licensed OT)     PT Frequency: 5x week OT Frequency: 5x week            Contractures Contractures Info: Not present    Additional Factors Info  Code Status, Allergies  Contact Precautions Code Status Info: DNR Allergies Info: No Known Allergies  Contact Precautions: MRSA          Current Medications (10/02/2017):  This is the current hospital active medication list Current Facility-Administered Medications  Medication Dose Route Frequency Provider Last Rate Last Dose  . 0.9 %  sodium chloride infusion   Intravenous Continuous Molt, Bethany, DO 125 mL/hr at 10/02/17 0548    . Chlorhexidine Gluconate Cloth 2 % PADS 6 each  6 each Topical Q0600 Annia Belt, MD   6 each at 10/02/17 [phone]  . enoxaparin (LOVENOX) injection 30 mg  30 mg Subcutaneous Daily Annia Belt, MD   30 mg at 10/02/17 6301  . levothyroxine (SYNTHROID, LEVOTHROID) tablet 25 mcg  25 mcg Oral QAC breakfast Collier Salina, MD   25 mcg at 10/02/17 6010  . mupirocin ointment (BACTROBAN) 2 % 1 application  1 application Nasal BID Annia Belt, MD   1 application at 93/23/55 [phone]  . MUSCLE RUB CREA  Topical Daily PRN Collier Salina, MD      . ondansetron [facility]) injection 4 mg  4 mg Intravenous Q6H PRN Molt, Bethany, DO   4 mg at 10/01/17 1313  . piperacillin-tazobactam (ZOSYN) IVPB 3.375 g  3.375 g Intravenous Q8H Dang, Thuy D, RPH 12.5 mL/hr at 10/02/17 0548 3.375 g at 10/02/17 0548     Discharge Medications: Please see discharge summary for a list of discharge medications.  Relevant Imaging Results:  Relevant Lab Results:   Additional Information SS# [place], [place]

## 2017-10-02 NOTE — Consult Note (Signed)
Anthony Nicholson 08/26/1917  [id number].    Requesting MD: Dr. Orie Fisherman Chief Complaint/Reason for Consult: GI bleed/obstruction  HPI:  This is a 84 yo white male who is at baseline not confused and in assisted living at [facility].  His history is obtained from his daughter as he is very confused and unable to provide a history.  He has a history of prostate cancer and received some type of therapy, but was not considered a surgical candidate 5 years ago for resection.  He is no longer receiving treatment for this.  Because of the prostate issue, he also has a chronic indwelling foley catheter and recurrent UTIs.  The patient was brought to the ED on Friday secondary to AMS and found to have proteus bacteremia secondary to a UTI.  He was also found to have a PNA as well.  He is on zosyn for these findings.  In the interim, he has developed some nausea and emesis that is dark per the daughter.  Unclear if this has blood in it or not.  He has also had some diarrhea that is positive for blood.  It has also been found to be indeterminate for C diff.  Because of his emesis, he has had 2 KUBs done which reveals some small bowel dilatation concerning for possible obstruction.  GI has evaluated the patient and now we have been asked to see the patient as well.  ROS: ROS: unable to obtain as the patient is confused.  Family History  Problem Relation Age of Onset  . Glaucoma Mother   . Stroke Father   . Prostate cancer Neg Hx     Past Medical History:  Diagnosis Date  . Basal cell carcinoma   . Chronic indwelling Foley catheter   . HOH (hard of hearing)   . Indwelling urinary catheter present   . Laceration of arm 02/04/2013  . Prostate cancer ([place])   . Recurrent UTI     Past Surgical History:  Procedure Laterality Date  .  sharpnel removal    1943    . CYSTOSCOPY N/A 10/02/2014   Procedure: FLEXIBLE CYSTOSCOPY;  Surgeon: Irine Seal, MD;  Location: [facility];  Service: Urology;   Laterality: N/A;  . I&D EXTREMITY Left 02/04/2013   Procedure: IRRIGATION AND DEBRIDEMENT Left Arm Laceration with Repair as Necessary;  Surgeon: Wylene Simmer, MD;  Location: [facility];  Service: Orthopedics;  Laterality: Left;  . INCISION AND DRAINAGE Left 02/04/2013   left arm laceration    Dr Doran Durand  . PROSTATE BIOPSY N/A 10/02/2014   Procedure: PROSTATE ULTRASOUND AND BIOPSY ;  Surgeon: Irine Seal, MD;  Location: [facility];  Service: Urology;  Laterality: N/A;    Social History:  reports that he has never smoked. He has never used smokeless tobacco. He reports that he does not drink alcohol or use drugs.  Allergies: No Known Allergies  Medications Prior to Admission  Medication Sig Dispense Refill  . acetaminophen (TYLENOL) 325 MG tablet Take 650 mg by mouth every 6 (six) hours as needed for mild pain.    . carboxymethylcellulose (REFRESH PLUS) 0.5 % SOLN Place 1 drop into both eyes 3 (three) times daily as needed (dry eye).    . cholecalciferol (VITAMIN D) 1000 units tablet Take 2,000 Units by mouth daily.    . folic acid (FOLVITE) 1 MG tablet Take 1 mg by mouth daily.    Marland Kitchen levothyroxine (SYNTHROID, LEVOTHROID) 25 MCG tablet Take 25 mcg  by mouth daily before breakfast.    . loperamide (IMODIUM A-D) 2 MG tablet Take 2 mg by mouth as needed for diarrhea or loose stools.    . Menthol, Topical Analgesic, (BIOFREEZE) 4 % GEL Apply topically daily as needed (for pain to upper back and bilateral shoulder).    Marland Kitchen UNABLE TO FIND Take 120 mLs by mouth 2 (two) times daily. Med Name: MedPass 2.0    . vitamin B-12 (CYANOCOBALAMIN) 500 MCG tablet Take 500 mcg by mouth daily.    Marland Kitchen saccharomyces boulardii (FLORASTOR) 250 MG capsule Take 1 capsule (250 mg total) by mouth 2 (two) times daily. (Patient not taking: Reported on 09/29/2017) 30 capsule 0  . sodium bicarbonate 650 MG tablet Take 1 tablet (650 mg total) by mouth daily at 12 noon. (Patient not taking: Reported on 09/29/2017) 15 tablet 0     Physical  Exam: Blood pressure (!) 117/57, pulse 98, temperature 98.1 F (36.7 C), temperature source Axillary, resp. rate 16, height _0  (1.676 m), weight 61.7 kg (136 lb), SpO2 100 %. General: pleasantly confused, WD, WN white male who is laying in bed in NAD HEENT: head is normocephalic, atraumatic.  Sclera are noninjected.  PERRL.  Ears and nose without any masses or lesions.  Mouth is pink and dry Heart: regular, rate, and rhythm.  Normal s1,s2. No obvious murmurs, gallops, or rubs noted.  Palpable radial and pedal pulses bilaterally Lungs: CTAB, no wheezes, rhonchi, or rales noted.  Respiratory effort nonlabored Abd: soft, mild diffuse tenderness, some bloating, absent BS, no masses, hernias, or organomegaly MS: all 4 extremities are symmetrical with no cyanosis, clubbing, or edema. Skin: warm and dry with no masses, lesions, or rashes Psych: A&Ox1, to self only.  He does not know where he is or the date.  He is oriented to past situation as he can tell me his whole story of his experience in [place].   Results for orders placed or performed during the hospital encounter of 09/29/17 (from the past 48 hour(s))  CBC Once     Status: Abnormal   Collection Time: 10/01/17  5:26 AM  Result Value Ref Range   WBC 12.4 (H) 4.0 - 10.5 K/uL   RBC 2.92 (L) 4.22 - 5.81 MIL/uL   Hemoglobin 8.4 (L) 13.0 - 17.0 g/dL   HCT 28.0 (L) 39.0 - 52.0 %   MCV 95.9 78.0 - 100.0 fL   MCH 28.8 26.0 - 34.0 pg   MCHC 30.0 30.0 - 36.0 g/dL   RDW 14.0 11.5 - 15.5 %   Platelets 185 150 - 400 K/uL    Comment: Performed at [facility], [place] [address] [place], [place] [address]  Basic metabolic panel Once     Status: Abnormal   Collection Time: 10/01/17  5:26 AM  Result Value Ref Range   Sodium 138 135 - 145 mmol/L   Potassium 3.7 3.5 - 5.1 mmol/L   Chloride 112 (H) 101 - 111 mmol/L   CO2 21 (L) 22 - 32 mmol/L   Glucose, Bld 95 65 - 99 mg/dL   BUN 37 (H) 6 - 20 mg/dL   Creatinine, Ser 1.30 (H) 0.61 - 1.24 mg/dL     Calcium 7.4 (L) 8.9 - 10.3 mg/dL   GFR calc non Af Amer 44 (L) >60 mL/min   GFR calc Af Amer 51 (L) >60 mL/min    Comment: (NOTE) The eGFR has been calculated using the CKD EPI equation. This calculation has not  been validated in all clinical situations. eGFR's persistently <60 mL/min signify possible Chronic Kidney Disease.    Anion gap 5 5 - 15    Comment: Performed at [facility] [address] [place], [place] [address]  CBC Once     Status: Abnormal   Collection Time: 10/02/17  7:12 AM  Result Value Ref Range   WBC 9.6 4.0 - 10.5 K/uL   RBC 2.18 (L) 4.22 - 5.81 MIL/uL   Hemoglobin 6.4 (LL) 13.0 - 17.0 g/dL    Comment: REPEATED TO VERIFY CRITICAL RESULT CALLED TO, READ BACK BY AND VERIFIED WITH: Marjory Sneddon RN @ [phone] ON 10/02/17 BY HTEMOCHE    HCT 21.4 (L) 39.0 - 52.0 %   MCV 98.2 78.0 - 100.0 fL   MCH 29.4 26.0 - 34.0 pg   MCHC 29.9 (L) 30.0 - 36.0 g/dL   RDW 14.2 11.5 - 15.5 %   Platelets 207 150 - 400 K/uL    Comment: Performed at [facility], [place] [address], [place], [place] [address]  Basic metabolic panel Once     Status: Abnormal   Collection Time: 10/02/17  7:12 AM  Result Value Ref Range   Sodium 144 135 - 145 mmol/L   Potassium 3.8 3.5 - 5.1 mmol/L   Chloride 117 (H) 101 - 111 mmol/L   CO2 21 (L) 22 - 32 mmol/L   Glucose, Bld 108 (H) 65 - 99 mg/dL   BUN 47 (H) 6 - 20 mg/dL   Creatinine, Ser 1.13 0.61 - 1.24 mg/dL   Calcium 7.5 (L) 8.9 - 10.3 mg/dL   GFR calc non Af Amer 52 (L) >60 mL/min   GFR calc Af Amer >60 >60 mL/min    Comment: (NOTE) The eGFR has been calculated using the CKD EPI equation. This calculation has not been validated in all clinical situations. eGFR's persistently <60 mL/min signify possible Chronic Kidney Disease.    Anion gap 6 5 - 15    Comment: Performed at [facility] [address], [place], [place] [address]  Occult blood card to lab, stool     Status: Abnormal   Collection Time: 10/02/17  9:39 AM  Result  Value Ref Range   Fecal Occult Bld POSITIVE (A) NEGATIVE    Comment: Performed at [facility] [address] [place], [place] [address]  Lactate dehydrogenase     Status: None   Collection Time: 10/02/17 10:19 AM  Result Value Ref Range   LDH 109 98 - 192 U/L    Comment: Performed at [facility], [place] [address], [place], [place] [address]  CBC with Differential/Platelet     Status: Abnormal   Collection Time: 10/02/17 10:42 AM  Result Value Ref Range   WBC 7.9 4.0 - 10.5 K/uL   RBC 2.08 (L) 4.22 - 5.81 MIL/uL   Hemoglobin 6.0 (LL) 13.0 - 17.0 g/dL    Comment: REPEATED TO VERIFY CRITICAL RESULT CALLED TO, READ BACK BY AND VERIFIED WITH: D LEWIS,RN [phone] WILDERK    HCT 20.3 (L) 39.0 - 52.0 %   MCV 97.6 78.0 - 100.0 fL   MCH 28.8 26.0 - 34.0 pg   MCHC 29.6 (L) 30.0 - 36.0 g/dL   RDW 14.2 11.5 - 15.5 %   Platelets 201 150 - 400 K/uL   Neutrophils Relative % 85 %   Neutro Abs 6.7 1.7 - 7.7 K/uL   Lymphocytes Relative 6 %  Lymphs Abs 0.5 (L) 0.7 - 4.0 K/uL   Monocytes Relative 5 %   Monocytes Absolute 0.4 0.1 - 1.0 K/uL   Eosinophils Relative 3 %   Eosinophils Absolute 0.2 0.0 - 0.7 K/uL   Basophils Relative 0 %   Basophils Absolute 0.0 0.0 - 0.1 K/uL   Immature Granulocytes 1 %   Abs Immature Granulocytes 0.0 0.0 - 0.1 K/uL    Comment: Performed at [facility], [place] [address], [place], [place] [address]  Type and screen [place]     Status: None (Preliminary result)   Collection Time: 10/02/17 12:19 PM  Result Value Ref Range   ABO/RH(D) A POS    Antibody Screen NEG    Sample Expiration 10/05/2017    Unit Number [id number]    Blood Component Type RED CELLS,LR    Unit division 00    Status of Unit ISSUED    Transfusion Status OK TO TRANSFUSE    Crossmatch Result      Compatible Performed at [facility], [place] [address] [place], [place] [address]    Unit Number [id number]    Blood Component Type RED CELLS,LR     Unit division 00    Status of Unit ALLOCATED    Transfusion Status OK TO TRANSFUSE    Crossmatch Result Compatible   ABO/Rh     Status: None   Collection Time: 10/02/17 12:19 PM  Result Value Ref Range   ABO/RH(D)      A POS Performed at [facility], [place] [address], [place], [place] [address]   Prepare RBC     Status: None   Collection Time: 10/02/17 12:19 PM  Result Value Ref Range   Order Confirmation      ORDER PROCESSED BY BLOOD BANK Performed at [facility], [place] [address] [place], [place] [address]    Dg Abd 1 View  Result Date: 10/01/2017 CLINICAL DATA:  Vomiting EXAM: ABDOMEN - 1 VIEW COMPARISON:  09/29/2017 FINDINGS: Dilated small bowel loops again noted in the mid abdomen, slightly increased since prior study concerning for small bowel obstruction. No free air or organomegaly. IMPRESSION: Increasing gaseous distention of small bowel loops in the abdomen concerning for small bowel obstruction. Electronically Signed   By: Rolm Baptise M.D.   On: 10/01/2017 09:18      Assessment/Plan Small bowel dilatation The patient has a KUB which suggests small bowel dilatation, possible obstruction.  The patient has never had abdominal surgery so scar tissue is an unlikely etiology for this finding, but still possible.  He does have a history of prostate cancer.  Therefore, a malignant etiology is possible.  On the other hand, he has active infection in his abdomen with a UTI, +/- C diff.  He could just as easily have a reactive ileus.  To further delineate an etiology, I will order a CT scan without IV contrast given his elevation in creatinine since admission and he is 61 with likely little reserve.  Hopefully this will help determine a more clear etiology.  It is difficult to tell currently just how much emesis he has had.  If he continues to vomit, he may require an NGT.  ABL anemia The patient is + FOBT and has dropped his hgb from 8 to 6 in the last day.  He has been  here since Friday and this is unlikely to still be a dilutional drop.  Unclear source of bleeding, upper  or lower GI tract.  If his CT scan shows a mass as a source for his bowel dilatation then it is likely his bleeding could be coming from this etiology.  However, if his scan does not show such a finding, then the patient may have multiple problems and his bleeding could be coming from something else which may require evaluation via scope.  Once both of these problems can be sorted out, I think it will help the daughter and family to decide if or what type of treatment they wish for the patient.  They are aware of his age and are already hesitant to think he can tolerate something big.  I did not go into this much during my visit as I don't have enough information at this time to determine what he may or may not need.  Proteus bacteremia secondary to UTI PNA ? c diff colitis H/o prostate cancer Recurrent UTI Chronic indwelling foley catheter   Henreitta Cea, [facility] 10/02/2017, 3:54 PM Pager: [phone]

## 2017-10-02 NOTE — Progress Notes (Signed)
Dr. Frederico Hamman notified of HBG 6.4. Will continue to monitor patient.

## 2017-10-02 NOTE — Progress Notes (Signed)
SLP Cancellation Note  Patient Details Name: Anthony Nicholson MRN: [id number] DOB: [birth year]   Cancelled treatment:       Reason Eval/Treat Not Completed: Medical issues which prohibited therapy. Pt NPO for w/u for possible ileus. Will follow for needs.    Colbe Viviano, Katherene Ponto 10/02/2017, 2:21 PM

## 2017-10-02 NOTE — Care Management Note (Signed)
Case Management Note  Patient Details  Name: Anthony Nicholson MRN: [id number] Date of Birth: 09-03-1917  Subjective/Objective:                    Action/Plan:  From [facility] plan to return.  PCP is Knox Saliva NP at [facility] in [place] Workers are [employer] [phone] or Lemmie Evens [phone] Expected Discharge Date:                  Expected Discharge Plan:  [place]  In-House Referral:  Clinical Social Work  Discharge planning Services  NA  Post Acute Care Choice:  NA Choice offered to:     DME Arranged:  N/A DME Agency:  NA  HH Arranged:  NA HH Agency:  NA  Status of Service:  In process, will continue to follow  If discussed at Long Length of Stay Meetings, dates discussed:    Additional Comments:  Marilu Favre, RN 10/02/2017, 10:36 AM

## 2017-10-03 ENCOUNTER — Inpatient Hospital Stay (HOSPITAL_COMMUNITY): Payer: Medicare HMO

## 2017-10-03 DIAGNOSIS — K567 Ileus, unspecified: Secondary | ICD-10-CM

## 2017-10-03 DIAGNOSIS — Z7189 Other specified counseling: Secondary | ICD-10-CM

## 2017-10-03 DIAGNOSIS — Z515 Encounter for palliative care: Secondary | ICD-10-CM

## 2017-10-03 DIAGNOSIS — K56609 Unspecified intestinal obstruction, unspecified as to partial versus complete obstruction: Secondary | ICD-10-CM

## 2017-10-03 DIAGNOSIS — R935 Abnormal findings on diagnostic imaging of other abdominal regions, including retroperitoneum: Secondary | ICD-10-CM

## 2017-10-03 DIAGNOSIS — K920 Hematemesis: Secondary | ICD-10-CM

## 2017-10-03 DIAGNOSIS — A419 Sepsis, unspecified organism: Secondary | ICD-10-CM

## 2017-10-03 LAB — TYPE AND SCREEN
ABO/RH(D): A POS
Antibody Screen: NEGATIVE
UNIT DIVISION: 0
Unit division: 0

## 2017-10-03 LAB — BPAM RBC
BLOOD PRODUCT EXPIRATION DATE: 201907042359
Blood Product Expiration Date: 201907042359
ISSUE DATE / TIME: 201906171708
ISSUE DATE / TIME: 201906171344
UNIT TYPE AND RH: 6200
UNIT TYPE AND RH: 6200

## 2017-10-03 LAB — COMPREHENSIVE METABOLIC PANEL
ALBUMIN: 2.2 g/dL — AB (ref 3.5–5.0)
ALT: 9 U/L — ABNORMAL LOW (ref 17–63)
ANION GAP: 10 (ref 5–15)
AST: 16 U/L (ref 15–41)
Alkaline Phosphatase: 56 U/L (ref 38–126)
BUN: 37 mg/dL — ABNORMAL HIGH (ref 6–20)
CHLORIDE: 119 mmol/L — AB (ref 101–111)
CO2: 17 mmol/L — ABNORMAL LOW (ref 22–32)
Calcium: 7.9 mg/dL — ABNORMAL LOW (ref 8.9–10.3)
Creatinine, Ser: 1.21 mg/dL (ref 0.61–1.24)
GFR calc Af Amer: 56 mL/min — ABNORMAL LOW (ref >60)
GFR calc non Af Amer: 48 mL/min — ABNORMAL LOW (ref >60)
GLUCOSE: 79 mg/dL (ref 65–99)
POTASSIUM: 4 mmol/L (ref 3.5–5.1)
Sodium: 146 mmol/L — ABNORMAL HIGH (ref 135–145)
Total Bilirubin: 0.6 mg/dL (ref 0.3–1.2)
Total Protein: 4.8 g/dL — ABNORMAL LOW (ref 6.5–8.1)

## 2017-10-03 LAB — CBC
HCT: 29.8 % — ABNORMAL LOW (ref 39.0–52.0)
Hemoglobin: 9.1 g/dL — ABNORMAL LOW (ref 13.0–17.0)
MCH: 27.9 pg (ref 26.0–34.0)
MCHC: 30.5 g/dL (ref 30.0–36.0)
MCV: 91.4 fL (ref 78.0–100.0)
Platelets: 204 10*3/uL (ref 150–400)
RBC: 3.26 MIL/uL — ABNORMAL LOW (ref 4.22–5.81)
RDW: 17.2 % — AB (ref 11.5–15.5)
WBC: 9.5 10*3/uL (ref 4.0–10.5)

## 2017-10-03 LAB — TSH: TSH: 10.388 u[IU]/mL — ABNORMAL HIGH (ref 0.350–4.500)

## 2017-10-03 LAB — HAPTOGLOBIN: Haptoglobin: 237 mg/dL — ABNORMAL HIGH (ref 34–200)

## 2017-10-03 MED ORDER — VANCOMYCIN HCL IN DEXTROSE 1-5 GM/200ML-% IV SOLN
1000.0000 mg | INTRAVENOUS | Status: DC
  Administered 2017-10-03: 1000 mg via INTRAVENOUS
  Filled 2017-10-03 (×2): qty 200

## 2017-10-03 MED ORDER — FUROSEMIDE 10 MG/ML IJ SOLN
20.0000 mg | Freq: Once | INTRAMUSCULAR | Status: AC
  Administered 2017-10-03: 20 mg via INTRAVENOUS
  Filled 2017-10-03: qty 2

## 2017-10-03 MED ORDER — IPRATROPIUM-ALBUTEROL 0.5-2.5 (3) MG/3ML IN SOLN
3.0000 mL | Freq: Two times a day (BID) | RESPIRATORY_TRACT | Status: DC
  Administered 2017-10-04 – 2017-10-08 (×9): 3 mL via RESPIRATORY_TRACT
  Filled 2017-10-03 (×10): qty 3

## 2017-10-03 MED ORDER — POTASSIUM CHLORIDE 10 MEQ/100ML IV SOLN
INTRAVENOUS | Status: AC
  Administered 2017-10-03: 10 meq
  Filled 2017-10-03: qty 100

## 2017-10-03 MED ORDER — SODIUM CHLORIDE 0.9 % IV SOLN
INTRAVENOUS | Status: DC
  Administered 2017-10-03 – 2017-10-05 (×2): via INTRAVENOUS

## 2017-10-03 MED ORDER — IPRATROPIUM-ALBUTEROL 0.5-2.5 (3) MG/3ML IN SOLN: 3.0000 mL | Freq: Four times a day (QID) | RESPIRATORY_TRACT | Status: DC

## 2017-10-03 MED ORDER — IPRATROPIUM-ALBUTEROL 0.5-2.5 (3) MG/3ML IN SOLN
3.0000 mL | Freq: Three times a day (TID) | RESPIRATORY_TRACT | Status: DC
  Administered 2017-10-03 (×2): 3 mL via RESPIRATORY_TRACT
  Filled 2017-10-03 (×2): qty 3

## 2017-10-03 NOTE — Progress Notes (Signed)
Physical Therapy Treatment Patient Details Name: Anthony Nicholson MRN: [id number] DOB: 1917-08-18 Today's Date: 10/03/2017    History of Present Illness Pt is a 84 y/o [employer] veteran with a PMH significant for urinary obstruction from prostate cancer with chronic indwelling foley catheter, CKD stage III, hypothyroidism, and baseline dysphagia who presents with suprapubic tenderness, hemoptysis, fever, AMS, leukocytosis, and elevated lactic acid concerning for urosepsis and questionable aspiration PNA.     PT Comments    Pt ambulated 5' with RW and min A, he was encouraged to be able to walk. Remained on 2L O2 throughout with 2/4 DOE while walking. Took 2-3 mins to recover breathing status after sitting. Pt expresses desire to go back to his apt but at this point, continuing to recommend SNF for further rehab. PT will continue to follow.    Follow Up Recommendations  SNF;Supervision/Assistance - 24 hour     Equipment Recommendations  None recommended by PT    Recommendations for Other Services       Precautions / Restrictions Precautions Precautions: Fall Restrictions Weight Bearing Restrictions: No    Mobility  Bed Mobility Overal bed mobility: Needs Assistance Bed Mobility: Supine to Sit     Supine to sit: Max assist     General bed mobility comments: pt able to reach for opposite rail when cued, needed max A for LE's off bed and trunk elevation into sitting. Needed min A to scoot fwd to EOB. Initial posterior lean in sitting  Transfers Overall transfer level: Needs assistance Equipment used: Rolling walker (2 wheeled) Transfers: Sit to/from Stand Sit to Stand: Min assist         General transfer comment: min A for power up  and fwd translation to standing  Ambulation/Gait Ambulation/Gait assistance: Min assist Gait Distance (Feet): 6 Feet Assistive device: Rolling walker (2 wheeled) Gait Pattern/deviations: Step-through pattern;Decreased stride length Gait  velocity: decreased Gait velocity interpretation: <1.31 ft/sec, indicative of household ambulator General Gait Details: pt with stooped posture, increased wt on RW, increased WOB with ambulation. Kept on 2L O2 throughout   Stairs             Wheelchair Mobility    Modified Rankin (Stroke Patients Only)       Balance Overall balance assessment: Needs assistance Sitting-balance support: Feet supported;Bilateral upper extremity supported Sitting balance-Leahy Scale: Poor Sitting balance - Comments: when doing LE ther ex, pt with posterior LOB multiple times, mod A to correct Postural control: Posterior lean Standing balance support: Bilateral upper extremity supported;During functional activity Standing balance-Leahy Scale: Poor Standing balance comment: reliant on UE support to stand                            Cognition Arousal/Alertness: Awake/alert Behavior During Therapy: WFL for tasks assessed/performed Overall Cognitive Status: No family/caregiver present to determine baseline cognitive functioning                                 General Comments: Sleepy initially however was able to be aroused and pt was able to maintain alrertness once mobility began.       Exercises General Exercises - Lower Extremity Ankle Circles/Pumps: AROM;Both;15 reps;Seated Long Arc Quad: AROM;Both;10 reps;Seated    General Comments General comments (skin integrity, edema, etc.): pt left in chair with posey belt alarm on      Pertinent Vitals/Pain Pain Assessment: Faces Faces  Pain Scale: No hurt    Home Living                      Prior Function            PT Goals (current goals can now be found in the care plan section) Acute Rehab PT Goals Patient Stated Goal: Walk more with therapy PT Goal Formulation: With patient Time For Goal Achievement: 10/07/17 Potential to Achieve Goals: Good Progress towards PT goals: Progressing toward  goals    Frequency    Min 2X/week      PT Plan Current plan remains appropriate    Co-evaluation              AM-PAC PT "6 Clicks" Daily Activity  Outcome Measure  Difficulty turning over in bed (including adjusting bedclothes, sheets and blankets)?: Unable Difficulty moving from lying on back to sitting on the side of the bed? : Unable Difficulty sitting down on and standing up from a chair with arms (e.g., wheelchair, bedside commode, etc,.)?: Unable Help needed moving to and from a bed to chair (including a wheelchair)?: A Little Help needed walking in hospital room?: A Little Help needed climbing 3-5 steps with a railing? : Total 6 Click Score: 10    End of Session Equipment Utilized During Treatment: Gait belt Activity Tolerance: Patient tolerated treatment well Patient left: in chair;with call bell/phone within reach;with chair alarm set(telasys) Nurse Communication: Mobility status PT Visit Diagnosis: Unsteadiness on feet (R26.81);Other abnormalities of gait and mobility (R26.89);Muscle weakness (generalized) (M62.81)     Time: 9390-3009 PT Time Calculation (min) (ACUTE ONLY): 35 min  Charges:  $Gait Training: 8-22 mins $Therapeutic Exercise: 8-22 mins                    G Codes:       Leighton Roach, PT  Acute Rehab Services  [place] 10/03/2017, 11:11 AM

## 2017-10-03 NOTE — Progress Notes (Signed)
Pharmacy Antibiotic Note  Anthony Nicholson is a 84 y.o. male admitted on 09/29/2017 with sepsis, initially tx'd w/ ABX and narrowed to Zosyn, now to broaden again given + MRSA screen and aspiration risk.  Pharmacy has been consulted for vancomycin dosing.  Plan: Vancomycin 1000mg  IV every 24 hours.  Goal trough 15-20 mcg/mL.  Height: 5\' 6"  (167.6 cm) Weight: 136 lb (61.7 kg) IBW/kg (Calculated) : 63.8  Temp (24hrs), Avg:98.1 F (36.7 C), Min:97.7 F (36.5 C), Max:98.2 F (36.8 C)  Recent Labs  Lab 09/29/17 0420 09/29/17 0436 09/29/17 0639 09/29/17 1043 09/30/17 0627 10/01/17 0526 10/02/17 0712 10/02/17 1042 10/03/17 0603  WBC 13.9*  --   --   --  15.4* 12.4* 9.6 7.9 9.5  CREATININE 1.86*  --   --   --  1.95* 1.30* 1.13  --   --   LATICACIDVEN  --  3.30* 2.50* 1.7  --   --   --   --   --     Estimated Creatinine Clearance: 32.6 mL/min (by C-G formula based on SCr of 1.13 mg/dL).    No Known Allergies  Antimicrobials this admission: Vanc 6/14 >> 6/15; 6/18 >>  Unasyn 6/15 >> 6/15 Zosyn 6/14 >> 6/14, 6/15 >>  CTX 6/14 >> 6/15  Microbiology results: 6/14 MRSA PCR - positive 6/14 BCx - Proteus (pan-S) 6/14 UCx - suggest recollection 6/15 C.diff quick scan - Ag positive 6/15 C.diff PCR - positive (little to no toxin production, tx if clinically relevant)  Thank you for allowing pharmacy to be a part of this patient's care.  Wynona Neat, PharmD, BCPS  10/03/2017 7:48 AM

## 2017-10-03 NOTE — Progress Notes (Addendum)
Daily Rounding Note  10/03/2017, 1:33 PM  LOS: 4 days   SUBJECTIVE:   Denies nausea or vomiting.  RN confirms that he has not had any regurgitation, vomiting.  Also has not had any bowel movements.  He is been n.p.o. since middle of the day yesterday.   OBJECTIVE:         Vital signs in last 24 hours:    Temp:  [97.7 F (36.5 C)-98.2 F (36.8 C)] 98.2 F (36.8 C) (06/18 0737) Pulse Rate:  [86-108] 87 (06/18 0608) Resp:  [16-20] 20 (06/17 2023) BP: (110-120)/(57-96) 110/65 (06/18 0608) SpO2:  [94 %-100 %] 95 % (06/18 0943) Last BM Date: 10/02/17 Filed Weights   09/29/17 0412  Weight: 136 lb (61.7 kg)   General: Frail, aged but alert. Heart: RRR.  No MRG. Chest: Diminished breath sounds globally but no labored breathing or cough. Abdomen: Somewhat tense, slightly distended.  Bowel sounds quiet. GU:   Foley catheter in place.  Urine in collection bag is pale yellow and very clear. Extremities: No CCE. Neuro/Psych: Appropriate.  Asking a lot of questions.  Quite talkative.  Oriented to place, self.  Moves all 4 limbs.  Intake/Output from previous day: 06/17 0701 - 06/18 0700 In: 1680 [I.V.:1000; Blood:630; IV Piggyback:50] Out: 1062 [Urine:1650]  Intake/Output this shift: Total I/O In: 250 [IV Piggyback:250] Out: -   Lab Results: Recent Labs    10/02/17 0712 10/02/17 1042 10/02/17 2253 10/03/17 0603  WBC 9.6 7.9  --  9.5  HGB 6.4* 6.0* 9.9* 9.1*  HCT 21.4* 20.3* 32.3* 29.8*  PLT 207 201  --  204   BMET Recent Labs    10/01/17 0526 10/02/17 0712 10/03/17 0604  NA 138 144 146*  K 3.7 3.8 4.0  CL 112* 117* 119*  CO2 21* 21* 17*  GLUCOSE 95 108* 79  BUN 37* 47* 37*  CREATININE 1.30* 1.13 1.21  CALCIUM 7.4* 7.5* 7.9*   LFT Recent Labs    10/03/17 0604  PROT 4.8*  ALBUMIN 2.2*  AST 16  ALT 9*  ALKPHOS 56  BILITOT 0.6    Studies/Results: Ct Abdomen Pelvis Wo Contrast  Result Date:  10/03/2017 CLINICAL DATA:  The patient's kv node small-bowel dilatation possibly related to obstruction. History of prostate cancer. Patient has active infection in the abdomen with urinary tract infection last/minus C difficile. Patient could have reactive ileus. EXAM: CT ABDOMEN AND PELVIS WITHOUT CONTRAST TECHNIQUE: Multidetector CT imaging of the abdomen and pelvis was performed following the standard protocol without IV contrast. COMPARISON:  06/07/2017 FINDINGS: Lower chest: Moderate right-sided pleural effusion with atelectasis. Loculated small left pleural effusion along the medial and posterolateral aspect of the left lung base. Posterior pleural plaque is redemonstrated on the left. Moderate-sized hiatal hernia is noted. Heart is normal in size with coronary arteriosclerosis. No pericardial effusion is seen. Nonspecific pulmonary opacity in the anterior left lower lobe may reflect a small focus of inflammation or atelectasis. Pulmonary neoplasm is believed less likely given lack of similar finding on prior recent comparison study. This measures up to 12 mm. Small subpleural area of a ground-glass opacity in the lingula adjacent to paraseptal emphysema. This may represent area of alveolitis or pneumonitis. Retained contrast in the distal esophagus may reflect mild reflux given the hiatal hernia. Hepatobiliary: Small granuloma in the right hepatic lobe. No biliary dilatation. No definite space-occupying mass given limitations of a noncontrast study. Physiologically distended gallbladder without mural thickening  or calculi. Pancreas: Atrophic pancreas without inflammation. Spleen: No splenomegaly or mass. Adrenals/Urinary Tract: Normal bilateral adrenal glands. No nephrolithiasis, obstructive uropathy or focal renal mass. The bladder is decompressed by Foley catheter and contains dependent calculi. Stomach/Bowel: Decompressed stomach. Normal appearance of the duodenum with normal small bowel rotation  proximally. Gradual contrast and fluid-filled distention of jejunal loops without transition point clearly identified. There is slow tapering of small bowel to normal in the right lower quadrant. Findings more likely represent small bowel ileus or dysmotility. Small bowel loops approach the internal ring of the right inguinal canal without herniation or obstruction. Distal and terminal ileum are decompressed and unremarkable. The appendix is normal. Scattered colonic diverticulosis without acute diverticulitis is noted. Amorphous mid pelvic soft tissue attenuation, series 3/76 measuring 4.4 x 2.8 cm is noted. Suspect decompressed bladder diverticulum. Short-term interval follow-up is recommended to determine whether this opacifies with enteric contrast and thus representing decompressed bowel versus an extraluminal abnormality. Vascular/Lymphatic: Moderate aortoiliac atherosclerosis. No aneurysm. Reproductive: Normal size prostate. Other: Mild soft tissue edema compatible with anasarca. Musculoskeletal: Thoracolumbar spondylosis. No aggressive osseous lesions. IMPRESSION: 1. Contrast and fluid-filled distention of jejunal loops without focal transition point identified. Findings may represent small bowel ileus or dysmotility rather than mechanical bowel obstruction. 2. Colonic diverticulosis without acute diverticulitis. 3. Amorphous 4.4 x 2.8 cm mid pelvic density series 3/76 may represent a decompressed bladder diverticulum as previously seen due to decompression of the bladder by Foley catheter on current study. Extraluminal evolving abscess or inflammatory phlegmon are among other possibilities in addition to decompressed bowel. Repeat imaging is recommended to determine whether the patient's ingested enteric contrast fills this area and thus represents unopacified bowel versus an extraluminal abnormality and potentially contributing to the ileus bowel gas pattern. 4. Decompressed urinary bladder with calculi.  No obstructive uropathy. 5. Moderate right and small left pleural effusions with pleural plaque on the left. 6. Thoracolumbar spondylosis. No osteoblastic disease in this patient with history of prostate cancer. 7. Diffuse mild soft tissue anasarca. Electronically Signed   By: Ashley Royalty M.D.   On: 10/03/2017 01:29   Dg Chest Port 1 View  Result Date: 10/02/2017 CLINICAL DATA:  84 y/o M; wheezing developed wall patient was receiving blood transfusion today. Recently diagnosed pneumonia. EXAM: PORTABLE CHEST 1 VIEW COMPARISON:  09/29/2017 chest radiogra CT showing abscess versus ph. FINDINGS: Stable cardiac silhouette given projection and technique. Aortic atherosclerosis with calcification. Increased moderate right pleural effusion and diffuse hazy opacification of the right lung. Reticular opacities and peripheral septal opacities at the left lung base. No acute osseous abnormality is evident. IMPRESSION: Increased moderate right pleural effusion and interstitial edema. Hazy opacification of the right lung may represent associated atelectasis or pneumonia. Electronically Signed   By: Kristine Garbe M.D.   On: 10/02/2017 19:42   Scheduled Meds: . Chlorhexidine Gluconate Cloth  6 each Topical Q0600  . ipratropium-albuterol  3 mL Nebulization TID  . levothyroxine  25 mcg Oral QAC breakfast  . mouth rinse  15 mL Mouth Rinse BID  . mupirocin ointment  1 application Nasal BID  . pantoprazole (PROTONIX) IV  40 mg Intravenous Q12H   Continuous Infusions: . piperacillin-tazobactam (ZOSYN)  IV Stopped (10/03/17 1005)  . vancomycin Stopped (10/03/17 1005)   PRN Meds:.benzonatate, MUSCLE RUB, ondansetron (ZOFRAN) IV   ASSESMENT:   *   SBO vs ileus with brown, occult blood positive emesis and FOBT + watery/dark stools.Marland Kitchen  He is on Protonix 40 mg IV twice daily.  No PPI or H2 blocker as an outpatient Surgery feels no role, see xray findings as ileus.   CT scan with pelvic density.   Extraluminal evolving abscess or inflammatory phlegmon are among other possibilities in addition to decompressed bowel.   *    Acute on chronic anemia.  Received iron infusions as well as blood transfusion in 2018 and another blood transfusion in February 2019. Currently receiving the first of 2 U PRBCs ordered this afternoon.    *   Acute on chronic anemia.  MCV normal.  S/p 2 U PRBCs.  Hgb 6 >> 9.1.      *   Proteus bacteremia.  On Zosyn, vancomycin..  Urine culture growing multiple species.  Chronic indwelling Foley catheter.  *   Dysphagia.  SLP recommended D2 diet with thin liquids but that was before he was made n.p.o. because of the ileus/bowel obstruction.  *   Hypothyroidism.  Takes Synthroid, currently receiving this IV.  His TSH is elevated at 10.38.    PLAN   *   Repeat KUB today to assess BGP.  Keep NPO for now.    *   Defer adjustments of his Synthroid to the resident teaching service team.     Azucena Freed  10/03/2017, 1:33 PM Phone [phone]     Attending physician's note   I have taken an interval history, reviewed the chart and examined the patient. I agree with the Advanced Practitioner's note, impression and recommendations. No vomiting or bowel movements since we evaluated him yesterday. No evidence of persistent GI bleeding. Hb increased to 9.1 post 2U transfusion. CT report and images reviewed - likely a SB ileus however not definitive to excluded SBO, pelvic abnormality could be bladder abnormality, abscess or decompressed bowel. Repeat CT is needed to clarify.  Since there is no active GI bleeding no plans for EGD until bowel is decompressed and pelvic abnormality is clarified. Repeat KUB today.   Lucio Edward, MD FACG [phone] office

## 2017-10-03 NOTE — Progress Notes (Signed)
Subjective/Chief Complaint:abdominal pain Moans but answers questions     Objective: Vital signs in last 24 hours: Temp:  [97.7 F (36.5 C)-98.2 F (36.8 C)] 98.2 F (36.8 C) (06/18 1660) Pulse Rate:  [86-108] 87 (06/18 0608) Resp:  [16-20] 20 (06/17 2023) BP: (110-120)/(57-96) 110/65 (06/18 0608) SpO2:  [94 %-100 %] 94 % (06/18 0608) Last BM Date: 10/02/17  Intake/Output from previous day: 06/17 0701 - 06/18 0700 In: 1680 [I.V.:1000; Blood:630; IV Piggyback:50] Out: 6301 [Urine:1650] Intake/Output this shift: No intake/output data recorded.  GI: ditended but soft mild TTP throughout   Lab Results:  Recent Labs    10/02/17 1042 10/02/17 2253 10/03/17 0603  WBC 7.9  --  9.5  HGB 6.0* 9.9* 9.1*  HCT 20.3* 32.3* 29.8*  PLT 201  --  204   BMET Recent Labs    10/02/17 0712 10/03/17 0604  NA 144 146*  K 3.8 4.0  CL 117* 119*  CO2 21* 17*  GLUCOSE 108* 79  BUN 47* 37*  CREATININE 1.13 1.21  CALCIUM 7.5* 7.9*   PT/INR No results for input(s): LABPROT, INR in the last 72 hours. ABG No results for input(s): PHART, HCO3 in the last 72 hours.  Invalid input(s): PCO2, PO2  Studies/Results: Ct Abdomen Pelvis Wo Contrast  Result Date: 10/03/2017 CLINICAL DATA:  The patient's kv node small-bowel dilatation possibly related to obstruction. History of prostate cancer. Patient has active infection in the abdomen with urinary tract infection last/minus C difficile. Patient could have reactive ileus. EXAM: CT ABDOMEN AND PELVIS WITHOUT CONTRAST TECHNIQUE: Multidetector CT imaging of the abdomen and pelvis was performed following the standard protocol without IV contrast. COMPARISON:  06/07/2017 FINDINGS: Lower chest: Moderate right-sided pleural effusion with atelectasis. Loculated small left pleural effusion along the medial and posterolateral aspect of the left lung base. Posterior pleural plaque is redemonstrated on the left. Moderate-sized hiatal hernia is noted.  Heart is normal in size with coronary arteriosclerosis. No pericardial effusion is seen. Nonspecific pulmonary opacity in the anterior left lower lobe may reflect a small focus of inflammation or atelectasis. Pulmonary neoplasm is believed less likely given lack of similar finding on prior recent comparison study. This measures up to 12 mm. Small subpleural area of a ground-glass opacity in the lingula adjacent to paraseptal emphysema. This may represent area of alveolitis or pneumonitis. Retained contrast in the distal esophagus may reflect mild reflux given the hiatal hernia. Hepatobiliary: Small granuloma in the right hepatic lobe. No biliary dilatation. No definite space-occupying mass given limitations of a noncontrast study. Physiologically distended gallbladder without mural thickening or calculi. Pancreas: Atrophic pancreas without inflammation. Spleen: No splenomegaly or mass. Adrenals/Urinary Tract: Normal bilateral adrenal glands. No nephrolithiasis, obstructive uropathy or focal renal mass. The bladder is decompressed by Foley catheter and contains dependent calculi. Stomach/Bowel: Decompressed stomach. Normal appearance of the duodenum with normal small bowel rotation proximally. Gradual contrast and fluid-filled distention of jejunal loops without transition point clearly identified. There is slow tapering of small bowel to normal in the right lower quadrant. Findings more likely represent small bowel ileus or dysmotility. Small bowel loops approach the internal ring of the right inguinal canal without herniation or obstruction. Distal and terminal ileum are decompressed and unremarkable. The appendix is normal. Scattered colonic diverticulosis without acute diverticulitis is noted. Amorphous mid pelvic soft tissue attenuation, series 3/76 measuring 4.4 x 2.8 cm is noted. Suspect decompressed bladder diverticulum. Short-term interval follow-up is recommended to determine whether this opacifies with  enteric contrast  and thus representing decompressed bowel versus an extraluminal abnormality. Vascular/Lymphatic: Moderate aortoiliac atherosclerosis. No aneurysm. Reproductive: Normal size prostate. Other: Mild soft tissue edema compatible with anasarca. Musculoskeletal: Thoracolumbar spondylosis. No aggressive osseous lesions. IMPRESSION: 1. Contrast and fluid-filled distention of jejunal loops without focal transition point identified. Findings may represent small bowel ileus or dysmotility rather than mechanical bowel obstruction. 2. Colonic diverticulosis without acute diverticulitis. 3. Amorphous 4.4 x 2.8 cm mid pelvic density series 3/76 may represent a decompressed bladder diverticulum as previously seen due to decompression of the bladder by Foley catheter on current study. Extraluminal evolving abscess or inflammatory phlegmon are among other possibilities in addition to decompressed bowel. Repeat imaging is recommended to determine whether the patient's ingested enteric contrast fills this area and thus represents unopacified bowel versus an extraluminal abnormality and potentially contributing to the ileus bowel gas pattern. 4. Decompressed urinary bladder with calculi. No obstructive uropathy. 5. Moderate right and small left pleural effusions with pleural plaque on the left. 6. Thoracolumbar spondylosis. No osteoblastic disease in this patient with history of prostate cancer. 7. Diffuse mild soft tissue anasarca. Electronically Signed   By: Ashley Royalty M.D.   On: 10/03/2017 01:29   Dg Abd 1 View  Result Date: 10/01/2017 CLINICAL DATA:  Vomiting EXAM: ABDOMEN - 1 VIEW COMPARISON:  09/29/2017 FINDINGS: Dilated small bowel loops again noted in the mid abdomen, slightly increased since prior study concerning for small bowel obstruction. No free air or organomegaly. IMPRESSION: Increasing gaseous distention of small bowel loops in the abdomen concerning for small bowel obstruction. Electronically  Signed   By: Rolm Baptise M.D.   On: 10/01/2017 09:18   Dg Chest Port 1 View  Result Date: 10/02/2017 CLINICAL DATA:  84 y/o M; wheezing developed wall patient was receiving blood transfusion today. Recently diagnosed pneumonia. EXAM: PORTABLE CHEST 1 VIEW COMPARISON:  09/29/2017 chest radiograph. FINDINGS: Stable cardiac silhouette given projection and technique. Aortic atherosclerosis with calcification. Increased moderate right pleural effusion and diffuse hazy opacification of the right lung. Reticular opacities and peripheral septal opacities at the left lung base. No acute osseous abnormality is evident. IMPRESSION: Increased moderate right pleural effusion and interstitial edema. Hazy opacification of the right lung may represent associated atelectasis or pneumonia. Electronically Signed   By: Kristine Garbe M.D.   On: 10/02/2017 19:42    Anti-infectives: Anti-infectives (From admission, onward)   Start     Dose/Rate Route Frequency Ordered Stop   10/03/17 0800  vancomycin (VANCOCIN) IVPB 1000 mg/200 mL premix     1,000 mg 200 mL/hr over 60 Minutes Intravenous Every 24 hours 10/03/17 0754     10/01/17 1400  piperacillin-tazobactam (ZOSYN) IVPB 3.375 g     3.375 g 12.5 mL/hr over 240 Minutes Intravenous Every 8 hours 10/01/17 0816     10/01/17 0600  vancomycin (VANCOCIN) IVPB 1000 mg/200 mL premix  Status:  Discontinued     1,000 mg 200 mL/hr over 60 Minutes Intravenous Every 48 hours 09/29/17 1531 09/29/17 2055   09/30/17 0900  piperacillin-tazobactam (ZOSYN) IVPB 2.25 g  Status:  Discontinued     2.25 g 100 mL/hr over 30 Minutes Intravenous Every 6 hours 09/30/17 0814 10/01/17 0816   09/30/17 0600  vancomycin (VANCOCIN) IVPB 1000 mg/200 mL premix  Status:  Discontinued     1,000 mg 200 mL/hr over 60 Minutes Intravenous Every 48 hours 09/30/17 0547 09/30/17 0749   09/30/17 0600  ampicillin-sulbactam (UNASYN) 1.5 g in sodium chloride 0.9 % 100 mL IVPB  Status:  Discontinued      1.5 g 200 mL/hr over 30 Minutes Intravenous Every 12 hours 09/30/17 0547 09/30/17 0749   09/29/17 2200  cefTRIAXone (ROCEPHIN) 2 g in sodium chloride 0.9 % 100 mL IVPB  Status:  Discontinued     2 g 200 mL/hr over 30 Minutes Intravenous Every 24 hours 09/29/17 2055 09/30/17 0527   09/29/17 1600  piperacillin-tazobactam (ZOSYN) IVPB 2.25 g  Status:  Discontinued     2.25 g 100 mL/hr over 30 Minutes Intravenous Every 6 hours 09/29/17 1531 09/29/17 2055   09/29/17 0445  piperacillin-tazobactam (ZOSYN) IVPB 3.375 g     3.375 g 100 mL/hr over 30 Minutes Intravenous  Once 09/29/17 0436 09/29/17 0517   09/29/17 0445  vancomycin (VANCOCIN) IVPB 1000 mg/200 mL premix     1,000 mg 200 mL/hr over 60 Minutes Intravenous  Once 09/29/17 0436 09/29/17 0551      Assessment/Plan:  GIB  May need endoscopy CT shows ileus  advanced age- may need to consider palliative care consult  No role for surgery at this point   Proteus bacteremia secondary to UTI PNA ? c diff colitis H/o prostate cancer Recurrent UTI Chronic indwelling foley catheter      LOS: 4 days    Marcello Moores A Evanell Redlich 10/03/2017

## 2017-10-03 NOTE — Progress Notes (Addendum)
Subjective:  Anthony Nicholson was seen lying comfortably in bed this morning. Family not present at bedside. He denies any abdominal pain, dysuria, shortness of breath, nausea, vomiting, or diarrhea although history was limited by confusion.  No additional episodes of hematemesis overnight. Last bowel movement was yesterday evening.   Objective:  Vital signs in last 24 hours: Vitals:   10/02/17 1734 10/02/17 2023 10/02/17 2042 10/03/17 0608  BP: 116/68 (!) 117/96  110/65  Pulse: (!) 101 86  87  Resp: 18 20    Temp: 97.7 F (36.5 C) 98.2 F (36.8 C)  98.2 F (36.8 C)  TempSrc: Oral Oral  Oral  SpO2: 100% 94% 95% 94%  Weight:      Height:       Weight change:   Intake/Output Summary (Last 24 hours) at 10/03/2017 9323 Last data filed at 10/03/2017 5573 Gross per 24 hour  Intake 1680 ml  Output 1650 ml  Net 30 ml   GENERAL-Frailbut pleasant elderly caucasianman in no acute distress; conversant HEENT-Dry oral mucosa with dried old blood on tongue; no bright-red blood bilateral hearing aids in place, no cervical lymphadenopathy. CARDIAC-Normal rate, regular rhythm, no murmur. Distal pulses 2+ bilaterally. No LE edema. No JVD. Capillary refill <3 secin distal phalanges. RESP- Breathing comfortably on room air. Symmetric chest wall expansion. Moderate wheezing bilaterally.No crackles or rhonchi.  ABDOMEN-Bowel sounds are normoactive. Mildly distended. Diffuse tenderness to palpation, guarding is present, no rebound. No hepatosplenomegaly. NEURO- Alert, oriented to self. PERRLA. EOM's intact.Moves all extremities appropriately. SKIN-Diffuse actinic keratoses, bruising, no rashes. Skin warm and dry.   Assessment/Plan:  Active Problems: Sepsis with Proteus bacteremia Possible Aspiration Pneumonia Ileus vs. SBO Acute Anemia AKI on CKD stage 3 Prostate adenocarcinoma Dysphagia  84 year old male with PMH of urinary obstruction from prostate cancer with chronic indwelling  Foley catheter, CKD stage III, hypothyroidism, and baseline dysphagia who presents with suprapubic tenderness, hemoptysis, fever, AMS, leukocytosis, and elevated lactic acid concerning for urosepsis and questionable aspiration pneumonia. Blood cultures positive for Proteus species.  Sepsis,Proteus bacteremia Possible Aspiration Pneumonia- Proteus bacteremia from indwelling foley with frequent instrumentation. Remains afebrile. Blood culture is pansensitive. There was concern for possible aspiration PNA on initial presentation, so taking this into account with antibiotic regimen. He had some wheezing and increased work of breathing last night, CXR showed moderate right pleural effusion, interstitial edema, and increased right lung opacity. May represent volume overload as well as ongoing aspiration pneumonia.  -Hold IV fluids given findings on CXR -IV Lasix 20 mg -Continue IV Zosyn, may consider transitioning to IV Unasyn when clinical status begins to improve -Resume Vancomycin given his +MRSA screen, aspiration risk, and worsening CXR -Repeat Blood cultures -PRN Duonebs  -Delirium precautions  Ileus vs. SBO-No additional episodes of emesis or BM's overnight. Bowel sounds are now normoactive but he is still distended with diffuse tenderness to palpation. CT Abd/Pelvis showed a distended jejunum without transition point, concerning for ileus. Also showed a possible bladder diverticulum vs. evolving pelvic abscess, and repeat CT was recommended.  -NPO/Bowel rest  -If continued emesis or worsening distension, consider NG tube for decompression -Goal K+>4.0 -Palliative care consult for goals of care discussion with family -Appreciate GI and Gen Surg recs  Acute on Chronic Anemia- Received 2u of pRBC yesterday after Hgb dropped from 8.4-->6.0 overnight. Hgb this morning 9.1 and no additional episodes of hematemesis. Platelets and LDH wnl. Has known chronic anemia and received IV iron in the past.  Acute anemia likely  2/2 to GI bleed given hematemesis and may represent stress ulcer, mallory weiss tear, or malignancy. Continue to monitor closely. -IV Protonix 40 mg BID -CBC in AM -Consider EGD when abdominal distension improves -Appreciate GI recs  AKI on CKD stage 3-Resolved. Back to baseline. Cr stable at 1.21 this morning. Lytes grossly stable and he is having good UOP from Coude catheter. Expect continued improvement with IVF and holding nephrotoxic meds as able.  Prostate adenocarcinoma- Diagnosed with prostate adenocarcinoma, Gleason 7, back in 2016 and underwent androgen deprivation therapy at the time, no surgery or radiation. Has a chronic indwelling Foley catheter for urinary retentionand replaced with a Coude catheter6/14. No hematuria or dysuria this morning and having good urine output. Will continue to follow I/Os closely.   Dysphagia- Swallow evaluation recommended continued dysphagia 2 diet with thin liquids, which he was on at ALF.  C. Diff Ag positive, Toxigenic C. Diff Ag positive, Negative C. Diff Toxin: Studies obtained suggest patient is colonized with toxigenic C diff strain but toxin was negative - indicating he is likely not producing the actual C. Diff toxin currently. No excessive diarrhea and his leukocytosis is improving.  FEN: NPO, replete lytes prn, IV Protonix 40 mg BID VTE ppx: SCD's Code Status: DNR  Dispo: Anticipate>2 midnight stay for resolution of ileus and correction of acute anemia.   LOS: 4 days   Thornton Papas, Medical Student 10/03/2017, 6:39 AM Pager: [phone]  Attestation for Student Documentation:  I personally was present and performed or re-performed the history, physical exam and medical decision-making activities of this service and have verified that the service and findings are accurately documented in the student's note.  Kariah Loredo, DO 10/03/2017, 1:43 PM

## 2017-10-04 ENCOUNTER — Inpatient Hospital Stay (HOSPITAL_COMMUNITY): Payer: Medicare HMO

## 2017-10-04 ENCOUNTER — Other Ambulatory Visit: Payer: Self-pay

## 2017-10-04 ENCOUNTER — Encounter (HOSPITAL_COMMUNITY): Payer: Self-pay | Admitting: General Practice

## 2017-10-04 DIAGNOSIS — N390 Urinary tract infection, site not specified: Secondary | ICD-10-CM

## 2017-10-04 LAB — BASIC METABOLIC PANEL
Anion gap: 9 (ref 5–15)
BUN: 26 mg/dL — AB (ref 6–20)
CALCIUM: 7.9 mg/dL — AB (ref 8.9–10.3)
CO2: 19 mmol/L — ABNORMAL LOW (ref 22–32)
CREATININE: 1.2 mg/dL (ref 0.61–1.24)
Chloride: 117 mmol/L — ABNORMAL HIGH (ref 101–111)
GFR calc Af Amer: 57 mL/min — ABNORMAL LOW (ref >60)
GFR, EST NON AFRICAN AMERICAN: 49 mL/min — AB (ref >60)
Glucose, Bld: 79 mg/dL (ref 65–99)
Potassium: 3.5 mmol/L (ref 3.5–5.1)
SODIUM: 145 mmol/L (ref 135–145)

## 2017-10-04 LAB — CBC
HCT: 32.7 % — ABNORMAL LOW (ref 39.0–52.0)
Hemoglobin: 10.1 g/dL — ABNORMAL LOW (ref 13.0–17.0)
MCH: 28.3 pg (ref 26.0–34.0)
MCHC: 30.9 g/dL (ref 30.0–36.0)
MCV: 91.6 fL (ref 78.0–100.0)
PLATELETS: 259 10*3/uL (ref 150–400)
RBC: 3.57 MIL/uL — ABNORMAL LOW (ref 4.22–5.81)
RDW: 16.9 % — AB (ref 11.5–15.5)
WBC: 10.5 10*3/uL (ref 4.0–10.5)

## 2017-10-04 MED ORDER — IOPAMIDOL (ISOVUE-300) INJECTION 61%
INTRAVENOUS | Status: AC
  Administered 2017-10-04: 15:00:00
  Filled 2017-10-04: qty 30

## 2017-10-04 MED ORDER — IOHEXOL 300 MG/ML  SOLN
100.0000 mL | Freq: Once | INTRAMUSCULAR | Status: AC | PRN
  Administered 2017-10-04: 100 mL via INTRAVENOUS

## 2017-10-04 MED ORDER — SODIUM CHLORIDE 0.9 % IV SOLN
3.0000 g | Freq: Four times a day (QID) | INTRAVENOUS | Status: DC
  Filled 2017-10-04 (×2): qty 3

## 2017-10-04 MED ORDER — POTASSIUM CHLORIDE 10 MEQ/100ML IV SOLN
10.0000 meq | INTRAVENOUS | Status: AC
  Administered 2017-10-04 (×3): 10 meq via INTRAVENOUS
  Filled 2017-10-04 (×2): qty 100

## 2017-10-04 MED ORDER — SODIUM CHLORIDE 0.9 % IV SOLN
3.0000 g | Freq: Four times a day (QID) | INTRAVENOUS | Status: DC
  Administered 2017-10-04 – 2017-10-06 (×8): 3 g via INTRAVENOUS
  Filled 2017-10-04 (×9): qty 3

## 2017-10-04 MED ORDER — POTASSIUM CHLORIDE 10 MEQ/100ML IV SOLN
10.0000 meq | INTRAVENOUS | Status: DC
  Administered 2017-10-04 (×3): 10 meq via INTRAVENOUS
  Filled 2017-10-04 (×3): qty 100

## 2017-10-04 NOTE — Progress Notes (Signed)
[facility] Progress Note     Subjective: CC: needs to spit up phlegm Patient just had breathing treatment and complaining he needs to cough up some phlegm. Denies abdominal pain or nausea. States he is passing flatus. Per NT had a small soft BM this AM that was dark but not obviously bloody. Wants something to drink.   Objective: Vital signs in last 24 hours: Temp:  [97.3 F (36.3 C)-98.1 F (36.7 C)] 98.1 F (36.7 C) (06/19 0500) Pulse Rate:  [87-100] 100 (06/19 0500) Resp:  [16-18] 18 (06/19 0500) BP: (115-125)/(50-66) 125/66 (06/19 0500) SpO2:  [92 %-100 %] 97 % (06/19 0500) Last BM Date: 10/02/17  Intake/Output from previous day: 06/18 0701 - 06/19 0700 In: 316.3 [I.V.:29.5; IV Piggyback:286.8] Out: 3525 [Urine:3525] Intake/Output this shift: No intake/output data recorded.  PE: Gen:  Alert, NAD, pleasant Card:  Regular rate and rhythm, pedal pulses 2+ BL Pulm:  Normal effort, harsh tracheal breathing sounds Abd:  non-tender, moderately distended, bowel sounds hypoactive Skin: warm and dry   Lab Results:  Recent Labs    10/02/17 1042 10/02/17 2253 10/03/17 0603  WBC 7.9  --  9.5  HGB 6.0* 9.9* 9.1*  HCT 20.3* 32.3* 29.8*  PLT 201  --  204   BMET Recent Labs    10/03/17 0604 10/04/17 0547  NA 146* 145  K 4.0 3.5  CL 119* 117*  CO2 17* 19*  GLUCOSE 79 79  BUN 37* 26*  CREATININE 1.21 1.20  CALCIUM 7.9* 7.9*   PT/INR No results for input(s): LABPROT, INR in the last 72 hours. CMP     Component Value Date/Time   NA 145 10/04/2017 0547   K 3.5 10/04/2017 0547   CL 117 (H) 10/04/2017 0547   CO2 19 (L) 10/04/2017 0547   GLUCOSE 79 10/04/2017 0547   BUN 26 (H) 10/04/2017 0547   CREATININE 1.20 10/04/2017 0547   CALCIUM 7.9 (L) 10/04/2017 0547   PROT 4.8 (L) 10/03/2017 0604   ALBUMIN 2.2 (L) 10/03/2017 0604   AST 16 10/03/2017 0604   ALT 9 (L) 10/03/2017 0604   ALKPHOS 56 10/03/2017 0604   BILITOT 0.6 10/03/2017 0604   GFRNONAA 49  (L) 10/04/2017 0547   GFRAA 57 (L) 10/04/2017 0547   Lipase  No results found for: LIPASE     Studies/Results: Ct Abdomen Pelvis Wo Contrast  Result Date: 10/03/2017 CLINICAL DATA:  The patient's kv node small-bowel dilatation possibly related to obstruction. History of prostate cancer. Patient has active infection in the abdomen with urinary tract infection last/minus C difficile. Patient could have reactive ileus. EXAM: CT ABDOMEN AND PELVIS WITHOUT CONTRAST TECHNIQUE: Multidetector CT imaging of the abdomen and pelvis was performed following the standard protocol without IV contrast. COMPARISON:  06/07/2017 FINDINGS: Lower chest: Moderate right-sided pleural effusion with atelectasis. Loculated small left pleural effusion along the medial and posterolateral aspect of the left lung base. Posterior pleural plaque is redemonstrated on the left. Moderate-sized hiatal hernia is noted. Heart is normal in size with coronary arteriosclerosis. No pericardial effusion is seen. Nonspecific pulmonary opacity in the anterior left lower lobe may reflect a small focus of inflammation or atelectasis. Pulmonary neoplasm is believed less likely given lack of similar finding on prior recent comparison study. This measures up to 12 mm. Small subpleural area of a ground-glass opacity in the lingula adjacent to paraseptal emphysema. This may represent area of alveolitis or pneumonitis. Retained contrast in the distal esophagus may reflect mild reflux given  the hiatal hernia. Hepatobiliary: Small granuloma in the right hepatic lobe. No biliary dilatation. No definite space-occupying mass given limitations of a noncontrast study. Physiologically distended gallbladder without mural thickening or calculi. Pancreas: Atrophic pancreas without inflammation. Spleen: No splenomegaly or mass. Adrenals/Urinary Tract: Normal bilateral adrenal glands. No nephrolithiasis, obstructive uropathy or focal renal mass. The bladder is  decompressed by Foley catheter and contains dependent calculi. Stomach/Bowel: Decompressed stomach. Normal appearance of the duodenum with normal small bowel rotation proximally. Gradual contrast and fluid-filled distention of jejunal loops without transition point clearly identified. There is slow tapering of small bowel to normal in the right lower quadrant. Findings more likely represent small bowel ileus or dysmotility. Small bowel loops approach the internal ring of the right inguinal canal without herniation or obstruction. Distal and terminal ileum are decompressed and unremarkable. The appendix is normal. Scattered colonic diverticulosis without acute diverticulitis is noted. Amorphous mid pelvic soft tissue attenuation, series 3/76 measuring 4.4 x 2.8 cm is noted. Suspect decompressed bladder diverticulum. Short-term interval follow-up is recommended to determine whether this opacifies with enteric contrast and thus representing decompressed bowel versus an extraluminal abnormality. Vascular/Lymphatic: Moderate aortoiliac atherosclerosis. No aneurysm. Reproductive: Normal size prostate. Other: Mild soft tissue edema compatible with anasarca. Musculoskeletal: Thoracolumbar spondylosis. No aggressive osseous lesions. IMPRESSION: 1. Contrast and fluid-filled distention of jejunal loops without focal transition point identified. Findings may represent small bowel ileus or dysmotility rather than mechanical bowel obstruction. 2. Colonic diverticulosis without acute diverticulitis. 3. Amorphous 4.4 x 2.8 cm mid pelvic density series 3/76 may represent a decompressed bladder diverticulum as previously seen due to decompression of the bladder by Foley catheter on current study. Extraluminal evolving abscess or inflammatory phlegmon are among other possibilities in addition to decompressed bowel. Repeat imaging is recommended to determine whether the patient's ingested enteric contrast fills this area and thus  represents unopacified bowel versus an extraluminal abnormality and potentially contributing to the ileus bowel gas pattern. 4. Decompressed urinary bladder with calculi. No obstructive uropathy. 5. Moderate right and small left pleural effusions with pleural plaque on the left. 6. Thoracolumbar spondylosis. No osteoblastic disease in this patient with history of prostate cancer. 7. Diffuse mild soft tissue anasarca. Electronically Signed   By: Ashley Royalty M.D.   On: 10/03/2017 01:29   Dg Abd 1 View  Result Date: 10/03/2017 CLINICAL DATA:  Ileus versus small-bowel obstruction. Possible C difficile colitis. EXAM: ABDOMEN - 1 VIEW COMPARISON:  CT 10/02/2017 FINDINGS: Examination demonstrates multiple air-filled dilated small bowel loops over the central abdomen measuring up to 3.9 cm in diameter. No evidence of free peritoneal air. Air is present within the colon. There are moderate degenerative changes of the spine and mild degenerate changes of the hips. IMPRESSION: Air-filled dilated small bowel loops in the central abdomen as seen on patient's recent CT scan. Mild air within the colon. Findings are likely due to early/partial small bowel obstruction and less likely ileus. Electronically Signed   By: Marin Olp M.D.   On: 10/03/2017 19:28   Dg Chest Port 1 View  Result Date: 10/02/2017 CLINICAL DATA:  84 y/o M; wheezing developed wall patient was receiving blood transfusion today. Recently diagnosed pneumonia. EXAM: PORTABLE CHEST 1 VIEW COMPARISON:  09/29/2017 chest radiograph. FINDINGS: Stable cardiac silhouette given projection and technique. Aortic atherosclerosis with calcification. Increased moderate right pleural effusion and diffuse hazy opacification of the right lung. Reticular opacities and peripheral septal opacities at the left lung base. No acute osseous abnormality is evident. IMPRESSION: Increased  moderate right pleural effusion and interstitial edema. Hazy opacification of the right lung  may represent associated atelectasis or pneumonia. Electronically Signed   By: Kristine Garbe M.D.   On: 10/02/2017 19:42    Anti-infectives: Anti-infectives (From admission, onward)   Start     Dose/Rate Route Frequency Ordered Stop   10/04/17 1200  Ampicillin-Sulbactam (UNASYN) 3 g in sodium chloride 0.9 % 100 mL IVPB     3 g 200 mL/hr over 30 Minutes Intravenous Every 6 hours 10/04/17 0752     10/03/17 0800  vancomycin (VANCOCIN) IVPB 1000 mg/200 mL premix  Status:  Discontinued     1,000 mg 200 mL/hr over 60 Minutes Intravenous Every 24 hours 10/03/17 0754 10/03/17 1925   10/01/17 1400  piperacillin-tazobactam (ZOSYN) IVPB 3.375 g  Status:  Discontinued     3.375 g 12.5 mL/hr over 240 Minutes Intravenous Every 8 hours 10/01/17 0816 10/04/17 0752   10/01/17 0600  vancomycin (VANCOCIN) IVPB 1000 mg/200 mL premix  Status:  Discontinued     1,000 mg 200 mL/hr over 60 Minutes Intravenous Every 48 hours 09/29/17 1531 09/29/17 2055   09/30/17 0900  piperacillin-tazobactam (ZOSYN) IVPB 2.25 g  Status:  Discontinued     2.25 g 100 mL/hr over 30 Minutes Intravenous Every 6 hours 09/30/17 0814 10/01/17 0816   09/30/17 0600  vancomycin (VANCOCIN) IVPB 1000 mg/200 mL premix  Status:  Discontinued     1,000 mg 200 mL/hr over 60 Minutes Intravenous Every 48 hours 09/30/17 0547 09/30/17 0749   09/30/17 0600  ampicillin-sulbactam (UNASYN) 1.5 g in sodium chloride 0.9 % 100 mL IVPB  Status:  Discontinued     1.5 g 200 mL/hr over 30 Minutes Intravenous Every 12 hours 09/30/17 0547 09/30/17 0749   09/29/17 2200  cefTRIAXone (ROCEPHIN) 2 g in sodium chloride 0.9 % 100 mL IVPB  Status:  Discontinued     2 g 200 mL/hr over 30 Minutes Intravenous Every 24 hours 09/29/17 2055 09/30/17 0527   09/29/17 1600  piperacillin-tazobactam (ZOSYN) IVPB 2.25 g  Status:  Discontinued     2.25 g 100 mL/hr over 30 Minutes Intravenous Every 6 hours 09/29/17 1531 09/29/17 2055   09/29/17 0445   piperacillin-tazobactam (ZOSYN) IVPB 3.375 g     3.375 g 100 mL/hr over 30 Minutes Intravenous  Once 09/29/17 0436 09/29/17 0517   09/29/17 0445  vancomycin (VANCOCIN) IVPB 1000 mg/200 mL premix     1,000 mg 200 mL/hr over 60 Minutes Intravenous  Once 09/29/17 0436 09/29/17 0551       Assessment/Plan AKI on CKD stage 3 Prostate adenocarcinoma Proteus bacteremia Possibly aspiration PNA Dysphagia C.Diff antigen positive, toxin negative - patient not having diarrhea, not sure he needs to be on enteric precautions  SBO vs ileus - continued distention, KUB not done yet today - patient did have small bowel movement this AM that was soft and dark but not frankly bloody - if improvement in film, may be able to advance to sips of clears - mobilize as able - recommend palliative care consult to establish goals of care  Hypokalemia - K 3.5, goal > 4.0 in setting of potential ileus; replace and recheck in AM, check Mg as well ABL anemia/GIB - Hgb 9.1 6/18, GI consulting as well   FEN: ice chips, IVF VTE: SCDs ID: vanc/zosyn 6/14; rocephin 6/14; unasyn 6/15>>  LOS: 5 days    Brigid Re , [facility] 10/04/2017, 8:58 AM Pager: [phone] Consults: [phone] Mon-Fri 7:00  am-4:30 pm Sat-Sun 7:00 am-11:30 am

## 2017-10-04 NOTE — Progress Notes (Signed)
Palliative care progress note  I met today with Anthony Nicholson and his daughter.  Patient was working to get KUB completed and get cleaned up and agreed for me to speak with his daughter outside the room while he did this.   I introduced palliative care as specialized medical care for people living with serious illness. It focuses on providing relief from the symptoms and stress of a serious illness. The goal is to improve quality of life for both the patient and the family.  His daughter and discussed clinical course as well as wishes moving forward in regard to advanced directives and continued care this hospitalization.  Values and goals of care important to patient and family were attempted to be elicited. His daughter reports that he is very social and the things that matter most to him are family (herself and her husband) and his faith.  He has always been very involved in his church.  We discussed changes she has been seeing in his nutrition and functional status.  She understands that he is of advanced age and wants to ensure that care is focused on him living as well as possible for as long as possible.    We discussed difference between a aggressive medical intervention path and a palliative, comfort focused care path.  Discussed unknowns over the next few days regarding multiple comorbidities, including SBO vs ileus and GI bleed (Hgb currently stable).  His daughter appreciates information, but feels that they need to get better idea of what is going from GI perspective prior to making further decisions.  - DNR/DNI - Continue current care including continued workup with pending KUB and CT scan.  Daughter reports that they will talk further about proposed more invasive interventions, such as EGD. - Plan for another family meeting on Friday at 0930 to continue conversation based on clinical course over the next 48 hours.  I gave her a copy of Hard Choices for Loving People to review in the  interim.    Questions and concerns addressed.   PMT will continue to support holistically.  Total time: 60 minutes Greater than 50%  of this time was spent counseling and coordinating care related to the above assessment and plan.  Micheline Rough, MD [place] [facility] [phone]

## 2017-10-04 NOTE — Progress Notes (Addendum)
Daily Rounding Note  10/04/2017, 10:01 AM  LOS: 5 days   SUBJECTIVE:   Chief complaint:    BM reported yesterday.  Patient denies BM today.  No abdominal pain but is tender to palpation.  No nausea..    OBJECTIVE:         Vital signs in last 24 hours:    Temp:  [97.3 F (36.3 C)-98.1 F (36.7 C)] 98.1 F (36.7 C) (06/19 0500) Pulse Rate:  [87-100] 100 (06/19 0500) Resp:  [16-18] 18 (06/19 0500) BP: (115-125)/(50-66) 125/66 (06/19 0500) SpO2:  [92 %-100 %] 97 % (06/19 0500) Last BM Date: 10/02/17 Filed Weights   09/29/17 0412  Weight: 136 lb (61.7 kg)   General: Frail, aged, alert Heart: RRR.  No MRG.  S1, S2 present. Chest: Clear bilaterally. Abdomen: Tense, slightly distended.  Tender from the midline into the left abdomen.  No guarding or rebound.  Bowel sounds hypoactive. Extremities: No CCE. Neuro/Psych: Appropriate.  Follows commands.  Intake/Output from previous day: 06/18 0701 - 06/19 0700 In: 316.3 [I.V.:29.5; IV Piggyback:286.8] Out: 1610 [Urine:3525]  Intake/Output this shift: No intake/output data recorded.  Lab Results: Recent Labs    10/02/17 1042 10/02/17 2253 10/03/17 0603 10/04/17 0843  WBC 7.9  --  9.5 10.5  HGB 6.0* 9.9* 9.1* 10.1*  HCT 20.3* 32.3* 29.8* 32.7*  PLT 201  --  204 259   BMET Recent Labs    10/02/17 0712 10/03/17 0604 10/04/17 0547  NA 144 146* 145  K 3.8 4.0 3.5  CL 117* 119* 117*  CO2 21* 17* 19*  GLUCOSE 108* 79 79  BUN 47* 37* 26*  CREATININE 1.13 1.21 1.20  CALCIUM 7.5* 7.9* 7.9*   LFT Recent Labs    10/03/17 0604  PROT 4.8*  ALBUMIN 2.2*  AST 16  ALT 9*  ALKPHOS 56  BILITOT 0.6   PT/INR No results for input(s): LABPROT, INR in the last 72 hours. Hepatitis Panel No results for input(s): HEPBSAG, HCVAB, HEPAIGM, HEPBIGM in the last 72 hours.  Studies/Results: Ct Abdomen Pelvis Wo Contrast  Result Date: 10/03/2017 CLINICAL DATA:  The  patient's kv node small-bowel dilatation possibly related to obstruction. History of prostate cancer. Patient has active infection in the abdomen with urinary tract infection last/minus C difficile. Patient could have reactive ileus. EXAM: CT ABDOMEN AND PELVIS WITHOUT CONTRAST TECHNIQUE: Multidetector CT imaging of the abdomen and pelvis was performed following the standard protocol without IV contrast. COMPARISON:  06/07/2017 FINDINGS: Lower chest: Moderate right-sided pleural effusion with atelectasis. Loculated small left pleural effusion along the medial and posterolateral aspect of the left lung base. Posterior pleural plaque is redemonstrated on the left. Moderate-sized hiatal hernia is noted. Heart is normal in size with coronary arteriosclerosis. No pericardial effusion is seen. Nonspecific pulmonary opacity in the anterior left lower lobe may reflect a small focus of inflammation or atelectasis. Pulmonary neoplasm is believed less likely given lack of similar finding on prior recent comparison study. This measures up to 12 mm. Small subpleural area of a ground-glass opacity in the lingula adjacent to paraseptal emphysema. This may represent area of alveolitis or pneumonitis. Retained contrast in the distal esophagus may reflect mild reflux given the hiatal hernia. Hepatobiliary: Small granuloma in the right hepatic lobe. No biliary dilatation. No definite space-occupying mass given limitations of a noncontrast study. Physiologically distended gallbladder without mural thickening or calculi. Pancreas: Atrophic pancreas without inflammation. Spleen: No splenomegaly or mass. Adrenals/Urinary  Tract: Normal bilateral adrenal glands. No nephrolithiasis, obstructive uropathy or focal renal mass. The bladder is decompressed by Foley catheter and contains dependent calculi. Stomach/Bowel: Decompressed stomach. Normal appearance of the duodenum with normal small bowel rotation proximally. Gradual contrast and  fluid-filled distention of jejunal loops without transition point clearly identified. There is slow tapering of small bowel to normal in the right lower quadrant. Findings more likely represent small bowel ileus or dysmotility. Small bowel loops approach the internal ring of the right inguinal canal without herniation or obstruction. Distal and terminal ileum are decompressed and unremarkable. The appendix is normal. Scattered colonic diverticulosis without acute diverticulitis is noted. Amorphous mid pelvic soft tissue attenuation, series 3/76 measuring 4.4 x 2.8 cm is noted. Suspect decompressed bladder diverticulum. Short-term interval follow-up is recommended to determine whether this opacifies with enteric contrast and thus representing decompressed bowel versus an extraluminal abnormality. Vascular/Lymphatic: Moderate aortoiliac atherosclerosis. No aneurysm. Reproductive: Normal size prostate. Other: Mild soft tissue edema compatible with anasarca. Musculoskeletal: Thoracolumbar spondylosis. No aggressive osseous lesions. IMPRESSION: 1. Contrast and fluid-filled distention of jejunal loops without focal transition point identified. Findings may represent small bowel ileus or dysmotility rather than mechanical bowel obstruction. 2. Colonic diverticulosis without acute diverticulitis. 3. Amorphous 4.4 x 2.8 cm mid pelvic density series 3/76 may represent a decompressed bladder diverticulum as previously seen due to decompression of the bladder by Foley catheter on current study. Extraluminal evolving abscess or inflammatory phlegmon are among other possibilities in addition to decompressed bowel. Repeat imaging is recommended to determine whether the patient's ingested enteric contrast fills this area and thus represents unopacified bowel versus an extraluminal abnormality and potentially contributing to the ileus bowel gas pattern. 4. Decompressed urinary bladder with calculi. No obstructive uropathy. 5.  Moderate right and small left pleural effusions with pleural plaque on the left. 6. Thoracolumbar spondylosis. No osteoblastic disease in this patient with history of prostate cancer. 7. Diffuse mild soft tissue anasarca. Electronically Signed   By: Ashley Royalty M.D.   On: 10/03/2017 01:29   Dg Abd 1 View  Result Date: 10/03/2017 CLINICAL DATA:  Ileus versus small-bowel obstruction. Possible C difficile colitis. EXAM: ABDOMEN - 1 VIEW COMPARISON:  CT 10/02/2017 FINDINGS: Examination demonstrates multiple air-filled dilated small bowel loops over the central abdomen measuring up to 3.9 cm in diameter. No evidence of free peritoneal air. Air is present within the colon. There are moderate degenerative changes of the spine and mild degenerate changes of the hips. IMPRESSION: Air-filled dilated small bowel loops in the central abdomen as seen on patient's recent CT scan. Mild air within the colon. Findings are likely due to early/partial small bowel obstruction and less likely ileus. Electronically Signed   By: Marin Olp M.D.   On: 10/03/2017 19:28   Dg Chest Port 1 View  Result Date: 10/02/2017 CLINICAL DATA:  84 y/o M; wheezing developed wall patient was receiving blood transfusion today. Recently diagnosed pneumonia. EXAM: PORTABLE CHEST 1 VIEW COMPARISON:  09/29/2017 chest radiograph. FINDINGS: Stable cardiac silhouette given projection and technique. Aortic atherosclerosis with calcification. Increased moderate right pleural effusion and diffuse hazy opacification of the right lung. Reticular opacities and peripheral septal opacities at the left lung base. No acute osseous abnormality is evident. IMPRESSION: Increased moderate right pleural effusion and interstitial edema. Hazy opacification of the right lung may represent associated atelectasis or pneumonia. Electronically Signed   By: Kristine Garbe M.D.   On: 10/02/2017 19:42    ASSESMENT:   *   SBO  vs ileus.   vanc/zosyn 6/14;  rocephin 6/14; unasyn 6/15>>  *   Dark emesis.  FOBT +, resolved.  Has not vomited since he is been n.p.o. for the last 3 days.  *   Acute on chronic anemia.  Hgb 6 >> 2 U PRBC>> 9.9 >> 9.1 >> 10.1.    *   Azotemia.  BUN 40 >> 47 >> 37 >> 26.     PLAN   *   Await reading of today's KUB.  Keep NPO.  Agree that goals of care consult in order.  However this is going to be complicated because we do not really know the potential outcome of his GI issue.   Azucena Freed  10/04/2017, 10:01 AM Phone [phone]     Attending physician's note   I have taken an interval history, reviewed the chart and examined the patient. I agree with the Advanced Practitioner's note, impression and recommendations. SBO vs ileus, improved. Anemia improved post transfusion. No evidence of recurrent GI bleeding. Abd films pending. Need repeat CT to clarify pelvic findings.   Lucio Edward, MD FACG [phone] office

## 2017-10-04 NOTE — Consult Note (Signed)
Consultation Note Date: 10/04/2017   Patient Name: Anthony Nicholson  DOB: [birth year]  MRN: [id number]  Age / Sex: 84 y.o., male  PCP: [facility] Referring Physician: Aldine Contes, MD  Reason for Consultation: Establishing goals of care  HPI/Patient Profile: 84 y.o. male  with past medical history of prostate adenocarcinoma, urinary retention w/ indwelling catheter CKD, hypothyroidism, dysphagia, lives at [facility] ALF admitted on 09/29/2017 with sepsis related to UTI, Ileus, and ? Asp PNA.  Palliative consulted for [place].   Clinical Assessment and Goals of Care: I met today with Anthony Nicholson.  We discussed things that are most important to him.    He reports that his faith and his family, particularly his daughter, are most important things to him.    He states that the doctors have been talking with him, but when asked what he has been told, he Is not able to relay what is going on medically.   He will redirect conversation to discuss his family and past experiences, particularly regarding his faith and church.  SUMMARY OF RECOMMENDATIONS   - Will call to arrange meeting with family.  Hopeful this can occur tomorrow.  Code Status/Advance Care Planning:  DNR  Palliative Prophylaxis:   Delirium Protocol and Frequent Pain Assessment  Psycho-social/Spiritual:   Desire for further Chaplaincy support:Did not address today.  Additional Recommendations: Caregiving  Support/Resources  Prognosis:   Unable to determine  Discharge Planning: To Be Determined      Primary Diagnoses: Present on Admission: . Sepsis ([place]) . CKD (chronic kidney disease) stage 3, GFR 30-59 ml/min (HCC) . Urinary retention . Lower urinary tract infectious disease . Acute encephalopathy   I have reviewed the medical record, interviewed the patient and family, and examined the patient. The following aspects  are pertinent.  Past Medical History:  Diagnosis Date  . Basal cell carcinoma   . Chronic indwelling Foley catheter   . HOH (hard of hearing)   . Indwelling urinary catheter present   . Laceration of arm 02/04/2013  . Prostate cancer ([place])   . Recurrent UTI    Social History   Socioeconomic History  . Marital status: Single    Spouse name: Not on file  . Number of children: Not on file  . Years of education: Not on file  . Highest education level: Not on file  Occupational History  . Occupation: Retired  Scientific laboratory technician  . Financial resource strain: Not on file  . Food insecurity:    Worry: Not on file    Inability: Not on file  . Transportation needs:    Medical: Not on file    Non-medical: Not on file  Tobacco Use  . Smoking status: Never Smoker  . Smokeless tobacco: Never Used  Substance and Sexual Activity  . Alcohol use: No    Alcohol/week: 0.0 oz  . Drug use: No  . Sexual activity: Not Currently  Lifestyle  . Physical activity:    Days per week: Not on file  Minutes per session: Not on file  . Stress: Not on file  Relationships  . Social connections:    Talks on phone: Not on file    Gets together: Not on file    Attends religious service: Not on file    Active member of club or organization: Not on file    Attends meetings of clubs or organizations: Not on file    Relationship status: Not on file  Other Topics Concern  . Not on file  Social History Narrative   Currently lives in [place]. Uses cane.   Family History  Problem Relation Age of Onset  . Glaucoma Mother   . Stroke Father   . Prostate cancer Neg Hx    Scheduled Meds: . Chlorhexidine Gluconate Cloth  6 each Topical Q0600  . ipratropium-albuterol  3 mL Nebulization BID  . levothyroxine  25 mcg Oral QAC breakfast  . mouth rinse  15 mL Mouth Rinse BID  . mupirocin ointment  1 application Nasal BID  . pantoprazole (PROTONIX) IV  40 mg Intravenous Q12H   Continuous  Infusions: . sodium chloride 10 mL/hr at 10/03/17 1510  . ampicillin-sulbactam (UNASYN) IV    . potassium chloride     PRN Meds:.benzonatate, MUSCLE RUB, ondansetron (ZOFRAN) IV Medications Prior to Admission:  Prior to Admission medications   Medication Sig Start Date End Date Taking? Authorizing Provider  acetaminophen (TYLENOL) 325 MG tablet Take 650 mg by mouth every 6 (six) hours as needed for mild pain.   Yes [provider]  carboxymethylcellulose (REFRESH PLUS) 0.5 % SOLN Place 1 drop into both eyes 3 (three) times daily as needed (dry eye).   Yes [provider]  cholecalciferol (VITAMIN D) 1000 units tablet Take 2,000 Units by mouth daily.   Yes [provider]  folic acid (FOLVITE) 1 MG tablet Take 1 mg by mouth daily.   Yes [provider]  levothyroxine (SYNTHROID, LEVOTHROID) 25 MCG tablet Take 25 mcg by mouth daily before breakfast.   Yes [provider]  loperamide (IMODIUM A-D) 2 MG tablet Take 2 mg by mouth as needed for diarrhea or loose stools.   Yes [provider]  Menthol, Topical Analgesic, (BIOFREEZE) 4 % GEL Apply topically daily as needed (for pain to upper back and bilateral shoulder).   Yes [provider]  UNABLE TO FIND Take 120 mLs by mouth 2 (two) times daily. Med Name: MedPass 2.0   Yes [provider]  vitamin B-12 (CYANOCOBALAMIN) 500 MCG tablet Take 500 mcg by mouth daily.   Yes [provider]  saccharomyces boulardii (FLORASTOR) 250 MG capsule Take 1 capsule (250 mg total) by mouth 2 (two) times daily. Patient not taking: Reported on 09/29/2017 06/12/17   Florencia Reasons, MD  sodium bicarbonate 650 MG tablet Take 1 tablet (650 mg total) by mouth daily at 12 noon. Patient not taking: Reported on 09/29/2017 06/13/17   Florencia Reasons, MD   No Known Allergies Review of Systems Denies complaints, but not reliable historian  Physical Exam  General: Alert, awake, in no acute distress. Frail  and elderly. HEENT: No bruits, no goiter Heart: Tachycardic. No murmur appreciated. Lungs: Diminshed air movement, scattered wheeze Abdomen: Soft, tender, mild distended, positive bowel sounds.  Ext: No significant edema Skin: Warm and dry Neuro: Grossly intact, nonfocal.   Vital Signs: BP 125/66 (BP Location: Left Arm)   Pulse 100   Temp 98.1 F (36.7 C) (Oral)   Resp 18  Ht 5' 6"  (1.676 m)   Wt 61.7 kg (136 lb)   SpO2 97%   BMI 21.95 kg/m  Pain Scale: 0-10   Pain Score: 0-No pain   SpO2: SpO2: 97 % O2 Device:SpO2: 97 % O2 Flow Rate: .O2 Flow Rate (L/min): 2 L/min  IO: Intake/output summary:   Intake/Output Summary (Last 24 hours) at 10/04/2017 0857 Last data filed at 10/04/2017 0630 Gross per 24 hour  Intake 316.28 ml  Output 3525 ml  Net -3208.72 ml    LBM: Last BM Date: 10/02/17 Baseline Weight: Weight: 61.7 kg (136 lb) Most recent weight: Weight: 61.7 kg (136 lb)     Palliative Assessment/Data:     Time In: 1600 Time Out: 1700 Time Total: 60 Greater than 50%  of this time was spent counseling and coordinating care related to the above assessment and plan.  Signed by: Micheline Rough, MD   Please contact Palliative Medicine Team phone at [phone] for questions and concerns.  For individual provider: See Shea Evans

## 2017-10-04 NOTE — Progress Notes (Addendum)
Subjective:  Anthony Nicholson was seen lying in bed this morning. Family was not at bedside. He denies any further nausea, vomiting, or abdominal pain. He says he did not have a bowel movement yesterday or overnight. He feels like his abdominal distension is improving. He denies any dysuria or hematuria. He enjoys working with PT and was able to ambulate with minimal assistance yesterday. He states his only concerns are that he feels hoarse and has a dry mouth.  Objective:  Vital signs in last 24 hours: Vitals:   10/03/17 1423 10/03/17 1432 10/03/17 2122 10/04/17 0500  BP: 125/63  (!) 115/50 125/66  Pulse: 87  93 100  Resp: 16  17 18   Temp: (!) 97.5 F (36.4 C)  (!) 97.3 F (36.3 C) 98.1 F (36.7 C)  TempSrc: Axillary  Oral Oral  SpO2: 100% 92% 94% 97%  Weight:      Height:       Weight change:   Intake/Output Summary (Last 24 hours) at 10/04/2017 1128 Last data filed at 10/04/2017 0630 Gross per 24 hour  Intake 66.28 ml  Output 2325 ml  Net -2258.72 ml   GENERAL-Frailbut pleasant elderly caucasianman in no acute distress; conversant HEENT-Dryoral mucosawith minimal dried old blood on tongue; bilateral hearing aids in place, no cervical lymphadenopathy. CARDIAC-Normal rate, regular rhythm, no murmur. Distal pulses 2+ bilaterally. No LE edema. No JVD. Capillary refill <3 secin distal phalanges. RESP- Breathing comfortably on room air. Symmetric chest wall expansion. Mild wheezing bilaterally, improving.Nocrackles or rhonchi.  ABDOMEN-Bowel sounds are normoactive. Mildly distended. Mild diffuse tenderness to palpation, improving. No rebound or guarding. No hepatosplenomegaly. NEURO- Alert and oriented to self. PERRLA. EOM's intact.Moves all extremities appropriately. SKIN-Diffuse actinic keratoses, bruising, no rashes. Skin warm and dry.   Assessment/Plan: Active Problems: Sepsis with Proteus bacteremia Possible Aspiration Pneumonia Ileus vs. SBO AcuteAnemia AKI  on CKD stage 3 Prostate adenocarcinoma Dysphagia  84 year old male with PMH of urinary obstruction from prostate cancer with chronic indwelling Foley catheter, CKD stage III, hypothyroidism, and baseline dysphagia admitted with proteus bacteremia (indwelling foley with frequent changes), aspiration pneumonia, ileus vs sbo and GIB.   Sepsis,Proteus bacteremia Possible Aspiration Pneumonia- Proteus bacteremia from indwelling foley with frequent instrumentation.Remains afebrile, mental status improving. Blood culture was pansensitive. There was concern for possible aspiration PNAon initial presentation, so taking this into account with antibiotic regimen. His wheezing and work of breathing have improved since yesterday after Lasix and Duonebs.  -Will narrow antibiotics to IV Unasyn today -f/u repeat Blood cultures -PRN Duonebs  -Strict I/O's  -Delirium precautions  Ileus vs. SBO-No additional episodes of emesis or BM's overnight. Denies any nausea. Abdomen is still mildly distended and tender to palpation this morning but bowel sounds normoactive. KUB today showed dilated small bowel loops concerning for SBO without significant interval change from yesterday. GI recommended repeat CT abdomen/pelvis for further evaluation of the nonspecific 4.4x2.8 cm pelvic soft tissue attenuation.  -Sips/chips with oral care -Obtain repeat CT abdomen/pelvis today, per GI recs -If continued emesis or worsening distension, consider NG tube for decompression -No role for surgery at this point, per Gen Surg but will continue to monitor closely -Goal K+>4.0 -Palliative care consult for goals of care discussion with family -Appreciate GI and Gen Surg recs  Acuteon Chronic Anemia- No additional episodes of hematemesis yesterday or overnight. Hgb trending up at 10.1 this morning. Has known chronic anemia and received IV iron in the past. Acute anemia likely 2/2 to GI bleed given  hematemesis and may represent  stress ulcer, mallory weiss tear, or malignancy. Continue to monitor closely. -IV Protonix 40 mg BID -CBC in AM -Consider EGD when abdominal distension improves, per GI -Appreciate GI recs   AKI on CKD stage 3-Resolved. Back to baseline.Cr stable at 1.20this morning. Lytes grossly stable and he is having good UOP from Coude catheter. Expect continued improvement with IVF and holding nephrotoxic meds as able.  Prostate adenocarcinoma- Diagnosed with prostate adenocarcinoma, Gleason 7, back in 2016 and underwent androgen deprivation therapy at the time, no surgery or radiation. Has a chronic indwelling Foley catheter for urinary retentionand replaced with a Coude catheter6/14. No hematuria or dysuria this morning and having good urine output. Will continue to follow I/Os closely.   Dysphagia- Swallow evaluation recommended continued dysphagia 2 diet with thin liquids, which he was on at ALF.  OMA:YOKH/TXHFS, replete lytes prn, IV Protonix 40 mg BID VTE ppx: SCD's Code Status: DNR  Dispo:Anticipate>2 midnight stay for resolution of ileus and correction of acute anemia.     LOS: 5 days   Thornton Papas, Medical Student 10/04/2017, 11:28 AM Pager: [phone]  Attestation for Student Documentation:  I personally was present and performed or re-performed the history, physical exam and medical decision-making activities of this service and have verified that the service and findings are accurately documented in the student's note.   No further hematemesis and patient had BM overnight. Abdominal exam improved with normoactive bowel sounds and minimal TTP. No evidence for recurrent hematemesis. Ordering repeat CT abdomen/pelvis today to hopefully characterize the pelvic lesion. Appreciate palliative care working with family to discuss goals of care.   Donyea Gafford, DO 10/04/2017, 2:39 PM

## 2017-10-04 NOTE — Social Work (Signed)
CSW acknowledging consult, aware pt is from [place], will be able to get SNF level care at [facility]. Admissions staff have initiated insurance auth through pt's [facility].   Will continue to follow.  Alexander Mt, [place] Work [phone]

## 2017-10-04 NOTE — Progress Notes (Signed)
Pharmacy Antibiotic Note  Anthony Nicholson is a 84 y.o. male admitted on 09/29/2017 with Proteus bacteremia and suspected aspiration PNA. Has been treated with Zosyn, pharmacy now consulted to switch to Unasyn. Now AF, WBC WNL. Scr stable 1.2, estimated CrCl ~30.  Plan: Unasyn 3g IV q6h  CrCl borderline for dose adjustment- watch renal function closely F/u transition to PO abx, LOT  Height: 5\' 6"  (167.6 cm) Weight: 136 lb (61.7 kg) IBW/kg (Calculated) : 63.8  Temp (24hrs), Avg:97.6 F (36.4 C), Min:97.3 F (36.3 C), Max:98.1 F (36.7 C)  Recent Labs  Lab 09/29/17 0436 09/29/17 0639 09/29/17 1043 09/30/17 0627 10/01/17 0526 10/02/17 0712 10/02/17 1042 10/03/17 0603 10/03/17 0604 10/04/17 0547  WBC  --   --   --  15.4* 12.4* 9.6 7.9 9.5  --   --   CREATININE  --   --   --  1.95* 1.30* 1.13  --   --  1.21 1.20  LATICACIDVEN 3.30* 2.50* 1.7  --   --   --   --   --   --   --     Estimated Creatinine Clearance: 30.7 mL/min (by C-G formula based on SCr of 1.2 mg/dL).    No Known Allergies  Vanc 6/14 >> 6/15, 6/17 >> 6/18 Unasyn 6/15 >> 6/15, 6/19 >> Zosyn 6/14 >> 6/14, 6/15 >> 6/19 CTX 6/14 >> 6/15  6/14 MRSA PCR - positive 6/14 BCx - Proteus (pan-S) 6/14 UCx - suggest recollection 6/15 C.diff quick scan - Ag positive 6/15 C.diff PCR - positive (little to no toxin production, tx only if clinically relevant)  Anthony Nicholson, PharmD PGY1 Pharmacy Resident Pager: [phone] 10/04/2017, 7:53 AM

## 2017-10-05 DIAGNOSIS — K5909 Other constipation: Secondary | ICD-10-CM

## 2017-10-05 DIAGNOSIS — K56609 Unspecified intestinal obstruction, unspecified as to partial versus complete obstruction: Secondary | ICD-10-CM | POA: Diagnosis present

## 2017-10-05 LAB — BASIC METABOLIC PANEL
Anion gap: 11 (ref 5–15)
BUN: 17 mg/dL (ref 6–20)
CHLORIDE: 114 mmol/L — AB (ref 101–111)
CO2: 20 mmol/L — ABNORMAL LOW (ref 22–32)
Calcium: 8.1 mg/dL — ABNORMAL LOW (ref 8.9–10.3)
Creatinine, Ser: 1.19 mg/dL (ref 0.61–1.24)
GFR, EST AFRICAN AMERICAN: 57 mL/min — AB (ref >60)
GFR, EST NON AFRICAN AMERICAN: 49 mL/min — AB (ref >60)
Glucose, Bld: 63 mg/dL — ABNORMAL LOW (ref 65–99)
POTASSIUM: 3.7 mmol/L (ref 3.5–5.1)
SODIUM: 145 mmol/L (ref 135–145)

## 2017-10-05 LAB — MAGNESIUM: MAGNESIUM: 1.6 mg/dL — AB (ref 1.7–2.4)

## 2017-10-05 MED ORDER — MAGNESIUM SULFATE 2 GM/50ML IV SOLN
2.0000 g | Freq: Once | INTRAVENOUS | Status: AC
  Administered 2017-10-05: 2 g via INTRAVENOUS
  Filled 2017-10-05: qty 50

## 2017-10-05 MED ORDER — POTASSIUM CHLORIDE CRYS ER 20 MEQ PO TBCR
20.0000 meq | EXTENDED_RELEASE_TABLET | Freq: Two times a day (BID) | ORAL | Status: AC
  Administered 2017-10-05 – 2017-10-06 (×3): 20 meq via ORAL
  Filled 2017-10-05 (×3): qty 1

## 2017-10-05 MED ORDER — PANTOPRAZOLE SODIUM 40 MG PO TBEC
40.0000 mg | DELAYED_RELEASE_TABLET | Freq: Every day | ORAL | Status: DC
  Administered 2017-10-05 – 2017-10-08 (×4): 40 mg via ORAL
  Filled 2017-10-05 (×3): qty 1

## 2017-10-05 NOTE — Social Work (Signed)
CSW received call from [facility], they have received authorization through [facility] for pt discharge to SNF at [facility]. Josem Kaufmann #[phone].  Authorization good as long as pt discharges before Sunday 6/22, if pt discharges after then updated clinicals will have to be sent to [facility].   Alexander Mt, [place] Work [phone]

## 2017-10-05 NOTE — Progress Notes (Signed)
OT Cancellation Note  Patient Details Name: ABHIMANYU CRUCES MRN: [id number] DOB: [birth year]   Cancelled Treatment:    Reason Eval/Treat Not Completed: Other (comment). Pt reporting feeling poorly today and currently has out of state relatives visiting with him in the room. Pt politely requesting OT to come back later. Plan to reattempt later today or tomorrow.   Hortencia Pilar 10/05/2017, 12:37 PM

## 2017-10-05 NOTE — Progress Notes (Signed)
PT Cancellation Note  Patient Details Name: Anthony Nicholson MRN: [id number] DOB: 02/10/1918   Cancelled Treatment:    Reason Eval/Treat Not Completed: Other (comment).  Pt already up in chair and nursing care in progress. Will f/u as time allows.    Michel Santee 10/05/2017, 10:32 AM

## 2017-10-05 NOTE — Progress Notes (Addendum)
Daily Rounding Note  10/05/2017, 10:00 AM  LOS: 6 days   SUBJECTIVE:   Chief complaint:  Denies abd pain, nausea.  Some flatus.  BM reported yesterday, tech confirms liquid brown stool this AM.   Diet advanced to clears this AM by gen surgery, so far he tolerated.      Family and pt met for goals of care with Pall care  OBJECTIVE:         Vital signs in last 24 hours:    Temp:  [97.5 F (36.4 C)-98.2 F (36.8 C)] 97.5 F (36.4 C) (06/20 0514) Pulse Rate:  [67-88] 88 (06/20 0514) Resp:  [16-18] 16 (06/20 0514) BP: (101-134)/(63-75) 134/75 (06/20 0514) SpO2:  [96 %-100 %] 98 % (06/20 0514) Last BM Date: 10/03/17 Filed Weights   09/29/17 0412  Weight: 136 lb (61.7 kg)   General: frail, aged.     Heart: regular, mildly tachy in low 100s Chest: Sounds more "gunky" in upper airway/throat.  Wet focal quality Abdomen: still moderately tense but minimally distended.  Still mildly distended on left of midline.  BS hypoactive but not tinkling or tympanitic.   Extremities: no CCE Neuro/Psych:  Alert, appropriate.  Speech difficult to understand.  Follows commands and moves all 4 limbs.     Intake/Output from previous day: 06/19 0701 - 06/20 0700 In: 960.9 [IV Piggyback:960.9] Out: 1000 [Urine:1000]  Intake/Output this shift: No intake/output data recorded.  Lab Results: Recent Labs    10/02/17 1042 10/02/17 2253 10/03/17 0603 10/04/17 0843  WBC 7.9  --  9.5 10.5  HGB 6.0* 9.9* 9.1* 10.1*  HCT 20.3* 32.3* 29.8* 32.7*  PLT 201  --  204 259   BMET Recent Labs    10/03/17 0604 10/04/17 0547 10/05/17 0639  NA 146* 145 145  K 4.0 3.5 3.7  CL 119* 117* 114*  CO2 17* 19* 20*  GLUCOSE 79 79 63*  BUN 37* 26* 17  CREATININE 1.21 1.20 1.19  CALCIUM 7.9* 7.9* 8.1*    Studies/Results: Ct Abdomen Pelvis W Contrast Result Date: 10/04/2017 CLINICAL DATA:  Abdominal distention. Small bowel obstruction. Possible  pelvic mass. EXAM: CT ABDOMEN AND PELVIS WITH CONTRAST TECHNIQUE: Multidetector CT imaging of the abdomen and pelvis was performed using the standard protocol following bolus administration of intravenous contrast. CONTRAST:  19m OMNIPAQUE IOHEXOL 300 MG/ML  SOLN COMPARISON:  10/02/2017 and 06/07/2017 FINDINGS: Lower Chest: No significant change in large right pleural effusion and small left pleural effusion. Hepatobiliary: No hepatic masses identified. Gallbladder is unremarkable. Pancreas:  No mass or inflammatory changes. Spleen: Within normal limits in size and appearance. Adrenals/Urinary Tract: Beam hardening artifact seen from position patient arms, however no definite renal mass or hydronephrosis. The urinary bladder is nearly completely empty with Foley catheter in place, but again demonstrates diffuse wall thickening likely due to muscular hypertrophy. A cystic lesion is seen which is contiguous with the anterior bladder dome and measures 5.5 x 3.7 cm. This is similar to prior studies and suggests a large bladder diverticulum, with other mass or abscess considered less likely. Stomach/Bowel: Diffuse colonic diverticulosis is seen, without evidence of diverticulitis. Multiple moderately dilated small bowel loops containing air-fluid levels show no significant change. Transition point is seen in the central pelvis near the presumed bladder diverticulum, with nondilated distal small bowel loops. This is consistent with a small-bowel obstruction, likely due to adhesion. No evidence of free intraperitoneal air. Vascular/Lymphatic: No pathologically enlarged lymph nodes.  No abdominal aortic aneurysm. Aortic atherosclerosis. Reproductive:  No mass or other significant abnormality. Other:  None. Musculoskeletal:  No suspicious bone lesions identified. IMPRESSION: Distal small bowel obstruction, with transition point in the central pelvis, suspicious for adhesion. Cystic lesion adjacent to anterior bladder dome  suspicious for large bladder diverticulum, given findings of chronic bladder outlet obstruction smaller diverticula on prior studies. Cystic neoplasm or abscess are considered less likely. Consider CT cystogram for further evaluation. Colonic diverticulosis, without radiographic evidence of diverticulitis. Stable large right and small left pleural effusions. Electronically Signed   By: Earle Gell M.D.   On: 10/04/2017 19:27   Dg Abd Portable 1v Result Date: 10/04/2017 CLINICAL DATA:  Constipation EXAM: PORTABLE ABDOMEN - 1 VIEW COMPARISON:  10/03/2017 FINDINGS: There are numerous dilated of small bowel measuring up to 4.6 cm. There is a small amount of air in the ascending colon. There is no evidence of pneumoperitoneum, portal venous gas or pneumatosis. There are no pathologic calcifications along the expected course of the ureters. The osseous structures are unremarkable. IMPRESSION: Numerous dilated loops of small bowel similar to the prior examination most concerning for bowel obstruction. No significant interval change. Electronically Signed   By: Kathreen Devoid   On: 10/04/2017 10:51   Scheduled Meds: . ipratropium-albuterol  3 mL Nebulization BID  . levothyroxine  25 mcg Oral QAC breakfast  . mouth rinse  15 mL Mouth Rinse BID  . pantoprazole (PROTONIX) IV  40 mg Intravenous Q12H  . potassium chloride  20 mEq Oral BID   Continuous Infusions: . sodium chloride Stopped (10/04/17 1838)  . ampicillin-sulbactam (UNASYN) IV 3 g (10/05/17 0768)  . magnesium sulfate 1 - 4 g bolus IVPB     PRN Meds:.benzonatate, MUSCLE RUB, ondansetron (ZOFRAN) IV  ASSESMENT:   *  Ileus vs SBO.  On CT looks more like obstruction, with transition point, suspicious for adhesion in SB  *   Dark emesis in setting of SBO.  FOBT +.  No recurrence but pt remains NPO. On BID IV Protonix.    *   Normocytic anemia.  Hgb stable.    *   Hypomagnesemia.  Mag sulfate IV ordered.     *   Hypothyroidism. Elevated TSH.     *  Proteus bacteremia from chronic foley.  Concern for asp PNA/HCAP. On Unasyn. Day 6 ABX.       PLAN   *  Dr Brantley Stage says "Very frail and contrast in colon.  SBO less likely but could have PSBO.  Unlikely to survive laparotomy beyond 30 days.  No clear indication for laparotomy"   *   KUB ordered for tomorrow AM.    *  Since now allowed po, will switch to PO Protonix.      Anthony Nicholson  10/05/2017, 10:00 AM Phone [phone]    Attending physician's note   I have taken an interval history, reviewed the chart and examined the patient. I agree with the Advanced Practitioner's note, impression and recommendations. Ileus vs pSBO. CT pelvic abnormality appears to be a bladder diverticulum. No recurrent emesis or bleeding. Continue current mgmt. Follow up KUB.   Lucio Edward, MD FACG [phone] office

## 2017-10-05 NOTE — Progress Notes (Signed)
[facility] Progress Note     Subjective: CC: wants to eat Denies abdominal pain or nausea. Passing some flatus, had a BM yesterday. Wanting to get out of bed this AM. Has some family from [place] coming to visit today.   Objective: Vital signs in last 24 hours: Temp:  [97.5 F (36.4 C)-98.2 F (36.8 C)] 97.5 F (36.4 C) (06/20 0514) Pulse Rate:  [67-88] 88 (06/20 0514) Resp:  [16-18] 16 (06/20 0514) BP: (101-134)/(63-75) 134/75 (06/20 0514) SpO2:  [96 %-100 %] 98 % (06/20 0514) Last BM Date: 10/03/17  Intake/Output from previous day: 06/19 0701 - 06/20 0700 In: 960.9 [IV Piggyback:960.9] Out: 1000 [Urine:1000] Intake/Output this shift: No intake/output data recorded.  PE: Gen:  Alert, NAD, pleasant Card:  Regular rate and rhythm, pedal pulses 2+ BL Pulm:  Normal effort, clear to auscultation bilaterally Abd: Soft, non-tender, moderately distended, bowel sounds hypoactive Skin: warm and dry, no rashes   Lab Results:  Recent Labs    10/03/17 0603 10/04/17 0843  WBC 9.5 10.5  HGB 9.1* 10.1*  HCT 29.8* 32.7*  PLT 204 259   BMET Recent Labs    10/04/17 0547 10/05/17 0639  NA 145 145  K 3.5 3.7  CL 117* 114*  CO2 19* 20*  GLUCOSE 79 63*  BUN 26* 17  CREATININE 1.20 1.19  CALCIUM 7.9* 8.1*   PT/INR No results for input(s): LABPROT, INR in the last 72 hours. CMP     Component Value Date/Time   NA 145 10/05/2017 0639   K 3.7 10/05/2017 0639   CL 114 (H) 10/05/2017 0639   CO2 20 (L) 10/05/2017 0639   GLUCOSE 63 (L) 10/05/2017 0639   BUN 17 10/05/2017 0639   CREATININE 1.19 10/05/2017 0639   CALCIUM 8.1 (L) 10/05/2017 0639   PROT 4.8 (L) 10/03/2017 0604   ALBUMIN 2.2 (L) 10/03/2017 0604   AST 16 10/03/2017 0604   ALT 9 (L) 10/03/2017 0604   ALKPHOS 56 10/03/2017 0604   BILITOT 0.6 10/03/2017 0604   GFRNONAA 49 (L) 10/05/2017 0639   GFRAA 57 (L) 10/05/2017 0639   Lipase  No results found for: LIPASE     Studies/Results: Dg Abd 1  View  Result Date: 10/03/2017 CLINICAL DATA:  Ileus versus small-bowel obstruction. Possible C difficile colitis. EXAM: ABDOMEN - 1 VIEW COMPARISON:  CT 10/02/2017 FINDINGS: Examination demonstrates multiple air-filled dilated small bowel loops over the central abdomen measuring up to 3.9 cm in diameter. No evidence of free peritoneal air. Air is present within the colon. There are moderate degenerative changes of the spine and mild degenerate changes of the hips. IMPRESSION: Air-filled dilated small bowel loops in the central abdomen as seen on patient's recent CT scan. Mild air within the colon. Findings are likely due to early/partial small bowel obstruction and less likely ileus. Electronically Signed   By: Marin Olp M.D.   On: 10/03/2017 19:28   Ct Abdomen Pelvis W Contrast  Result Date: 10/04/2017 CLINICAL DATA:  Abdominal distention. Small bowel obstruction. Possible pelvic mass. EXAM: CT ABDOMEN AND PELVIS WITH CONTRAST TECHNIQUE: Multidetector CT imaging of the abdomen and pelvis was performed using the standard protocol following bolus administration of intravenous contrast. CONTRAST:  162mL OMNIPAQUE IOHEXOL 300 MG/ML  SOLN COMPARISON:  10/02/2017 and 06/07/2017 FINDINGS: Lower Chest: No significant change in large right pleural effusion and small left pleural effusion. Hepatobiliary: No hepatic masses identified. Gallbladder is unremarkable. Pancreas:  No mass or inflammatory changes. Spleen: Within normal limits  in size and appearance. Adrenals/Urinary Tract: Beam hardening artifact seen from position patient arms, however no definite renal mass or hydronephrosis. The urinary bladder is nearly completely empty with Foley catheter in place, but again demonstrates diffuse wall thickening likely due to muscular hypertrophy. A cystic lesion is seen which is contiguous with the anterior bladder dome and measures 5.5 x 3.7 cm. This is similar to prior studies and suggests a large bladder  diverticulum, with other mass or abscess considered less likely. Stomach/Bowel: Diffuse colonic diverticulosis is seen, without evidence of diverticulitis. Multiple moderately dilated small bowel loops containing air-fluid levels show no significant change. Transition point is seen in the central pelvis near the presumed bladder diverticulum, with nondilated distal small bowel loops. This is consistent with a small-bowel obstruction, likely due to adhesion. No evidence of free intraperitoneal air. Vascular/Lymphatic: No pathologically enlarged lymph nodes. No abdominal aortic aneurysm. Aortic atherosclerosis. Reproductive:  No mass or other significant abnormality. Other:  None. Musculoskeletal:  No suspicious bone lesions identified. IMPRESSION: Distal small bowel obstruction, with transition point in the central pelvis, suspicious for adhesion. Cystic lesion adjacent to anterior bladder dome suspicious for large bladder diverticulum, given findings of chronic bladder outlet obstruction smaller diverticula on prior studies. Cystic neoplasm or abscess are considered less likely. Consider CT cystogram for further evaluation. Colonic diverticulosis, without radiographic evidence of diverticulitis. Stable large right and small left pleural effusions. Electronically Signed   By: Earle Gell M.D.   On: 10/04/2017 19:27   Dg Abd Portable 1v  Result Date: 10/04/2017 CLINICAL DATA:  Constipation EXAM: PORTABLE ABDOMEN - 1 VIEW COMPARISON:  10/03/2017 FINDINGS: There are numerous dilated of small bowel measuring up to 4.6 cm. There is a small amount of air in the ascending colon. There is no evidence of pneumoperitoneum, portal venous gas or pneumatosis. There are no pathologic calcifications along the expected course of the ureters. The osseous structures are unremarkable. IMPRESSION: Numerous dilated loops of small bowel similar to the prior examination most concerning for bowel obstruction. No significant interval  change. Electronically Signed   By: Kathreen Devoid   On: 10/04/2017 10:51    Anti-infectives: Anti-infectives (From admission, onward)   Start     Dose/Rate Route Frequency Ordered Stop   10/04/17 1300  Ampicillin-Sulbactam (UNASYN) 3 g in sodium chloride 0.9 % 100 mL IVPB     3 g 200 mL/hr over 30 Minutes Intravenous Every 6 hours 10/04/17 1228     10/04/17 1200  Ampicillin-Sulbactam (UNASYN) 3 g in sodium chloride 0.9 % 100 mL IVPB  Status:  Discontinued     3 g 200 mL/hr over 30 Minutes Intravenous Every 6 hours 10/04/17 0752 10/04/17 1228   10/03/17 0800  vancomycin (VANCOCIN) IVPB 1000 mg/200 mL premix  Status:  Discontinued     1,000 mg 200 mL/hr over 60 Minutes Intravenous Every 24 hours 10/03/17 0754 10/03/17 1925   10/01/17 1400  piperacillin-tazobactam (ZOSYN) IVPB 3.375 g  Status:  Discontinued     3.375 g 12.5 mL/hr over 240 Minutes Intravenous Every 8 hours 10/01/17 0816 10/04/17 0752   10/01/17 0600  vancomycin (VANCOCIN) IVPB 1000 mg/200 mL premix  Status:  Discontinued     1,000 mg 200 mL/hr over 60 Minutes Intravenous Every 48 hours 09/29/17 1531 09/29/17 2055   09/30/17 0900  piperacillin-tazobactam (ZOSYN) IVPB 2.25 g  Status:  Discontinued     2.25 g 100 mL/hr over 30 Minutes Intravenous Every 6 hours 09/30/17 0814 10/01/17 0816   09/30/17  0600  vancomycin (VANCOCIN) IVPB 1000 mg/200 mL premix  Status:  Discontinued     1,000 mg 200 mL/hr over 60 Minutes Intravenous Every 48 hours 09/30/17 0547 09/30/17 0749   09/30/17 0600  ampicillin-sulbactam (UNASYN) 1.5 g in sodium chloride 0.9 % 100 mL IVPB  Status:  Discontinued     1.5 g 200 mL/hr over 30 Minutes Intravenous Every 12 hours 09/30/17 0547 09/30/17 0749   09/29/17 2200  cefTRIAXone (ROCEPHIN) 2 g in sodium chloride 0.9 % 100 mL IVPB  Status:  Discontinued     2 g 200 mL/hr over 30 Minutes Intravenous Every 24 hours 09/29/17 2055 09/30/17 0527   09/29/17 1600  piperacillin-tazobactam (ZOSYN) IVPB 2.25 g  Status:   Discontinued     2.25 g 100 mL/hr over 30 Minutes Intravenous Every 6 hours 09/29/17 1531 09/29/17 2055   09/29/17 0445  piperacillin-tazobactam (ZOSYN) IVPB 3.375 g     3.375 g 100 mL/hr over 30 Minutes Intravenous  Once 09/29/17 0436 09/29/17 0517   09/29/17 0445  vancomycin (VANCOCIN) IVPB 1000 mg/200 mL premix     1,000 mg 200 mL/hr over 60 Minutes Intravenous  Once 09/29/17 0436 09/29/17 0551       Assessment/Plan AKI on CKD stage 3 Prostate adenocarcinoma Proteus bacteremia Possibly aspiration PNA Dysphagia C.Diff antigen positive, toxin negative - patient not having diarrhea, not sure he needs to be on enteric precautions  SBO vs ileus - KUB with persistent distention and similar seen on CT yesterday - having some bowel function and denies nausea - mobilize as able - palliative care planning family meeting Friday Hypokalemia - K 3.7, goal > 4.0 in setting of potential ileus; replace PO Hypomagnesemia - 1.6, replace IV ABL anemia/GIB - Hgb 10.1 6/19, GI consulting as well   FEN: CLD, IVF; replace lytes VTE: SCDs ID: [email or web address] 6/14; rocephin 6/14; unasyn 6/15>>    LOS: 6 days    Brigid Re , [facility] 10/05/2017, 8:13 AM Pager: [phone] Consults: [phone] Mon-Fri 7:00 am-4:30 pm Sat-Sun 7:00 am-11:30 am

## 2017-10-05 NOTE — Progress Notes (Addendum)
Subjective:  Anthony Nicholson was seen sitting comfortably in his chair this morning. He reports one non-bloody bowel movement this morning and denies any further abdominal pain, nausea or vomiting. He says he is thirsty and would like to start drinking if possible. He expressed continued interest in working with PT and would like to get more exercise if possible. He denies any shortness of breath, chest pain, lightheadedness, dysuria, or hematuria.  Objective:  Vital signs in last 24 hours: Vitals:   10/04/17 1257 10/04/17 1923 10/04/17 2100 10/05/17 0514  BP: 129/72  101/63 134/75  Pulse: 86  67 88  Resp: 18   16  Temp: (!) 97.5 F (36.4 C)  98.2 F (36.8 C) (!) 97.5 F (36.4 C)  TempSrc: Axillary  Oral Oral  SpO2: 100% 100% 96% 98%  Weight:      Height:       Weight change:   Intake/Output Summary (Last 24 hours) at 10/05/2017 0703 Last data filed at 10/05/2017 0515 Gross per 24 hour  Intake 760.89 ml  Output 1000 ml  Net -239.11 ml   GENERAL-Frailbut pleasant elderly caucasianman in no acute distress; conversant HEENT-Oral mucosa moist, no blood or exudates in the oral cavity;bilateral hearing aids in place, no cervical lymphadenopathy. CARDIAC-Normal rate, regular rhythm, no murmur. Distal pulses 2+ bilaterally. No LE edema. No JVD. Capillary refill <3 secin distal phalanges. RESP- Breathing comfortably on room air.Symmetric chest wall expansion.LCTAB.Nocrackles or rhonchi.  ABDOMEN-Bowel sounds are normoactive.Mildly distended. Mild diffuse tenderness to palpation, improving. No rebound or guarding. No hepatosplenomegaly. NEURO-Alert and oriented to self. PERRLA. EOM's intact.Moves all extremities appropriately. SKIN-Diffuse actinic keratoses, bruising, no rashes. Skin warm and dry.  Assessment/Plan:  Active Problems: Sepsis with Proteus bacteremia Possible Aspiration Pneumonia Ileus vs. SBO AcuteAnemia AKI on CKD stage 3 Prostate  adenocarcinoma Dysphagia  84 year old male with PMH of urinary obstruction from prostate cancer with chronic indwelling Foley catheter, CKD stage III, hypothyroidism, and baseline dysphagia admitted with proteus bacteremia (indwelling foley with frequent changes), aspiration pneumonia, ileus vs sbo and GIB.   Sepsis,Proteus bacteremia Possible Aspiration Pneumonia-Proteus bacteremia from indwelling foley with frequent instrumentation.Remains afebrile, mental status is at baseline. Blood culture was pansensitive. There was concern for possible aspiration PNAon initial presentation, so taking this into account with antibiotic regimen.  Repeat blood cultures from 10/03/2017 are negative. -Continue IV Unasyn (day 6 of antibiotics), plan to transition to augmentin when tolerating PO -PRN Duonebs -Strict I/O's  -Delirium precautions  Ileus vs. SBO-Had a bowel movement this morning, denies any nausea/vomiting or abdominal pain. CT abdomen/pelvis yesterday suspicious for SBO but expect this is partial and resolving given BM this morning. Palliative care met with family and will help with goals of care decisions along the way pending clinical course. Given clinical improvement, will advance diet today. -Advance to clear liquids today -Goal K+>4.0, replete to meet goal -If continued emesis or worsening distension, consider NG tube for decompression -Daily BMP -Appreciate Surgery recs -Appreciate Palliative care following  Acuteon Chronic Anemia-No additional episodes of hematemesis over past 2 days. Hgb stable at 10.1 yesterday.Has known chronic anemia and received IV iron in the past.Acute anemia likely 2/2 to GI bleed given hematemesis and may represent stress ulcer, mallory weiss tear, or malignancy.Continue to monitor closely. -IV Protonix 40 mg BID -Consider EGD when abdominal distension improves -Appreciate GI recs  AKI on CKD stage 3-Resolved. Back to baseline.Crstable at  1.19this morning. Lytes grossly stable and he is having good UOP from [employer]  catheter. Holding nephrotoxic meds as able.  Prostate adenocarcinoma- Diagnosed with prostate adenocarcinoma, Gleason 7, back in 2016 and underwent androgen deprivation therapy at the time, no surgery or radiation. Has a chronic indwelling Foley catheter for urinary retentionand replaced with a Coude catheter6/14. No hematuria or dysuria this morning and having good urine output. Will continue to follow I/Os closely.   Dysphagia- Swallow evaluation recommended continued dysphagia 2 diet with thin liquids, which he was on at ALF.  VLJ:XWKUN liquids, replete lytes prn, IV Protonix 40 mg BID VTE ppx:SCD's Code Status: DNR  Dispo:Anticipate discharge in 2-3 days pending tolerating PO.   LOS: 6 days   Anthony Nicholson, Medical Student 10/05/2017, 7:03 AM Pager: [phone] Attestation for Student Documentation:  I personally was present and performed or re-performed the history, physical exam and medical decision-making activities of this service and have verified that the service and findings are accurately documented in the student's note.  Patient feeling better, abdominal pain improving, hungry and requesting diet. Patient had witnessed bowel movement this morning. CT yesterday with bladder diverticulum with adjacent 'transition point.' He has chronic constipation and this could be related? Appreciate Engineer, site. Patient to work with PT and also ordered OT to help as well.   Anthony Gilchrest, DO 10/05/2017, 10:34 AM

## 2017-10-06 ENCOUNTER — Inpatient Hospital Stay (HOSPITAL_COMMUNITY): Payer: Medicare HMO

## 2017-10-06 LAB — CBC WITH DIFFERENTIAL/PLATELET
ABS IMMATURE GRANULOCYTES: 0.1 10*3/uL (ref 0.0–0.1)
Basophils Absolute: 0 10*3/uL (ref 0.0–0.1)
Basophils Relative: 0 %
EOS PCT: 4 %
Eosinophils Absolute: 0.4 10*3/uL (ref 0.0–0.7)
HEMATOCRIT: 33.1 % — AB (ref 39.0–52.0)
HEMOGLOBIN: 10.1 g/dL — AB (ref 13.0–17.0)
Immature Granulocytes: 1 %
LYMPHS ABS: 0.4 10*3/uL — AB (ref 0.7–4.0)
LYMPHS PCT: 4 %
MCH: 28.2 pg (ref 26.0–34.0)
MCHC: 30.5 g/dL (ref 30.0–36.0)
MCV: 92.5 fL (ref 78.0–100.0)
MONO ABS: 0.5 10*3/uL (ref 0.1–1.0)
Monocytes Relative: 5 %
NEUTROS ABS: 7.9 10*3/uL — AB (ref 1.7–7.7)
Neutrophils Relative %: 86 %
Platelets: 324 10*3/uL (ref 150–400)
RBC: 3.58 MIL/uL — ABNORMAL LOW (ref 4.22–5.81)
RDW: 16.2 % — ABNORMAL HIGH (ref 11.5–15.5)
WBC: 9.2 10*3/uL (ref 4.0–10.5)

## 2017-10-06 LAB — BASIC METABOLIC PANEL
Anion gap: 9 (ref 5–15)
BUN: 13 mg/dL (ref 6–20)
CHLORIDE: 116 mmol/L — AB (ref 101–111)
CO2: 19 mmol/L — AB (ref 22–32)
Calcium: 8 mg/dL — ABNORMAL LOW (ref 8.9–10.3)
Creatinine, Ser: 1.13 mg/dL (ref 0.61–1.24)
GFR calc Af Amer: >60 mL/min (ref >60)
GFR calc non Af Amer: 52 mL/min — ABNORMAL LOW (ref >60)
GLUCOSE: 84 mg/dL (ref 65–99)
POTASSIUM: 4 mmol/L (ref 3.5–5.1)
Sodium: 144 mmol/L (ref 135–145)

## 2017-10-06 MED ORDER — AMOXICILLIN-POT CLAVULANATE 875-125 MG PO TABS
1.0000 | ORAL_TABLET | Freq: Two times a day (BID) | ORAL | Status: DC
  Administered 2017-10-06 – 2017-10-08 (×5): 1 via ORAL
  Filled 2017-10-06 (×5): qty 1

## 2017-10-06 MED ORDER — DOCUSATE SODIUM 100 MG PO CAPS
100.0000 mg | ORAL_CAPSULE | Freq: Two times a day (BID) | ORAL | Status: DC
  Administered 2017-10-06 – 2017-10-07 (×3): 100 mg via ORAL
  Filled 2017-10-06 (×3): qty 1

## 2017-10-06 MED ORDER — ENSURE ENLIVE PO LIQD
237.0000 mL | Freq: Two times a day (BID) | ORAL | Status: DC
  Administered 2017-10-06 – 2017-10-08 (×4): 237 mL via ORAL

## 2017-10-06 MED ORDER — POLYETHYLENE GLYCOL 3350 17 G PO PACK
17.0000 g | PACK | Freq: Every day | ORAL | Status: DC | PRN
  Administered 2017-10-06: 17 g via ORAL

## 2017-10-06 MED ORDER — POLYETHYLENE GLYCOL 3350 17 G PO PACK
17.0000 g | PACK | Freq: Every day | ORAL | Status: DC
  Filled 2017-10-06: qty 1

## 2017-10-06 NOTE — Progress Notes (Signed)
OT Cancellation Note  Patient Details Name: Anthony Nicholson MRN: [id number] DOB: 1918/02/11   Cancelled Treatment:    Reason Eval/Treat Not Completed: Fatigue/lethargy limiting ability to participate; pt supine in bed, significantly fatigued and with difficulty keeping his eyes open, reports he "feels terrible" and requesting to rest. Will follow up as schedule permits for OT eval.  Lou Cal, OT Pager [phone] 10/06/2017   Anthony Nicholson 10/06/2017, 1:40 PM

## 2017-10-06 NOTE — Progress Notes (Signed)
Physical Therapy Treatment Patient Details Name: Anthony Nicholson MRN: [id number] DOB: 10-31-1917 Today's Date: 10/06/2017    History of Present Illness Pt is a 84 y/o [employer] veteran with a PMH significant for urinary obstruction from prostate cancer with chronic indwelling foley catheter, CKD stage III, hypothyroidism, and baseline dysphagia who presents with suprapubic tenderness, hemoptysis, fever, AMS, leukocytosis, and elevated lactic acid concerning for urosepsis and questionable aspiration PNA.     PT Comments    Pt up in chair on arrival and fatigued from sitting up but agreeable and wanting to try walking with therapy. Pt's with mumbled speech making communication very difficult, however pt seems to follow commands with consistency. He required max +2 to rise into standing, bil knees buckling and trunk flexed during both attempts to stand. Pt able to tolerate partial standing for ~1 min before requiring seated rest break. Pt may benefit from use of stedy on next visit as he was very unsteady attempting to stand with RW and would benefit from knee blocking assist provided by stedy. Unable to walk at this time. Will continue to follow acutely to maximize functional independence and activity tolerance.    Follow Up Recommendations  SNF;Supervision/Assistance - 24 hour     Equipment Recommendations  None recommended by PT    Recommendations for Other Services       Precautions / Restrictions Precautions Precautions: Fall Restrictions Weight Bearing Restrictions: No    Mobility  Bed Mobility               General bed mobility comments: up in chair on arrival  Transfers Overall transfer level: Needs assistance Equipment used: Rolling walker (2 wheeled) Transfers: Sit to/from Stand Sit to Stand: Max assist;+2 physical assistance         General transfer comment: Pt required max A +2 to rise into standing from seated position. On first attempt pt unable to come fully  upright, with back flexed and BIL knees buckling. Attempted blocking knees on second attempt, however pt still unable to come fully upright, Very unsteady and shakey with RW.  Ambulation/Gait             General Gait Details: unable   Stairs             Wheelchair Mobility    Modified Rankin (Stroke Patients Only)       Balance Overall balance assessment: Needs assistance Sitting-balance support: Feet supported;Bilateral upper extremity supported Sitting balance-Leahy Scale: Poor   Postural control: Posterior lean Standing balance support: Bilateral upper extremity supported;During functional activity Standing balance-Leahy Scale: Zero Standing balance comment: Max A required to stand. Able to tolerate standing with max A for ~1 min each time                            Cognition Arousal/Alertness: Awake/alert Behavior During Therapy: WFL for tasks assessed/performed Overall Cognitive Status: Difficult to assess                                 General Comments: Pt with unintelligable speech. Was only able to understand a few words. When asked pt yes or no questions, pt would attempt lengthy answer making communication difficult.      Exercises      General Comments        Pertinent Vitals/Pain Pain Assessment: Faces Faces Pain Scale: No hurt  Home Living                      Prior Function            PT Goals (current goals can now be found in the care plan section) Acute Rehab PT Goals Patient Stated Goal: Walk more with therapy PT Goal Formulation: With patient Time For Goal Achievement: 10/07/17 Potential to Achieve Goals: Good Progress towards PT goals: Not progressing toward goals - comment(pt fatigued and weak limiting progress.)    Frequency    Min 2X/week      PT Plan Current plan remains appropriate    Co-evaluation              AM-PAC PT "6 Clicks" Daily Activity  Outcome Measure   Difficulty turning over in bed (including adjusting bedclothes, sheets and blankets)?: Unable Difficulty moving from lying on back to sitting on the side of the bed? : Unable Difficulty sitting down on and standing up from a chair with arms (e.g., wheelchair, bedside commode, etc,.)?: Unable Help needed moving to and from a bed to chair (including a wheelchair)?: A Lot Help needed walking in hospital room?: Total Help needed climbing 3-5 steps with a railing? : Total 6 Click Score: 7    End of Session Equipment Utilized During Treatment: Gait belt Activity Tolerance: Patient limited by fatigue Patient left: in chair;with call bell/phone within reach;with chair alarm set(telasys, posy belt) Nurse Communication: Mobility status PT Visit Diagnosis: Unsteadiness on feet (R26.81);Other abnormalities of gait and mobility (R26.89);Muscle weakness (generalized) (M62.81)     Time: 1100-1120 PT Time Calculation (min) (ACUTE ONLY): 20 min  Charges:  $Therapeutic Activity: 8-22 mins                    G Codes:      Benjiman Core, [place] Pager [phone] Acute Rehab  Allena Katz 10/06/2017, 11:30 AM

## 2017-10-06 NOTE — Progress Notes (Signed)
Palliative Care Progress Note  Reason for consult: Goals of care  Mr. Razzano was tired and asked that I meet with his family separately to discuss care plans.  I met with patient's daughter and son in law.  We reviewed his clinical course both this admission over the past several months.  His family has noted continued decline in his nutrition, cognition, and functional status for the last several years.  They are clear that the goal overall is to ensure that he has the best quality of life possible while avoiding aggressive interventions.    After discussion they have decided that they want to forego any endoscopy at this point in time as it appears his hemoglobin is stable.    Overall their goal is to work to transition him back to the assisted living facility where he has been staying ([facility]).   They do think he would benefit from a short period of rehab (at [facility]) prior to transitioning back to assisted living and are hopeful that he will regain enough strength to transition back to assisted living.  He is currently receives a great deal of support from his veterans benefits and if he is able to transition back to assisted living within the next few weeks these are things that he will be able to maintain.   We reviewed a MOST form and discussed how to develop plan of care to focus on continuing therapies that would maximize chance of being well enough to return home and limiting therapies not in line with this goal.  - We completed MOST form today. DNR, Limited additional interventions, IVF and ABX if indicated. - Family is not wanting to pursue invasive workup (EGD) at this point. - Would like to have him transition back to [facility] for rehab followed by transition back to [facility] assisted living based upon his clinical course over the next couple of weeks.  Total time: 55 minutes Greater than 50%  of this time was spent counseling and coordinating care related to the  above assessment and plan.  Micheline Rough, MD [place] Team [phone]

## 2017-10-06 NOTE — Clinical Social Work Note (Signed)
Clinical Social Work Assessment  Patient Details  Name: Anthony Nicholson MRN: [id number] Date of Birth: 04/15/1918  Date of referral:  10/06/17               Reason for consult:  Facility Placement, Discharge Planning                Permission sought to share information with:  Facility Sport and exercise psychologist, Family Supports Permission granted to share information::  No(pt only oriented to self )  Name::     Anthony Nicholson and Louann Sjogren  Agency::  [facility]  Relationship::  daughter and son in Financial trader Information:     Housing/Transportation Living arrangements for the past 2 months:  [place] of Information:  Adult Children Patient Interpreter Needed:  None Criminal Activity/Legal Involvement Pertinent to Current Situation/Hospitalization:  No - Comment as needed Significant Relationships:  Adult Children, Warehouse manager, Other Family Members Lives with:  Facility Resident Do you feel safe going back to the place where you live?  Yes Need for family participation in patient care:  Yes (Comment)  Care giving concerns:  Pt is of advanced age (79), has cognitive deficits and has been ill recently with UTIs and other issues. Pt family state that they are aware of his age and challenges that it poses.    Social Worker assessment / plan:  CSW called pt daughter Anthony Nicholson, pt son in law [place] picked up. Per Lucianne Lei they have had conversation with attending medical team, palliative and Janie, admissions liaison at [facility]. Paperwork complete for pt to discharge to SNF. Pt family aware sitter would have to be d/c for 24 hours prior to discharge.   Employment status:  Retired Nurse, adult, [place] PT Recommendations:  [place], [place] / Referral to community resources:  [place]  Patient/Family's Response to care: Pt family amenable to discharge back to SNF at [facility],  amenable to [place].   Patient/Family's Understanding of and Emotional Response to Diagnosis, Current Treatment, and Prognosis:  Pt family state understanding of diagnosis, current treatment and are extremely realistic about pt's trajectory at an advanced age. They are able to celebrate all the time they have had with pt and are positive about supporting pt with anything he needs.   Emotional Assessment Appearance:  Appears stated age Attitude/Demeanor/Rapport:  Gracious Affect (typically observed):  Accepting, Appropriate Orientation:  Oriented to Self, Fluctuating Orientation (Suspected and/or reported Sundowners) Alcohol / Substance use:  Tobacco Use(quit smoking in the 1970s) Psych involvement (Current and /or in the community):  No (Comment)  Discharge Needs  Concerns to be addressed:  Care Coordination, Discharge Planning Concerns Readmission within the last 30 days:  No Current discharge risk:  Dependent with Mobility, Physical Impairment, Cognitively Impaired Barriers to Discharge:  Ship broker, Continued Medical Work up   [employer], [place] 10/06/2017, 4:28 PM

## 2017-10-06 NOTE — Progress Notes (Signed)
  Speech Language Pathology Treatment: Dysphagia  Patient Details Name: Anthony Nicholson MRN: [id number] DOB: 07/02/1917 Today's Date: 10/06/2017 Time: 9371-6967 SLP Time Calculation (min) (ACUTE ONLY): 17 min  Assessment / Plan / Recommendation Clinical Impression  Skilled observation/differential dx with intake of thin liquids with varying volumes and puree (full liquids) with intermittent aphonia noted (prior to intake vocal quality decreased also) and multiple swallows with initial swallows of thin liquid, but subsequent swallows he did not require multiple swallows; initial BSE did not indicate any s/s of aspiration, but new vocal aphonic quality paired with CXR indicating RLL PNA may indicate possibility of aspiration and/or esophageal dysphagia; recommend ST f/u for diet tolerance and re-assessment of swallowing function to determine if objective testing is warranted and pt is able to complete with medical dx; continue clear liquids utilizing slow rate and small bites/sips; ST will continue to f/u in acute setting.  HPI HPI: Anthony Nicholson is a 84 year old man with a history of prostate cancer and urinary retention with indwelling Foley catheter, chronic kidney disease stage III, hypothyroidism, and dysphagia who presented to the ED from [facility]F where he was reported to have hemoptysis and dyspnea with decreased oxygen saturation to 80 to 90%.  During the previous day at his facility he developed the urinary complaints and was found to have an obstructed/nonfunctioning Foley catheter which had to be exchanged last night to resume urine output.  His family who was present earlier this morning states his current confusion is different from baseline and they believesimilar to previous mental status changes when suffering UTIs in the past.  Due to his confusion I am unable to get a accurate course of events at his facility from the patient during my interview.  At my exam his primary complaint is of  ongoing suprapubic and penile pain. He also complains of constipation with no bowel movements in the past few days despite straining with attempts.  Most recent CXR is showing patchin RLL opacity with small pleural effusion suspicious for PNA.        SLP Plan  Continue with current plan of care       Recommendations  Diet recommendations: Thin liquid Liquids provided via: Cup;Straw Medication Administration: Whole meds with liquid Supervision: Staff to assist with self feeding;Intermittent supervision to cue for compensatory strategies Compensations: Slow rate;Small sips/bites Postural Changes and/or Swallow Maneuvers: Seated upright 90 degrees                Oral Care Recommendations: Oral care BID Follow up Recommendations: Other (comment) SLP Visit Diagnosis: Dysphagia, unspecified (R13.10) Plan: Continue with current plan of care                      Elvina Sidle, M.S., CCC-SLP 10/06/2017, 5:11 PM

## 2017-10-06 NOTE — Evaluation (Addendum)
Occupational Therapy Evaluation Patient Details Name: Anthony Nicholson MRN: [id number] DOB: 1917/06/15 Today's Date: 10/06/2017    History of Present Illness Pt is a 84 y/o WWII veteran with a PMH significant for urinary obstruction from prostate cancer with chronic indwelling foley catheter, CKD stage III, hypothyroidism, and baseline dysphagia who presents with suprapubic tenderness, hemoptysis, fever, AMS, leukocytosis, and elevated lactic acid concerning for urosepsis and questionable aspiration PNA.    Clinical Impression   This 84 y/o male presents with the above. At baseline pt resides at [facility], per daughter report pt was ambulating with rollator, receiving assist for bathing/dressing ADLs. Pt with very hoarse voice and mumbled speech this session increasing pt's difficulty to communicate despite pt's best attempts. Pt presenting with generalized weakness and decreased activity tolerance. Pt requiring modA+2 for bed mobility and for sit<>stand at [facility] with use of Stedy for transfer to recliner. Pt currently requires Max-totalA for LB and toileting ADLs, minA for UB ADLs. Pt will benefit from continued acute OT services and recommend follow up OT services in SNF setting after discharge to maximize his overall strength, endurance, safety and independence with ADLs and mobility.     Follow Up Recommendations  SNF;Supervision/Assistance - 24 hour    Equipment Recommendations  Other (comment)(TBD in next venue )           Precautions / Restrictions Precautions Precautions: Fall Restrictions Weight Bearing Restrictions: No      Mobility Bed Mobility Overal bed mobility: Needs Assistance Bed Mobility: Supine to Sit     Supine to sit: Mod assist;+2 for physical assistance;+2 for safety/equipment     General bed mobility comments: pt initiates bringing LEs over EOB, assist to elevate trunk and scoot hips towards EOB   Transfers Overall transfer level: Needs  assistance Equipment used: Ambulation equipment used Transfers: Sit to/from Stand Sit to Stand: Mod assist;+2 physical assistance;+2 safety/equipment         General transfer comment: ModA for sit<>Stand at [facility] from EOB with multimodal cues for hand placement and assist to rise and steady in standing, use of Stedy for transfer EOB to recliner     Balance Overall balance assessment: Needs assistance Sitting-balance support: Feet supported;Bilateral upper extremity supported Sitting balance-Leahy Scale: Poor     Standing balance support: Bilateral upper extremity supported;During functional activity Standing balance-Leahy Scale: Poor                             ADL either performed or assessed with clinical judgement   ADL Overall ADL's : Needs assistance/impaired Eating/Feeding: Supervision/ safety;Sitting;Set up   Grooming: Set up;Supervision/safety;Sitting   Upper Body Bathing: Minimal assistance;Sitting   Lower Body Bathing: Moderate assistance;+2 for physical assistance;+2 for safety/equipment;Sit to/from stand   Upper Body Dressing : Minimal assistance;Sitting   Lower Body Dressing: Moderate assistance;+2 for physical assistance;+2 for safety/equipment;Sit to/from stand       Toileting- Water quality scientist and Hygiene: Maximal assistance;+2 for physical assistance;+2 for safety/equipment;Sit to/from stand       Functional mobility during ADLs: Moderate assistance;+2 for physical assistance;+2 for safety/equipment(use of Stedy this session ) General ADL Comments: assisted with transfer bed to recliner with use of Stedy and +2 ModA for sit<>stand; pt appearing very fatigued but is agreeable to [employer]         Perception     Praxis      Pertinent Vitals/Pain Pain Assessment: Faces Faces Pain Scale:  Hurts a little bit Pain Location: RUE at IV site  Pain Descriptors / Indicators: Sore Pain Intervention(s): Monitored during session      Hand Dominance Right   Extremity/Trunk Assessment Upper Extremity Assessment Upper Extremity Assessment: LUE deficits/detail;Generalized weakness LUE Deficits / Details: per PT eval pt with baseline shoulder pain, and evident today as pt with decreased AROM; weakness in bil UEs, weak grip strength    Lower Extremity Assessment Lower Extremity Assessment: Defer to PT evaluation   Cervical / Trunk Assessment Cervical / Trunk Assessment: Kyphotic   Communication Communication Communication: HOH;Other (comment)(pt with very hoarse voice, mumbled speech )   Cognition Arousal/Alertness: Awake/alert Behavior During Therapy: WFL for tasks assessed/performed Overall Cognitive Status: Difficult to assess                                 General Comments: Pt with unintelligable speech and very hoarse speech. Was only able to understand a few words. When asked pt yes or no questions, pt would attempt lengthy answer making communication difficult. Overall pt following one step commands consistently during session    General Comments       Exercises General Exercises - Upper Extremity Elbow Flexion: AAROM;5 reps;Both;Seated Elbow Extension: AAROM;5 reps;Both;Seated General Exercises - Lower Extremity Ankle Circles/Pumps: AROM;Both;Seated;10 reps   Shoulder Instructions      Home Living Family/patient expects to be discharged to:: Assisted living                             Home Equipment: Walker - 4 wheels;Shower seat   Additional Comments: Per chart review, pt resides at [facility], per daughter had just recently progressed to ALF side from SNF       Prior Functioning/Environment Level of Independence: Needs assistance  Gait / Transfers Assistance Needed: per daughter pt ambulated with a rollator PTA ADL's / Homemaking Assistance Needed: Assist for bathing, dressing from staff    Comments: daughter reports prior to this admission pt was ambulating  in the hallway with rollator         OT Problem List: Decreased strength;Impaired balance (sitting and/or standing);Decreased activity tolerance      OT Treatment/Interventions: Self-care/ADL training;DME and/or AE instruction;Balance training;Therapeutic activities;Therapeutic exercise;Patient/family education    OT Goals(Current goals can be found in the care plan section) Acute Rehab OT Goals Patient Stated Goal: per daughter, to regain prior level of independence OT Goal Formulation: With patient/family Time For Goal Achievement: 10/20/17 Potential to Achieve Goals: Fair  OT Frequency: Min 2X/week   Barriers to D/C:            Co-evaluation              AM-PAC PT "6 Clicks" Daily Activity     Outcome Measure Help from another person eating meals?: A Little Help from another person taking care of personal grooming?: A Little Help from another person toileting, which includes using toliet, bedpan, or urinal?: Total Help from another person bathing (including washing, rinsing, drying)?: A Lot Help from another person to put on and taking off regular upper body clothing?: A Lot Help from another person to put on and taking off regular lower body clothing?: Total 6 Click Score: 12   End of Session Equipment Utilized During Treatment: Other (comment)(Stedy ) Nurse Communication: Mobility status  Activity Tolerance: Patient tolerated treatment well;Patient limited by fatigue Patient left: in chair;with  call bell/phone within reach;with family/visitor present  OT Visit Diagnosis: Muscle weakness (generalized) (M62.81)                Time: 4961-1643 OT Time Calculation (min): 27 min Charges:  OT General Charges $OT Visit: 1 Visit OT Evaluation $OT Eval Moderate Complexity: 1 Mod OT Treatments $Self Care/Home Management : 8-22 mins G-Codes:     Lou Cal, OT Pager [phone] 10/06/2017   Raymondo Band 10/06/2017, 4:22 PM

## 2017-10-06 NOTE — Progress Notes (Signed)
Subjective:  Anthony Nicholson was seen lying comfortably in bed this morning. Family was not at the bedside. He reports one bowel movement overnight. Denies any further nausea, vomiting, abdominal pain, or dysuria. He says that he feels hoarse today. He denies any cough, shortness of breath, or dysphagia. He tolerated a clear liquid diet yesterday without difficulty and would like to advance if possible.   Objective:  Vital signs in last 24 hours: Vitals:   10/05/17 1311 10/05/17 1923 10/05/17 2143 10/06/17 0552  BP: 135/88  137/66 (!) 144/75  Pulse: 98  83 (!) 101  Resp:   15 16  Temp: (!) 97.3 F (36.3 C)  97.9 F (36.6 C) 98.2 F (36.8 C)  TempSrc: Oral  Axillary Axillary  SpO2: 98% 99% 100% 100%  Weight:      Height:       Weight change:   Intake/Output Summary (Last 24 hours) at 10/06/2017 0634 Last data filed at 10/06/2017 0557 Gross per 24 hour  Intake 510.74 ml  Output 600 ml  Net -89.26 ml   GENERAL-Frailbut pleasant elderly caucasianman in no acute distress. Voice is hoarse. HEENT-Oral mucosa moist, some dried blood on the posterior tongue;bilateral hearing aids in place, no cervical lymphadenopathy. No thrush or pharyngeal exudates. CARDIAC-Normal rate, regular rhythm, no murmur. Distal pulses 2+ bilaterally. No LE edema. No JVD. Capillary refill <3 secin distal phalanges. RESP- Breathing comfortably on [place].Symmetric chest wall expansion.LCTAB.Nocrackles or rhonchi. No stridor or wheezing.  ABDOMEN-Bowel sounds are normoactive.Mildly distended.No tenderness to palpation. No rebound or guarding.No hepatosplenomegaly. NEURO-Alertand oriented to self.PERRLA. EOM's intact.Moves all extremities appropriately. SKIN-Diffuse actinic keratoses, bruising, no rashes. Skin warm and dry.   Assessment/Plan:  Active Problems: Sepsis with Proteus bacteremia Possible Aspiration Pneumonia Ileus vs. SBO AcuteAnemia Hoarseness Prostate  adenocarcinoma Dysphagia  84 year old male with PMH of urinary obstruction from prostate cancer with chronic indwelling Foley catheter, CKD stage III, hypothyroidism, and baseline dysphagiaadmitted with proteus bacteremia (indwelling foley with frequent changes), aspiration pneumonia, ileus vs sbo and GIB.  Sepsis,Proteus bacteremia Possible Aspiration Pneumonia-Proteus bacteremia from indwelling foley with frequent instrumentation.Remains afebrile, mental status is at baseline. Blood culturewas pansensitive. There was concern for possible aspiration PNAon initial presentation, so taking this into account with antibiotic regimen. Repeat blood cultures from 10/03/2017 are negative. -Switch to PO Augmentin (day 9 of antibiotics) -PRN Duonebs -Strict I/O's -Delirium precautions  Ileus vs. SBO- CT abdomen/pelvis on 6/19 showed SBO with possible transition point but patient had 2 bowel movement this yesterday, and denies any nausea/vomiting or abdominal pain. Suspect partial SBO which is resolving. Started on clear liquid diet yesterday and tolerating well -Advance to soft food diet today pending Surgery recs -Goal K+>4.0, replete to meet goal -If continued emesis or worsening distension, consider NG tube for decompression -Daily BMP -Appreciate Surgery recs -Appreciate Palliative care following  Acuteon Chronic Anemia-Noadditional episodes of hematemesis over past 4 days.Hgb stable at 10.1.Has known chronic anemia and received IV iron in the past.Acute anemia was likely 2/2 to GI bleed given hematemesis and may represent stress ulcer, mallory weiss tear, or malignancy.Continue to monitor closely. -IV Protonix 40 mg BID -Appreciate GI recs  Hoarseness- New overnight. No stridor or wheezing, no thrush on exam. Patient is breathing comfortably on [place]@ 2 lpm. Suspect acute laryngitis 2/2 to upper respiratory infection or small aspiration events and will continue to monitor.    Prostate adenocarcinoma- Diagnosed with prostate adenocarcinoma, Gleason 7, back in 2016 and underwent androgen deprivation therapy at the time,  no surgery or radiation. Has a chronic indwelling Foley catheter for urinary retentionand replaced with a Coude catheter6/14. No hematuria or dysuria this morning and having good urine output. Will continue to follow I/Os closely.   Dysphagia- Swallow evaluation recommended continued dysphagia 2 diet with thin liquids, which he was on at ALF.  IBB:CWUGQBV to soft diet pending GI recs, replete lytes prn, IV Protonix 40 mg BID VTE ppx:SCD's Code Status: DNR  Dispo:Anticipate discharge in 1-2 days pending tolerating PO.    LOS: 7 days   Thornton Papas, Medical Student 10/06/2017, 6:34 AM Pager: [phone]

## 2017-10-06 NOTE — Social Work (Signed)
CSW acknowledging consult for placement, pt from ALF at [facility], has bed available through SNF at [facility]. Will continue to follow and reach out to pt daughter.  Of note pt still has telesitter, SNF and ALF will have to have that discontinued for 24 hours in order to admit pt back to [facility]. Await PMT recommendations, aware there was a meeting this morning with family per GI note.  Alexander [place], [place] Work [phone]

## 2017-10-06 NOTE — Progress Notes (Addendum)
Daily Rounding Note  10/06/2017, 10:53 AM  LOS: 7 days   SUBJECTIVE:   More stools, runny, black last night and today.  Overall feels better.  Tolerating clear liquids.   OBJECTIVE:         Vital signs in last 24 hours:    Temp:  [97.3 F (36.3 C)-98.2 F (36.8 C)] 98.2 F (36.8 C) (06/21 0552) Pulse Rate:  [83-101] 101 (06/21 0552) Resp:  [15-16] 16 (06/21 0552) BP: (135-144)/(66-88) 144/75 (06/21 0552) SpO2:  [98 %-100 %] 100 % (06/21 0552) Last BM Date: 10/05/17 Filed Weights   09/29/17 0412  Weight: 136 lb (61.7 kg)   General: looks ill and weak.   Heart:  Slight tachy, regular Chest: hoarse voice.  Decreased BS.   Abdomen: less tense, not tender.  Quiet.    Extremities: no CCE Neuro/Psych:  Alert, follows commands  Intake/Output from previous day: 06/20 0701 - 06/21 0700 In: 410.7 [P.O.:300; IV Piggyback:110.7] Out: 600 [Urine:600]  Intake/Output this shift: No intake/output data recorded.  Lab Results: Recent Labs    10/04/17 0843 10/06/17 0647  WBC 10.5 9.2  HGB 10.1* 10.1*  HCT 32.7* 33.1*  PLT 259 324   BMET Recent Labs    10/04/17 0547 10/05/17 0639 10/06/17 0647  NA 145 145 144  K 3.5 3.7 4.0  CL 117* 114* 116*  CO2 19* 20* 19*  GLUCOSE 79 63* 84  BUN 26* 17 13  CREATININE 1.20 1.19 1.13  CALCIUM 7.9* 8.1* 8.0*    Studies/Results: Ct Abdomen Pelvis W Contrast  Result Date: 10/04/2017 CLINICAL DATA:  Abdominal distention. Small bowel obstruction. Possible pelvic mass. EXAM: CT ABDOMEN AND PELVIS WITH CONTRAST TECHNIQUE: Multidetector CT imaging of the abdomen and pelvis was performed using the standard protocol following bolus administration of intravenous contrast. CONTRAST:  175mL OMNIPAQUE IOHEXOL 300 MG/ML  SOLN COMPARISON:  10/02/2017 and 06/07/2017 FINDINGS: Lower Chest: No significant change in large right pleural effusion and small left pleural effusion. Hepatobiliary:  No hepatic masses identified. Gallbladder is unremarkable. Pancreas:  No mass or inflammatory changes. Spleen: Within normal limits in size and appearance. Adrenals/Urinary Tract: Beam hardening artifact seen from position patient arms, however no definite renal mass or hydronephrosis. The urinary bladder is nearly completely empty with Foley catheter in place, but again demonstrates diffuse wall thickening likely due to muscular hypertrophy. A cystic lesion is seen which is contiguous with the anterior bladder dome and measures 5.5 x 3.7 cm. This is similar to prior studies and suggests a large bladder diverticulum, with other mass or abscess considered less likely. Stomach/Bowel: Diffuse colonic diverticulosis is seen, without evidence of diverticulitis. Multiple moderately dilated small bowel loops containing air-fluid levels show no significant change. Transition point is seen in the central pelvis near the presumed bladder diverticulum, with nondilated distal small bowel loops. This is consistent with a small-bowel obstruction, likely due to adhesion. No evidence of free intraperitoneal air. Vascular/Lymphatic: No pathologically enlarged lymph nodes. No abdominal aortic aneurysm. Aortic atherosclerosis. Reproductive:  No mass or other significant abnormality. Other:  None. Musculoskeletal:  No suspicious bone lesions identified. IMPRESSION: Distal small bowel obstruction, with transition point in the central pelvis, suspicious for adhesion. Cystic lesion adjacent to anterior bladder dome suspicious for large bladder diverticulum, given findings of chronic bladder outlet obstruction smaller diverticula on prior studies. Cystic neoplasm or abscess are considered less likely. Consider CT cystogram for further evaluation. Colonic diverticulosis, without radiographic evidence of diverticulitis.  Stable large right and small left pleural effusions. Electronically Signed   By: Earle Gell M.D.   On: 10/04/2017 19:27    Dg Abd Portable 1v  Result Date: 10/06/2017 CLINICAL DATA:  Follow up small bowel dilatation EXAM: PORTABLE ABDOMEN - 1 VIEW COMPARISON:  None. FINDINGS: Scattered mildly dilated loops of small bowel are again identified roughly stable from the previous exam. Previously administered contrast now lies within the colon on the right indicating a likely partial small bowel obstruction. No free air is noted. Degenerative changes of lumbar spine are seen. IMPRESSION: Persistent small bowel dilatation likely related to a partial small bowel obstruction. Electronically Signed   By: Inez Catalina M.D.   On: 10/06/2017 07:02   Scheduled Meds: . amoxicillin-clavulanate  1 tablet Oral Q12H  . docusate sodium  100 mg Oral BID  . feeding supplement (ENSURE ENLIVE)  237 mL Oral BID BM  . ipratropium-albuterol  3 mL Nebulization BID  . levothyroxine  25 mcg Oral QAC breakfast  . mouth rinse  15 mL Mouth Rinse BID  . pantoprazole  40 mg Oral Q0600   Continuous Infusions: . sodium chloride 10 mL/hr at 10/05/17 1735   PRN Meds:.benzonatate, MUSCLE RUB, ondansetron (ZOFRAN) IV, polyethylene glycol  ASSESMENT:   *    PSBO versus ileus.  Small bowel dilatation persists per KUB today but has bowel function and tolerating clear liquids.  *     Hypomagnesemia.  Received IV mag yesterday, not rechecked today.  *    Hypothyroidism.  TSH elevated.  *    Proteus bacteremia in patient with chronic Foley.  *    Suspicion for aspiration pneumonia.  Unasyn >> augmentin.    *     Stable, normocytic anemia.  FOBT positive, stools are dark.  C. difficile antigen positive but toxin negative.   PLAN   *    Continue clear liquids for now.  Take cues from general surgery as to timing of advancing his diet.   Anthony Nicholson  10/06/2017, 10:53 AM Phone [phone]     Attending physician's note   I have taken an interval history, reviewed the chart and examined the patient. I agree with the Advanced  Practitioner's note, impression and recommendations.   Lucio Edward, MD FACG [phone] office

## 2017-10-06 NOTE — Progress Notes (Signed)
[facility] Progress Note     Subjective: CC: hoarse Voice is hoarse today. Denies abdominal pain, nausea. Tolerating CLD and having bowel function. Had some loose stools last night and this AM. Feels better overall. Palliative care speaking with patient and family this AM.   Objective: Vital signs in last 24 hours: Temp:  [97.3 F (36.3 C)-98.2 F (36.8 C)] 98.2 F (36.8 C) (06/21 0552) Pulse Rate:  [83-101] 101 (06/21 0552) Resp:  [15-16] 16 (06/21 0552) BP: (135-144)/(66-88) 144/75 (06/21 0552) SpO2:  [98 %-100 %] 100 % (06/21 0552) Last BM Date: 10/05/17  Intake/Output from previous day: 06/20 0701 - 06/21 0700 In: 410.7 [P.O.:300; IV Piggyback:110.7] Out: 600 [Urine:600] Intake/Output this shift: No intake/output data recorded.  PE: Gen:  Alert, NAD, pleasant Card:  Regular rate and rhythm, pedal pulses 2+ BL Pulm:  Normal effort, clear to auscultation bilaterally Abd: Soft, non-tender, distended, bowel sounds hypoactive Skin: warm and dry, no rashes    Lab Results:  Recent Labs    10/04/17 0843 10/06/17 0647  WBC 10.5 9.2  HGB 10.1* 10.1*  HCT 32.7* 33.1*  PLT 259 324   BMET Recent Labs    10/05/17 0639 10/06/17 0647  NA 145 144  K 3.7 4.0  CL 114* 116*  CO2 20* 19*  GLUCOSE 63* 84  BUN 17 13  CREATININE 1.19 1.13  CALCIUM 8.1* 8.0*   PT/INR No results for input(s): LABPROT, INR in the last 72 hours. CMP     Component Value Date/Time   NA 144 10/06/2017 0647   K 4.0 10/06/2017 0647   CL 116 (H) 10/06/2017 0647   CO2 19 (L) 10/06/2017 0647   GLUCOSE 84 10/06/2017 0647   BUN 13 10/06/2017 0647   CREATININE 1.13 10/06/2017 0647   CALCIUM 8.0 (L) 10/06/2017 0647   PROT 4.8 (L) 10/03/2017 0604   ALBUMIN 2.2 (L) 10/03/2017 0604   AST 16 10/03/2017 0604   ALT 9 (L) 10/03/2017 0604   ALKPHOS 56 10/03/2017 0604   BILITOT 0.6 10/03/2017 0604   GFRNONAA 52 (L) 10/06/2017 0647   GFRAA >60 10/06/2017 0647   Lipase  No results  found for: LIPASE     Studies/Results: Ct Abdomen Pelvis W Contrast  Result Date: 10/04/2017 CLINICAL DATA:  Abdominal distention. Small bowel obstruction. Possible pelvic mass. EXAM: CT ABDOMEN AND PELVIS WITH CONTRAST TECHNIQUE: Multidetector CT imaging of the abdomen and pelvis was performed using the standard protocol following bolus administration of intravenous contrast. CONTRAST:  197mL OMNIPAQUE IOHEXOL 300 MG/ML  SOLN COMPARISON:  10/02/2017 and 06/07/2017 FINDINGS: Lower Chest: No significant change in large right pleural effusion and small left pleural effusion. Hepatobiliary: No hepatic masses identified. Gallbladder is unremarkable. Pancreas:  No mass or inflammatory changes. Spleen: Within normal limits in size and appearance. Adrenals/Urinary Tract: Beam hardening artifact seen from position patient arms, however no definite renal mass or hydronephrosis. The urinary bladder is nearly completely empty with Foley catheter in place, but again demonstrates diffuse wall thickening likely due to muscular hypertrophy. A cystic lesion is seen which is contiguous with the anterior bladder dome and measures 5.5 x 3.7 cm. This is similar to prior studies and suggests a large bladder diverticulum, with other mass or abscess considered less likely. Stomach/Bowel: Diffuse colonic diverticulosis is seen, without evidence of diverticulitis. Multiple moderately dilated small bowel loops containing air-fluid levels show no significant change. Transition point is seen in the central pelvis near the presumed bladder diverticulum, with nondilated distal small  bowel loops. This is consistent with a small-bowel obstruction, likely due to adhesion. No evidence of free intraperitoneal air. Vascular/Lymphatic: No pathologically enlarged lymph nodes. No abdominal aortic aneurysm. Aortic atherosclerosis. Reproductive:  No mass or other significant abnormality. Other:  None. Musculoskeletal:  No suspicious bone lesions  identified. IMPRESSION: Distal small bowel obstruction, with transition point in the central pelvis, suspicious for adhesion. Cystic lesion adjacent to anterior bladder dome suspicious for large bladder diverticulum, given findings of chronic bladder outlet obstruction smaller diverticula on prior studies. Cystic neoplasm or abscess are considered less likely. Consider CT cystogram for further evaluation. Colonic diverticulosis, without radiographic evidence of diverticulitis. Stable large right and small left pleural effusions. Electronically Signed   By: Earle Gell M.D.   On: 10/04/2017 19:27   Dg Abd Portable 1v  Result Date: 10/06/2017 CLINICAL DATA:  Follow up small bowel dilatation EXAM: PORTABLE ABDOMEN - 1 VIEW COMPARISON:  None. FINDINGS: Scattered mildly dilated loops of small bowel are again identified roughly stable from the previous exam. Previously administered contrast now lies within the colon on the right indicating a likely partial small bowel obstruction. No free air is noted. Degenerative changes of lumbar spine are seen. IMPRESSION: Persistent small bowel dilatation likely related to a partial small bowel obstruction. Electronically Signed   By: Inez Catalina M.D.   On: 10/06/2017 07:02   Dg Abd Portable 1v  Result Date: 10/04/2017 CLINICAL DATA:  Constipation EXAM: PORTABLE ABDOMEN - 1 VIEW COMPARISON:  10/03/2017 FINDINGS: There are numerous dilated of small bowel measuring up to 4.6 cm. There is a small amount of air in the ascending colon. There is no evidence of pneumoperitoneum, portal venous gas or pneumatosis. There are no pathologic calcifications along the expected course of the ureters. The osseous structures are unremarkable. IMPRESSION: Numerous dilated loops of small bowel similar to the prior examination most concerning for bowel obstruction. No significant interval change. Electronically Signed   By: Kathreen Devoid   On: 10/04/2017 10:51     Anti-infectives: Anti-infectives (From admission, onward)   Start     Dose/Rate Route Frequency Ordered Stop   10/06/17 1000  amoxicillin-clavulanate (AUGMENTIN) 875-125 MG per tablet 1 tablet     1 tablet Oral Every 12 hours 10/06/17 0722 10/09/17 0959   10/04/17 1300  Ampicillin-Sulbactam (UNASYN) 3 g in sodium chloride 0.9 % 100 mL IVPB  Status:  Discontinued     3 g 200 mL/hr over 30 Minutes Intravenous Every 6 hours 10/04/17 1228 10/06/17 0722   10/04/17 1200  Ampicillin-Sulbactam (UNASYN) 3 g in sodium chloride 0.9 % 100 mL IVPB  Status:  Discontinued     3 g 200 mL/hr over 30 Minutes Intravenous Every 6 hours 10/04/17 0752 10/04/17 1228   10/03/17 0800  vancomycin (VANCOCIN) IVPB 1000 mg/200 mL premix  Status:  Discontinued     1,000 mg 200 mL/hr over 60 Minutes Intravenous Every 24 hours 10/03/17 0754 10/03/17 1925   10/01/17 1400  piperacillin-tazobactam (ZOSYN) IVPB 3.375 g  Status:  Discontinued     3.375 g 12.5 mL/hr over 240 Minutes Intravenous Every 8 hours 10/01/17 0816 10/04/17 0752   10/01/17 0600  vancomycin (VANCOCIN) IVPB 1000 mg/200 mL premix  Status:  Discontinued     1,000 mg 200 mL/hr over 60 Minutes Intravenous Every 48 hours 09/29/17 1531 09/29/17 2055   09/30/17 0900  piperacillin-tazobactam (ZOSYN) IVPB 2.25 g  Status:  Discontinued     2.25 g 100 mL/hr over 30 Minutes Intravenous Every  6 hours 09/30/17 0814 10/01/17 0816   09/30/17 0600  vancomycin (VANCOCIN) IVPB 1000 mg/200 mL premix  Status:  Discontinued     1,000 mg 200 mL/hr over 60 Minutes Intravenous Every 48 hours 09/30/17 0547 09/30/17 0749   09/30/17 0600  ampicillin-sulbactam (UNASYN) 1.5 g in sodium chloride 0.9 % 100 mL IVPB  Status:  Discontinued     1.5 g 200 mL/hr over 30 Minutes Intravenous Every 12 hours 09/30/17 0547 09/30/17 0749   09/29/17 2200  cefTRIAXone (ROCEPHIN) 2 g in sodium chloride 0.9 % 100 mL IVPB  Status:  Discontinued     2 g 200 mL/hr over 30 Minutes Intravenous  Every 24 hours 09/29/17 2055 09/30/17 0527   09/29/17 1600  piperacillin-tazobactam (ZOSYN) IVPB 2.25 g  Status:  Discontinued     2.25 g 100 mL/hr over 30 Minutes Intravenous Every 6 hours 09/29/17 1531 09/29/17 2055   09/29/17 0445  piperacillin-tazobactam (ZOSYN) IVPB 3.375 g     3.375 g 100 mL/hr over 30 Minutes Intravenous  Once 09/29/17 0436 09/29/17 0517   09/29/17 0445  vancomycin (VANCOCIN) IVPB 1000 mg/200 mL premix     1,000 mg 200 mL/hr over 60 Minutes Intravenous  Once 09/29/17 0436 09/29/17 0551       Assessment/Plan AKI on CKD stage 3 Prostate adenocarcinoma Proteus bacteremia Possibly aspiration PNA Dysphagia C.Diff antigen positive, toxin negative - patient not having diarrhea, not sure he needs to be on enteric precautions  SBO vs ileus - KUB stable - having bowel function and tolerated CLD - advance to FLD and see how he does - mobilize as able - palliative care planning family meeting Friday Hypokalemia- K 4.0  Hypomagnesemia - 1.6 yesterday, replaced ABL anemia/GIB- Hgb 10.1, stable, GI consulting as well   FEN: FLD, bowel regimen ordered VTE: SCDs ID: vanc/zosyn 6/14; rocephin 6/14; unasyn 6/15>6/21; PO augmentin 6/21>>    LOS: 7 days    Brigid Re , [facility] 10/06/2017, 9:21 AM Pager: [phone] Consults: [phone] Mon-Fri 7:00 am-4:30 pm Sat-Sun 7:00 am-11:30 am

## 2017-10-07 ENCOUNTER — Inpatient Hospital Stay (HOSPITAL_COMMUNITY): Payer: Medicare HMO

## 2017-10-07 LAB — BASIC METABOLIC PANEL
ANION GAP: 5 (ref 5–15)
BUN: 14 mg/dL (ref 6–20)
CHLORIDE: 115 mmol/L — AB (ref 101–111)
CO2: 27 mmol/L (ref 22–32)
Calcium: 8.1 mg/dL — ABNORMAL LOW (ref 8.9–10.3)
Creatinine, Ser: 1.31 mg/dL — ABNORMAL HIGH (ref 0.61–1.24)
GFR calc Af Amer: 51 mL/min — ABNORMAL LOW (ref >60)
GFR calc non Af Amer: 44 mL/min — ABNORMAL LOW (ref >60)
GLUCOSE: 105 mg/dL — AB (ref 65–99)
POTASSIUM: 4.2 mmol/L (ref 3.5–5.1)
Sodium: 147 mmol/L — ABNORMAL HIGH (ref 135–145)

## 2017-10-07 LAB — MAGNESIUM: Magnesium: 1.9 mg/dL (ref 1.7–2.4)

## 2017-10-07 MED ORDER — SODIUM CHLORIDE 0.9 % IV SOLN
INTRAVENOUS | Status: DC
  Administered 2017-10-07: 17:00:00 via INTRAVENOUS

## 2017-10-07 NOTE — Progress Notes (Signed)
[facility] Office:  [phone] General Surgery Progress Note   LOS: 8 days  POD -     Chief Complaint: Abdominal pain  Assessment and Plan: 1.  SBO vs ileus  Taking full liquids.  Had BM.  KUB is about the same  Clinically okay  2.  ABL anemia/GI bleed  Hgb - 9.2 - 10/07/2017  On Protonix 3.  AKI on CKD stage 3  Creatinine - 1.32 - 10/07/2017 4.  Prostate adenocarcinoma 5.  Possibly aspiration PNA  Augmentin 6.  Dysphagia 7.  C.Diff antigen positive, toxin negative - patient not having diarrhea, not sure he needs to be on enteric precautions   Principal Problem:   Sepsis ([place]) Active Problems:   Urinary retention   Lower urinary tract infectious disease   Acute encephalopathy   CKD (chronic kidney disease) stage 3, GFR 30-59 ml/min (HCC)   Community acquired pneumonia of right lower lobe of lung (HCC)   Prostate cancer ([place])   Proteus septicemia ([place])   Ileus ([place])   Coffee ground emesis   Abnormal CT of the abdomen   SBO (small bowel obstruction) (HCC)  Subjective:  No complaint.  His daughter, [name], is at the bedside.  He is from [facility] in [place].  Objective:   Vitals:   10/07/17 0618 10/07/17 0914  BP: 135/67   Pulse: 91   Resp: 18   Temp: 98.1 F (36.7 C)   SpO2: 100% 98%     Intake/Output from previous day:  06/21 0701 - 06/22 0700 In: 0  Out: 450 [Urine:450]  Intake/Output this shift:  No intake/output data recorded.   Physical Exam:   General: Older WM who is alert and oriented.    HEENT: Normal. Pupils equal. .   Lungs: Clear   Abdomen: Mild distention with few BS   Lab Results:    Recent Labs    10/06/17 0647  WBC 9.2  HGB 10.1*  HCT 33.1*  PLT 324    BMET   Recent Labs    10/06/17 0647 10/07/17 0428  NA 144 147*  K 4.0 4.2  CL 116* 115*  CO2 19* 27  GLUCOSE 84 105*  BUN 13 14  CREATININE 1.13 1.31*  CALCIUM 8.0* 8.1*    PT/INR  No results for input(s): LABPROT, INR in the last 72  hours.  ABG  No results for input(s): PHART, HCO3 in the last 72 hours.  Invalid input(s): PCO2, PO2   Studies/Results:  Dg Abd Portable 1v  Result Date: 10/07/2017 CLINICAL DATA:  Follow-up small bowel obstruction. EXAM: PORTABLE ABDOMEN - 1 VIEW COMPARISON:  10/06/2017 FINDINGS: There is mild diffuse small bowel dilatation with the overall degree of dilatation appearing slightly improved. Oral contrast material has passed into the more distal colon. No gross intraperitoneal free air is identified on this supine study. Diffuse lumbar disc degeneration is noted. IMPRESSION: Slight improvement of small bowel dilatation likely reflecting ongoing partial obstruction. Electronically Signed   By: [place] Bores M.D.   On: 10/07/2017 07:14   Dg Abd Portable 1v  Result Date: 10/06/2017 CLINICAL DATA:  Follow up small bowel dilatation EXAM: PORTABLE ABDOMEN - 1 VIEW COMPARISON:  None. FINDINGS: Scattered mildly dilated loops of small bowel are again identified roughly stable from the previous exam. Previously administered contrast now lies within the colon on the right indicating a likely partial small bowel obstruction. No free air is noted. Degenerative changes of lumbar spine are seen. IMPRESSION: Persistent small bowel dilatation likely  related to a partial small bowel obstruction. Electronically Signed   By: Inez Catalina M.D.   On: 10/06/2017 07:02     Anti-infectives:   Anti-infectives (From admission, onward)   Start     Dose/Rate Route Frequency Ordered Stop   10/06/17 1000  amoxicillin-clavulanate (AUGMENTIN) 875-125 MG per tablet 1 tablet     1 tablet Oral Every 12 hours 10/06/17 0722 10/09/17 0959   10/04/17 1300  Ampicillin-Sulbactam (UNASYN) 3 g in sodium chloride 0.9 % 100 mL IVPB  Status:  Discontinued     3 g 200 mL/hr over 30 Minutes Intravenous Every 6 hours 10/04/17 1228 10/06/17 0722   10/04/17 1200  Ampicillin-Sulbactam (UNASYN) 3 g in sodium chloride 0.9 % 100 mL IVPB  Status:   Discontinued     3 g 200 mL/hr over 30 Minutes Intravenous Every 6 hours 10/04/17 0752 10/04/17 1228   10/03/17 0800  vancomycin (VANCOCIN) IVPB 1000 mg/200 mL premix  Status:  Discontinued     1,000 mg 200 mL/hr over 60 Minutes Intravenous Every 24 hours 10/03/17 0754 10/03/17 1925   10/01/17 1400  piperacillin-tazobactam (ZOSYN) IVPB 3.375 g  Status:  Discontinued     3.375 g 12.5 mL/hr over 240 Minutes Intravenous Every 8 hours 10/01/17 0816 10/04/17 0752   10/01/17 0600  vancomycin (VANCOCIN) IVPB 1000 mg/200 mL premix  Status:  Discontinued     1,000 mg 200 mL/hr over 60 Minutes Intravenous Every 48 hours 09/29/17 1531 09/29/17 2055   09/30/17 0900  piperacillin-tazobactam (ZOSYN) IVPB 2.25 g  Status:  Discontinued     2.25 g 100 mL/hr over 30 Minutes Intravenous Every 6 hours 09/30/17 0814 10/01/17 0816   09/30/17 0600  vancomycin (VANCOCIN) IVPB 1000 mg/200 mL premix  Status:  Discontinued     1,000 mg 200 mL/hr over 60 Minutes Intravenous Every 48 hours 09/30/17 0547 09/30/17 0749   09/30/17 0600  ampicillin-sulbactam (UNASYN) 1.5 g in sodium chloride 0.9 % 100 mL IVPB  Status:  Discontinued     1.5 g 200 mL/hr over 30 Minutes Intravenous Every 12 hours 09/30/17 0547 09/30/17 0749   09/29/17 2200  cefTRIAXone (ROCEPHIN) 2 g in sodium chloride 0.9 % 100 mL IVPB  Status:  Discontinued     2 g 200 mL/hr over 30 Minutes Intravenous Every 24 hours 09/29/17 2055 09/30/17 0527   09/29/17 1600  piperacillin-tazobactam (ZOSYN) IVPB 2.25 g  Status:  Discontinued     2.25 g 100 mL/hr over 30 Minutes Intravenous Every 6 hours 09/29/17 1531 09/29/17 2055   09/29/17 0445  piperacillin-tazobactam (ZOSYN) IVPB 3.375 g     3.375 g 100 mL/hr over 30 Minutes Intravenous  Once 09/29/17 0436 09/29/17 0517   09/29/17 0445  vancomycin (VANCOCIN) IVPB 1000 mg/200 mL premix     1,000 mg 200 mL/hr over 60 Minutes Intravenous  Once 09/29/17 0436 09/29/17 0551      Alphonsa Overall, MD, FACS Pager:  [facility] Office: [phone] 10/07/2017

## 2017-10-07 NOTE — Progress Notes (Signed)
  Speech Language Pathology Treatment: Dysphagia  Patient Details Name: Anthony Nicholson MRN: [id number] DOB: [birth year] Today's Date: 10/07/2017 Time: 3329-5188 SLP Time Calculation (min) (ACUTE ONLY): 19 min  Assessment / Plan / Recommendation Clinical Impression  Pt seen for follow-up for dysphagia. Surgery following for SBO vs ileus, currently taking full liquids per MD. SLP provided skilled observation, differential diagnosis with thin liquids, which pt consumed via cup and straw. Multiple swallows not noted this date, however pt's phonation impaired, aphonia appears exacerbated by decreased breath support. There is delayed throat clearing, with pt reporting sensation of "fluid" and attempting to expectorate "phlegm." Question primary esophageal etiology/reflux vs pharyngeal dysphagia. From a medical standpoint, pt not ready for MBS. I did discuss instrumental testing with pt, daughter given questionable signs of aspiration and possible RLL PNA. Will consider if indicated when clinically appropriate and if it would have an impact on treatment, depending on family's goals of care. Continue full liquids and advance per MD, with aspiration and esophageal precautions. Will follow up.      HPI HPI: [name] is a 84 year old man with a history of prostate cancer and urinary retention with indwelling Foley catheter, chronic kidney disease stage III, hypothyroidism, and dysphagia who presented to the ED from [facility] where he was reported to have hemoptysis and dyspnea with decreased oxygen saturation to 80 to 90%.  During the previous day at his facility he developed the urinary complaints and was found to have an obstructed/nonfunctioning Foley catheter which had to be exchanged last night to resume urine output.  His family who was present earlier this morning states his current confusion is different from baseline and they believesimilar to previous mental status changes when suffering UTIs in  the past.  Due to his confusion I am unable to get a accurate course of events at his facility from the patient during my interview.  At my exam his primary complaint is of ongoing suprapubic and penile pain. He also complains of constipation with no bowel movements in the past few days despite straining with attempts.  Most recent CXR is showing patchin RLL opacity with small pleural effusion suspicious for PNA.        SLP Plan  Continue with current plan of care;MBS(consider MBS when appropriate from GI standpoint)       Recommendations  Diet recommendations: Thin liquid Liquids provided via: Cup;Straw Medication Administration: Whole meds with liquid Supervision: Staff to assist with self feeding;Intermittent supervision to cue for compensatory strategies Compensations: Slow rate;Small sips/bites Postural Changes and/or Swallow Maneuvers: Seated upright 90 degrees                Oral Care Recommendations: Oral care BID Follow up Recommendations: Other (comment)(tbd) SLP Visit Diagnosis: Dysphagia, unspecified (R13.10) Plan: Continue with current plan of care;MBS(consider MBS when appropriate from GI standpoint)       [place], [place], [place] Pathologist [phone]  Aliene Altes 10/07/2017, 10:21 AM

## 2017-10-07 NOTE — Discharge Summary (Addendum)
Name: Anthony Nicholson MRN: [id number] DOB: [birth year] 84 y.o. PCP: [facility], [facility]  Date of Admission: 09/29/2017  3:59 AM Date of Discharge: 10/08/17 Attending Physician: Anthony Filler, MD  Discharge Diagnosis: 1. Sepsis 2. Proteus Bacteremia 3. 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 Aspiration PNA, Chronic Dysphagia 5. Hypoxic respiratory failure 6. Hematemesis, Acute on Chronic Anemia 7. Partial SBO, Chronic Constipation  Discharge Medications: Allergies as of 10/08/2017   No Known Allergies     Medication List    STOP taking these medications   saccharomyces boulardii 250 MG capsule Commonly known as:  FLORASTOR   sodium bicarbonate 650 MG tablet     TAKE these medications   acetaminophen 325 MG tablet Commonly known as:  TYLENOL Take 650 mg by mouth every 6 (six) hours as needed for mild pain.   benzonatate 100 MG capsule Commonly known as:  TESSALON Take 1 capsule (100 mg total) by mouth 3 (three) times daily as needed for cough.   BIOFREEZE 4 % Gel Generic drug:  Menthol (Topical Analgesic) Apply topically daily as needed (for pain to upper back and bilateral shoulder).   carboxymethylcellulose 0.5 % Soln Commonly known as:  REFRESH PLUS Place 1 drop into both eyes 3 (three) times daily as needed (dry eye).   cholecalciferol 1000 units tablet Commonly known as:  VITAMIN D Take 2,000 Units by mouth daily.   docusate sodium 100 MG capsule Commonly known as:  COLACE Take 1 capsule (100 mg total) by mouth 2 (two) times daily as needed for mild constipation.   feeding supplement (ENSURE ENLIVE) Liqd Take 237 mLs by mouth 2 (two) times daily between meals.   folic acid 1 MG tablet Commonly known as:  FOLVITE Take 1 mg by mouth daily.   ipratropium-albuterol 0.5-2.5 (3) MG/3ML Soln Commonly known as:  DUONEB Take 3 mLs by nebulization 2 (two) times daily.   levothyroxine 25 MCG tablet Commonly known as:   SYNTHROID, LEVOTHROID Take 25 mcg by mouth daily before breakfast.   loperamide 2 MG tablet Commonly known as:  IMODIUM A-D Take 2 mg by mouth as needed for diarrhea or loose stools.   pantoprazole 40 MG tablet Commonly known as:  PROTONIX Take 1 tablet (40 mg total) by mouth daily at 6 (six) AM. Start taking on:  10/09/2017   polyethylene glycol packet Commonly known as:  MIRALAX / GLYCOLAX Take 17 g by mouth daily as needed for mild constipation.   UNABLE TO FIND Take 120 mLs by mouth 2 (two) times daily. Med Name: MedPass 2.0   vitamin B-12 500 MCG tablet Commonly known as:  CYANOCOBALAMIN Take 500 mcg by mouth daily.       Disposition and follow-up:   Mr.Anthony Nicholson was discharged from [facility] in fair condition.  At the hospital follow up visit please address:  1. Proteus Bacteremia: Admission blood cultures 6/14 with pan-sensitive Proteus mirabilus; source felt to be urinary related to his chronic foley. He completed a 10-day course of antibiotics during his hospitalization with end date 6/23. Was initially on Zosyn which was transitioned to Unasyn and eventually Augmentin. He tolerated treatment well without recurrent fevers. Repeat blood cultures drawn 6/18 NGTD. His chronic bladder outlet obstruction and chronic foley will continue to be potential sources of infection and will need to be monitored.   Aspiration PNA: Treated with course of antibiotics as noted above. He did have a small to moderate right-sided pleural effusion at  time of discharge which will likely take time to improve. Seen by SLP who recommended a dysphagia 2 diet which should be continued unless family opts for palliative feeds. Unfortunately has chronic dysphagia and will be at risk for future aspiration as well. If patient were to become febrile again, consider aspiration pneumonia.   Hypoxia: Discharged with 1L via [place] and maintained sats >90%. Most likely sequelae of prolonged  hospitalization, deconditioning and aspiration pneumonia. Wean as able although may have new oxygen requirement.   Partial SBO: Patient with chronic constipation who developed symptomatic partial SBO which resolved with bowel rest. If patient were to develop recurrent nausea, vomiting and abdominal pain would consider KUB and bowel rest as initial management. CT with large bladder diverticulum which could be intermittently obstructing but no surgical intervention offered by General Surgery.   Hematemesis: Resolved with holding Lovenox for VTE ppx. He recived a total of 2units pRBCs with appropriate hemoglobin response. Started on Protonix which should be continued outpatient.   Prolonged Hospitalization, Deconditioning: Patient worked with PT and OT throughout admission however did become weaker during his long length of stay. He is discharged back to [facility] (SNF) to work on Visual merchandiser and endurance.   2.  Labs / imaging needed at time of follow-up: NTD  3.  Pending labs/ test needing follow-up: Blood cultures 6/18  Follow-up Appointments: Contact information for after-discharge care    Destination    [facility] .   Service:  Skilled Nursing Contact information: [place] [facility] Hospital Course by problem list: 1. Sepsis, Proteus Bacteremia, Aspiration Pneumonia, Hypoxia, Chronic Dysphagia 84 y/o M with prostate cancer with chronic foley catheter, dementia, chronic dysphagia and hypothyroidism who presented from [facility] 09/29/17 with multiple complaints. Family noticed increased confusion similar to prior UTIs however papers from facility cite dyspnea and hypoxia to 80% as the reason for his ED evaluation. Patients only complaint was penile and suprapubic pain.   In ED, he was febrile to 102.8, tachycardic with soft BPs and found to have AKI, elevated lactic acid (3.3) and  leukocytosis (13.9). CXR suggested RLL PNA. UA consistent with infection. Blood cultures were obtained (eventually grew Proteus felt to be related to indwelling catheter) and he was given IVF and empiric Vancomycin and Zosyn. Zosyn continued and eventually transitioned to IV Unasyn and eventually PO Augmentin prior to discharge once culture results returned to complete a 10-day course of antibiotics. Repeat blood cultures 6/18 NGTD. He was unable to be weaned from supplemental O2 and was discharged on supplemental O2 via [place] which is to be weaned at his facility. Given his chronic dysphagia he will continue to be at risk for aspiration.   Prostate Adenocarcinoma, Chronic Indwelling Catheter, Urinary Retention Patients main complaint at time of admission was penile pain and suprapubic pain which family notes occurs when his foley is clogged. It was unable to be flushed and his catheter was subsequently exchanged 6/14 with relief of symptoms. His catheter remained patient throughout admission.   Hematemesis, Anemia Patient noted dark oral secretions and staff noted patient regurgitating dark liquid which was positive for occult blood. Lowest Hb 6.0 but improved to 9.9 after transfusion of 2 units PRBs. He was evaluated by GI who did not feel he was an optimal candidate for EGD and recommended CT scan, discontinuing Lovenox for VTE ppx and observation for continued bleeding. He had resolution  of hematemesis and stable Hb throughout remainder of admission. He was started and will be continued on Protonix.   Partial SBO vs Ileus, Chronic Constipation Patient also complained of constipation on admission and developed nausea and vomiting. Plain film with ileus vs partial SBO. Gen surg was consulted who followed along. CT's with partial SBO vs ileus and a large bladder diverticulum was just adjacent to the 'transition point.' Treated with bowel rest and did not require NGT placement. He had several days of BMs  including day of discharge and tolerated PO intake for several days prior to discharge. He was instructed to advance his diet slowly. No abdominal pain for several days prior to dc. Consider a scheduled bowel regimen should he continue to experience constipation.  Goals of Care Discussion MOST form on admission specified DNR status with agreement for hospitalziation, antibiotics and IVF if appropriate. No changes were made to this. Multiple discussions with family and palliative care indicated that they do not want aggressive measures such as surgical or endoscopic interventions. DNR form signed.   Large Bladder Diverticulum Found on CT abdomen/pelvis ordered to evaluate hematemesis. He has history of chronic bladder diverticula. No surgical management was indicated.   C. Diff Colonization, +MRSA Nasal Swab C. Diff screen obtained by RN after pt had 1 episode of diarrhea. This returned as follows: +C diff Antigen, +Toxigenic C Diff by PCR, C diff toxin NEGATIVE. His nasal swab also positive for MRSA. He is a long-time facility resident and is likely colonized with both. No excessive diarrhea or persistent leukocytosis to suggest C. Diff colitis.   Discharge Vitals:   BP 135/67 (BP Location: Left Arm)   Pulse 91   Temp 98.1 F (36.7 C) (Oral)   Resp 18   Ht 5\' 6"  (1.676 m)   Wt 136 lb (61.7 kg)   SpO2 98%   BMI 21.95 kg/m   Pertinent Labs, Studies, and Procedures:   CXR 09/29/17: IMPRESSION: Patchy right lower lung zone opacity with small pleural effusion, suspicious for pneumonia.  KUB 09/29/17: IMPRESSION: AP portable supine abdomen demonstrating distended mid abdominal small bowel loops which could represent early partial obstruction versus ileus. Continued surveillance is warranted.  CT ABDOMEN/PELVIS WO CONTRAST 10/02/17: IMPRESSION: 1. Contrast and fluid-filled distention of jejunal loops without focal transition point identified. Findings may represent small bowel ileus or  dysmotility rather than mechanical bowel obstruction. 2. Colonic diverticulosis without acute diverticulitis. 3. Amorphous 4.4 x 2.8 cm mid pelvic density series 3/76 may represent a decompressed bladder diverticulum as previously seen due to decompression of the bladder by Foley catheter on current study. Extraluminal evolving abscess or inflammatory phlegmon are among other possibilities in addition to decompressed bowel. Repeat imaging is recommended to determine whether the patient's ingested enteric contrast fills this area and thus represents unopacified bowel versus an extraluminal abnormality and potentially contributing to the ileus bowel gas pattern. 4. Decompressed urinary bladder with calculi. No obstructive uropathy. 5. Moderate right and small left pleural effusions with pleural plaque on the left. 6. Thoracolumbar spondylosis. No osteoblastic disease in this patient with history of prostate cancer. 7. Diffuse mild soft tissue anasarca.  CT ABDOMEN PELVIS W CONTRAST 10/04/17: IMPRESSION: 1. Distal small bowel obstruction, with transition point in the central pelvis, suspicious for adhesion. 2. Cystic lesion adjacent to anterior bladder dome suspicious for large bladder diverticulum, given findings of chronic bladder outlet obstruction smaller diverticula on prior studies. Cystic neoplasm or abscess are considered less likely. Consider CT cystogram for further evaluation.  3. Colonic diverticulosis, without radiographic evidence of diverticulitis. 4. Stable large right and small left pleural effusions.  KUB 10/07/17: IMPRESSION: Slight improvement of small bowel dilatation likely reflecting ongoing partial obstruction.    Ref. Range 09/30/2017 06:51 10/02/2017 09:39  C Diff antigen Latest Ref Range: NEGATIVE  POSITIVE (A)   C Diff interpretation Unknown Results are indet...   C Diff toxin Latest Ref Range: NEGATIVE  NEGATIVE   Toxigenic C. Difficile by PCR Latest  Ref Range: NEGATIVE  POSITIVE (A)   Fecal Occult Blood, POC Latest Ref Range: NEGATIVE   POSITIVE (A)   Blood cultures 09/29/17: Proteus Mirabilis in all bottles. Pan-sensitive Urine Culture: Multiple species  Signed: Deloyd Handy, DO 10/07/2017, 1:06 PM   Pager: [phone]

## 2017-10-07 NOTE — Progress Notes (Signed)
Unable yo wean off O2. Placed back on 3L per [place]. MD notified.

## 2017-10-07 NOTE — Progress Notes (Signed)
Subjective:  [name] was seen lying comfortably in bed this morning. Family was not at the bedside. Has had a few BMs and denied further nausea, vomiting or abdominal pain. No hematemesis. Tolerated diet. Continues to deny cough or SOB.  Objective:  Vital signs in last 24 hours: Vitals:   10/07/17 0618 10/07/17 0914 10/07/17 1455 10/07/17 1502  BP: 135/67     Pulse: 91     Resp: 18     Temp: 98.1 F (36.7 C)     TempSrc: Oral     SpO2: 100% 98% (!) 73% 98%  Weight:      Height:       Weight change:   Intake/Output Summary (Last 24 hours) at 10/07/2017 1548 Last data filed at 10/07/2017 0640 Gross per 24 hour  Intake 0 ml  Output 450 ml  Net -450 ml   GENERAL-Frailbut pleasant elderly caucasianman in no acute distress.  HEENT-Oral mucosa moist, some white on tongue. Bilateral hearing aids in place, no cervical lymphadenopathy.  CARDIAC-Normal rate, regular rhythm, no murmur. Distal pulses 2+ bilaterally. No LE edema. No JVD. Capillary refill <3 secin distal phalanges. RESP- Breathing comfortably on [place].Symmetric chest wall expansion.LCTAB. No stridor or wheezing.  ABDOMEN-Bowel sounds are normoactive.Minimally distended.No tenderness to palpation. No rebound or guarding.No hepatosplenomegaly. NEURO-Alertand oriented to self.EOM's intact.Moves all extremities appropriately. SKIN-Diffuse actinic keratoses, bruising, no rashes. Skin warm and dry.  Assessment/Plan:  Active Problems: Sepsis with Proteus bacteremia Possible Aspiration Pneumonia Ileus vs. SBO AcuteAnemia Hoarseness Prostate adenocarcinoma Dysphagia  84 year old male with PMH of urinary obstruction from prostate cancer with chronic indwelling Foley catheter, CKD stage III, hypothyroidism, and baseline dysphagiaadmitted with proteus bacteremia (indwelling foley with frequent changes), aspiration pneumonia, ileus vs sbo and GIB.  Sepsis,Proteus bacteremia Possible Aspiration  Pneumonia-Proteus bacteremia from indwelling foley with frequent instrumentation.Remains afebrile, mental status is at baseline. Blood culturewas pansensitive. There was concern for possible aspiration PNAon initial presentation, so taking this into account with antibiotic regimen. Repeat blood cultures from 10/03/2017 are negative. -Switch to PO Augmentin (day 9/10 of antibiotics) -PRN Duonebs -Strict I/O's -Delirium precautions  Ileus vs. Partial SBO- CT abdomen/pelvis on 6/19 showed SBO with possible transition point but patient continues to have BMs and denies further nausea or vomiting. Suspect partial SBO which is resolving. CT showed large bladder diverticulum which could be underlying etiology but likely no surgical intervention required.  -Goal K+>4.0, replete to meet goal -If emesis or worsening distension, consider NG tube for decompression -Daily BMP -Appreciate Surgery recs -Appreciate Palliative care following  Acuteon Chronic Anemia-Noadditional episodes of hematemesis over past 5 days.Hgb stable at 10.Has known chronic anemia and received IV iron in the past.Acute anemia was likely 2/2 to GI bleed given hematemesis and may represent stress ulcer.Continue to monitor closely. -IV Protonix 40 mg BID -Appreciate GI recs  Hoarseness- Suspect acute laryngitis 2/2 to upper respiratory infection or small aspiration events and will continue to monitor. Small white discoloration on tongue but patient seen eating grits.   Prostate adenocarcinoma- Diagnosed with prostate adenocarcinoma, Gleason 7, back in 2016 and underwent androgen deprivation therapy at the time, no surgery or radiation. Has a chronic indwelling Foley catheter for urinary retentionand replaced with a Coude catheter6/14. No hematuria or dysuria this morning and having good urine output. Will continue to follow I/Os closely.   Dysphagia- Swallow evaluation recommended continued dysphagia 2 diet with thin  liquids, which he was on at ALF.  WCB:JSEGBTD diet per GI recs, replete lytes  prn, IV Protonix 40 mg BID VTE ppx:SCD's Code Status: DNR  Dispo:Anticipate discharge in 1 days pending tolerating PO.   LOS: 8 days   Merriam Brandner, DO 10/07/2017, 3:45 PM Pager: [phone]

## 2017-10-07 NOTE — Social Work (Signed)
CSW awaits summary for discharge. Pt telesitter discontinued on 6/21. Pt will discharge back to [facility]. DNR needs to go with pt. Will call [facility] when summary available.   [place], [place] Work [phone]

## 2017-10-07 NOTE — Social Work (Signed)
CSW spoke with pt daughter [name], she expressed displeasure and concern over discharge stating that "there have been doctors in and out of this room all day and not one has said that he is going today." Pt daughter also states "I told the doctor yesterday that we would be at a funeral today and I gave a hand written note to the tech Roselyn Reef this morning." CSW explained that all consults have been completed and that the attending MD makes the determination whether pt is medically appropriate for discharge and we would be able to arrange discharge transportation for pt today. Pt daughter again expressed concern about not being there to make sure pt settles into facility given that funeral is in [place].   CSW contacted attending MD, MD willing to discharge pt tomorrow morning first thing to [facility]. Janie, admissions liaison from [facility] also aware. Pt daughter states she will be available for pt to discharge anytime "before noon."  Weekend CSW will be available tomorrow to assist in discharge. [place], [place] Work [phone]

## 2017-10-07 NOTE — Progress Notes (Signed)
[facility] INTERVAL PROGRESS NOTE  Paged by RN that patients family is out of town at a funeral and want to be present during transport back to facility (daughter [name] mentioned something about not being able to get his belongings and tell him he'll be ok overnight). Was arranging DC to [facility] for this afternoon however will keep patient overnight for continued treatment and likely DC tomorrow to facility. We appreciate CSW and staffs assistance in management of this patient.   Einar Gip, DO Internal Medicine - PGY2 [phone]

## 2017-10-07 NOTE — Progress Notes (Addendum)
[facility] Progress Note   Chief Complaint:   Ileus vrs partial SBO   SUBJECTIVE:    feels okay, family feeding him breakfast.    ASSESSMENT AND PLAN:   61. 84 yo male with ileus vrs psbo. He is tolerating grits this am. Had 2 BMs last night, describes them as being loose, Today's KUB shows overall improvement.  -continue with full liquids for now (just advanced from clears yesterday).  -keep electrolytes within normal range -OOB as much as possible (in bedside chair now)  2. ? Oral candida on antibiotics. Hard to tell as eating grits but does appear to have thick coating on tongue.  -Monitor for now and recheck when not eating grits.   3. Dark stools, heme positive for hgb stable ~ 10.  -continue PPI    Attending physician's note   I have taken an interval history, reviewed the chart and examined the patient. I agree with the Advanced Practitioner's note, impression and recommendations. Ileus vs pSBO clinically improved with only slight radiographic improvement. Remain on full liquids today. Repeat KUB tomorrow. Hb stable. No evidence of recurrent GI bleeding.   Lucio Edward, MD FACG [phone] office     OBJECTIVE:     Vital signs in last 24 hours: Temp:  [97.7 F (36.5 C)-98.1 F (36.7 C)] 98.1 F (36.7 C) (06/22 0618) Pulse Rate:  [81-97] 91 (06/22 0618) Resp:  [15-20] 18 (06/22 0618) BP: (131-140)/(60-67) 135/67 (06/22 0618) SpO2:  [94 %-100 %] 98 % (06/22 0914) Last BM Date: 10/06/17 General:   Alert, frail white male in chair in NAD EENT:  Normal hearing, non icteric sclera, conjunctive pink.  Heart:  Regular rate and rhythm Pulm: raspy voice, occas wheezing but normal respiratory effort Abdomen:  Soft, distended and tympanitic, nontender. Hypoactive bowel sounds Neurologic:  Alert and  oriented x4;  grossly normal neurologically. Psych:  Pleasant, cooperative.  Normal mood and affect.   Intake/Output from previous day: 06/21  0701 - 06/22 0700 In: 0  Out: 450 [Urine:450] Intake/Output this shift: No intake/output data recorded.  Lab Results: Recent Labs    10/06/17 0647  WBC 9.2  HGB 10.1*  HCT 33.1*  PLT 324   BMET Recent Labs    10/05/17 0639 10/06/17 0647 10/07/17 0428  NA 145 144 147*  K 3.7 4.0 4.2  CL 114* 116* 115*  CO2 20* 19* 27  GLUCOSE 63* 84 105*  BUN 17 13 14   CREATININE 1.19 1.13 1.31*  CALCIUM 8.1* 8.0* 8.1*   Dg Abd Portable 1v  Result Date: 10/07/2017 CLINICAL DATA:  Follow-up small bowel obstruction. EXAM: PORTABLE ABDOMEN - 1 VIEW COMPARISON:  10/06/2017 FINDINGS: There is mild diffuse small bowel dilatation with the overall degree of dilatation appearing slightly improved. Oral contrast material has passed into the more distal colon. No gross intraperitoneal free air is identified on this supine study. Diffuse lumbar disc degeneration is noted. IMPRESSION: Slight improvement of small bowel dilatation likely reflecting ongoing partial obstruction. Electronically Signed   By: Logan Bores M.D.   On: 10/07/2017 07:14   Dg Abd Portable 1v  Result Date: 10/06/2017 CLINICAL DATA:  Follow up small bowel dilatation EXAM: PORTABLE ABDOMEN - 1 VIEW COMPARISON:  None. FINDINGS: Scattered mildly dilated loops of small bowel are again identified roughly stable from the previous exam. Previously administered contrast now lies within the colon on the right indicating a likely partial small bowel obstruction. No free air is noted. Degenerative changes of  lumbar spine are seen. IMPRESSION: Persistent small bowel dilatation likely related to a partial small bowel obstruction. Electronically Signed   By: Inez Catalina M.D.   On: 10/06/2017 07:02     Principal Problem:   Sepsis ([place]) Active Problems:   Urinary retention   Lower urinary tract infectious disease   Acute encephalopathy   CKD (chronic kidney disease) stage 3, GFR 30-59 ml/min (HCC)   Community acquired pneumonia of right lower  lobe of lung (HCC)   Prostate cancer ([place])   Proteus septicemia ([place])   Ileus (HCC)   Coffee ground emesis   Abnormal CT of the abdomen   SBO (small bowel obstruction) ([place])     LOS: 8 days   Tye Savoy ,NP 10/07/2017, 9:45 AM

## 2017-10-08 DIAGNOSIS — E46 Unspecified protein-calorie malnutrition: Secondary | ICD-10-CM | POA: Diagnosis not present

## 2017-10-08 DIAGNOSIS — A419 Sepsis, unspecified organism: Secondary | ICD-10-CM | POA: Diagnosis not present

## 2017-10-08 DIAGNOSIS — J181 Lobar pneumonia, unspecified organism: Secondary | ICD-10-CM | POA: Diagnosis not present

## 2017-10-08 DIAGNOSIS — E039 Hypothyroidism, unspecified: Secondary | ICD-10-CM | POA: Diagnosis not present

## 2017-10-08 DIAGNOSIS — J9691 Respiratory failure, unspecified with hypoxia: Secondary | ICD-10-CM | POA: Diagnosis not present

## 2017-10-08 DIAGNOSIS — J690 Pneumonitis due to inhalation of food and vomit: Secondary | ICD-10-CM | POA: Diagnosis not present

## 2017-10-08 DIAGNOSIS — N390 Urinary tract infection, site not specified: Secondary | ICD-10-CM | POA: Diagnosis not present

## 2017-10-08 DIAGNOSIS — L89622 Pressure ulcer of left heel, stage 2: Secondary | ICD-10-CM | POA: Diagnosis not present

## 2017-10-08 DIAGNOSIS — N401 Enlarged prostate with lower urinary tract symptoms: Secondary | ICD-10-CM

## 2017-10-08 DIAGNOSIS — C183 Malignant neoplasm of hepatic flexure: Secondary | ICD-10-CM

## 2017-10-08 DIAGNOSIS — Z79899 Other long term (current) drug therapy: Secondary | ICD-10-CM | POA: Diagnosis not present

## 2017-10-08 DIAGNOSIS — K5669 Other partial intestinal obstruction: Secondary | ICD-10-CM | POA: Diagnosis not present

## 2017-10-08 DIAGNOSIS — R7881 Bacteremia: Secondary | ICD-10-CM | POA: Diagnosis not present

## 2017-10-08 DIAGNOSIS — R05 Cough: Secondary | ICD-10-CM | POA: Diagnosis not present

## 2017-10-08 DIAGNOSIS — R278 Other lack of coordination: Secondary | ICD-10-CM | POA: Diagnosis not present

## 2017-10-08 DIAGNOSIS — C61 Malignant neoplasm of prostate: Secondary | ICD-10-CM | POA: Diagnosis not present

## 2017-10-08 DIAGNOSIS — R070 Pain in throat: Secondary | ICD-10-CM | POA: Diagnosis not present

## 2017-10-08 DIAGNOSIS — K56600 Partial intestinal obstruction, unspecified as to cause: Secondary | ICD-10-CM

## 2017-10-08 DIAGNOSIS — J156 Pneumonia due to other aerobic Gram-negative bacteria: Secondary | ICD-10-CM

## 2017-10-08 DIAGNOSIS — J9601 Acute respiratory failure with hypoxia: Secondary | ICD-10-CM | POA: Diagnosis not present

## 2017-10-08 DIAGNOSIS — K567 Ileus, unspecified: Secondary | ICD-10-CM | POA: Diagnosis not present

## 2017-10-08 DIAGNOSIS — Z743 Need for continuous supervision: Secondary | ICD-10-CM | POA: Diagnosis not present

## 2017-10-08 DIAGNOSIS — A4159 Other Gram-negative sepsis: Secondary | ICD-10-CM | POA: Diagnosis not present

## 2017-10-08 DIAGNOSIS — N138 Other obstructive and reflux uropathy: Secondary | ICD-10-CM | POA: Diagnosis not present

## 2017-10-08 DIAGNOSIS — Z22322 Carrier or suspected carrier of Methicillin resistant Staphylococcus aureus: Secondary | ICD-10-CM

## 2017-10-08 DIAGNOSIS — R509 Fever, unspecified: Secondary | ICD-10-CM | POA: Diagnosis not present

## 2017-10-08 DIAGNOSIS — D649 Anemia, unspecified: Secondary | ICD-10-CM | POA: Diagnosis not present

## 2017-10-08 DIAGNOSIS — J918 Pleural effusion in other conditions classified elsewhere: Secondary | ICD-10-CM | POA: Diagnosis not present

## 2017-10-08 DIAGNOSIS — E870 Hyperosmolality and hypernatremia: Secondary | ICD-10-CM

## 2017-10-08 DIAGNOSIS — K5900 Constipation, unspecified: Secondary | ICD-10-CM | POA: Diagnosis not present

## 2017-10-08 DIAGNOSIS — R490 Dysphonia: Secondary | ICD-10-CM | POA: Diagnosis not present

## 2017-10-08 DIAGNOSIS — D62 Acute posthemorrhagic anemia: Secondary | ICD-10-CM | POA: Diagnosis not present

## 2017-10-08 DIAGNOSIS — D529 Folate deficiency anemia, unspecified: Secondary | ICD-10-CM | POA: Diagnosis not present

## 2017-10-08 DIAGNOSIS — R0902 Hypoxemia: Secondary | ICD-10-CM | POA: Diagnosis not present

## 2017-10-08 DIAGNOSIS — R2689 Other abnormalities of gait and mobility: Secondary | ICD-10-CM | POA: Diagnosis not present

## 2017-10-08 DIAGNOSIS — R1312 Dysphagia, oropharyngeal phase: Secondary | ICD-10-CM | POA: Diagnosis not present

## 2017-10-08 DIAGNOSIS — M6281 Muscle weakness (generalized): Secondary | ICD-10-CM | POA: Diagnosis not present

## 2017-10-08 DIAGNOSIS — R1310 Dysphagia, unspecified: Secondary | ICD-10-CM | POA: Diagnosis not present

## 2017-10-08 DIAGNOSIS — Z2239 Carrier of other specified bacterial diseases: Secondary | ICD-10-CM

## 2017-10-08 DIAGNOSIS — R279 Unspecified lack of coordination: Secondary | ICD-10-CM | POA: Diagnosis not present

## 2017-10-08 LAB — COMPREHENSIVE METABOLIC PANEL
ALK PHOS: 51 U/L (ref 38–126)
ALT: 11 U/L — AB (ref 17–63)
ANION GAP: 6 (ref 5–15)
AST: 13 U/L — ABNORMAL LOW (ref 15–41)
Albumin: 2.1 g/dL — ABNORMAL LOW (ref 3.5–5.0)
BILIRUBIN TOTAL: 0.6 mg/dL (ref 0.3–1.2)
BUN: 11 mg/dL (ref 6–20)
CALCIUM: 7.9 mg/dL — AB (ref 8.9–10.3)
CO2: 25 mmol/L (ref 22–32)
CREATININE: 1.1 mg/dL (ref 0.61–1.24)
Chloride: 112 mmol/L — ABNORMAL HIGH (ref 101–111)
GFR, EST NON AFRICAN AMERICAN: 54 mL/min — AB (ref >60)
Glucose, Bld: 93 mg/dL (ref 65–99)
Potassium: 3.7 mmol/L (ref 3.5–5.1)
Sodium: 143 mmol/L (ref 135–145)
TOTAL PROTEIN: 4.9 g/dL — AB (ref 6.5–8.1)

## 2017-10-08 LAB — CULTURE, BLOOD (ROUTINE X 2)
CULTURE: NO GROWTH
Culture: NO GROWTH
SPECIAL REQUESTS: ADEQUATE
Special Requests: ADEQUATE

## 2017-10-08 LAB — CBC WITH DIFFERENTIAL/PLATELET
ABS IMMATURE GRANULOCYTES: 0.1 10*3/uL (ref 0.0–0.1)
BASOS PCT: 0 %
Basophils Absolute: 0 10*3/uL (ref 0.0–0.1)
EOS ABS: 0.5 10*3/uL (ref 0.0–0.7)
Eosinophils Relative: 4 %
HCT: 27.8 % — ABNORMAL LOW (ref 39.0–52.0)
Hemoglobin: 8.5 g/dL — ABNORMAL LOW (ref 13.0–17.0)
IMMATURE GRANULOCYTES: 1 %
Lymphocytes Relative: 4 %
Lymphs Abs: 0.5 10*3/uL — ABNORMAL LOW (ref 0.7–4.0)
MCH: 28.4 pg (ref 26.0–34.0)
MCHC: 30.6 g/dL (ref 30.0–36.0)
MCV: 93 fL (ref 78.0–100.0)
MONOS PCT: 4 %
Monocytes Absolute: 0.5 10*3/uL (ref 0.1–1.0)
NEUTROS ABS: 11.3 10*3/uL — AB (ref 1.7–7.7)
NEUTROS PCT: 87 %
PLATELETS: 325 10*3/uL (ref 150–400)
RBC: 2.99 MIL/uL — ABNORMAL LOW (ref 4.22–5.81)
RDW: 16.1 % — AB (ref 11.5–15.5)
WBC: 12.9 10*3/uL — ABNORMAL HIGH (ref 4.0–10.5)

## 2017-10-08 MED ORDER — BENZONATATE 100 MG PO CAPS: 100.0000 mg | ORAL_CAPSULE | Freq: Three times a day (TID) | ORAL | 0 refills | Status: DC | PRN

## 2017-10-08 MED ORDER — IPRATROPIUM-ALBUTEROL 0.5-2.5 (3) MG/3ML IN SOLN: 3.0000 mL | Freq: Two times a day (BID) | RESPIRATORY_TRACT | 0 refills | Status: DC

## 2017-10-08 MED ORDER — DOCUSATE SODIUM 100 MG PO CAPS: 100.0000 mg | ORAL_CAPSULE | Freq: Two times a day (BID) | ORAL | 0 refills | Status: DC | PRN

## 2017-10-08 MED ORDER — AMOXICILLIN-POT CLAVULANATE 875-125 MG PO TABS
1.0000 | ORAL_TABLET | Freq: Two times a day (BID) | ORAL | Status: AC
  Administered 2017-10-08: 1 via ORAL

## 2017-10-08 MED ORDER — POLYETHYLENE GLYCOL 3350 17 G PO PACK: 17.0000 g | PACK | Freq: Every day | ORAL | 0 refills | Status: DC | PRN

## 2017-10-08 MED ORDER — ENSURE ENLIVE PO LIQD: 237.0000 mL | Freq: Two times a day (BID) | ORAL | 12 refills | Status: DC

## 2017-10-08 MED ORDER — PANTOPRAZOLE SODIUM 40 MG PO TBEC: 40.0000 mg | DELAYED_RELEASE_TABLET | Freq: Every day | ORAL | 0 refills | Status: DC

## 2017-10-08 NOTE — Progress Notes (Signed)
Clinical Social Worker facilitated patient discharge including contacting patient's daughter and facility [facility]) to confirm patient discharge plans.  Clinical information faxed to facility and family agreeable with plan.  CSW arranged ambulance transport via [facility] to [place] .  RN to call for report prior to discharge [phone]. Room 203  Clinical Social Worker will sign off for now as social work intervention is no longer needed. Please consult [place] again if new need arises.  [place]am[place] Maxine Fredman LCSWA [phone]

## 2017-10-08 NOTE — Progress Notes (Signed)
   Subjective:  Anthony Nicholson was seen sleeping comfortably in bed today. Easily arousable and states he feels well. Denied any abdominal pain or trouble breathing. Reports he had a BM and is eating OK.   Talked with daughter [name] this morning. Agreeable to discharge today and appreciative of patients care and understanding providers. She had several questions re: medications and oxygen which were answered.   Objective:  Vital signs in last 24 hours: Vitals:   10/07/17 2000 10/07/17 2117 10/07/17 2138 10/08/17 0651  BP:   (!) 126/54 (!) 147/69  Pulse:   91 91  Resp:   19 20  Temp:   97.9 F (36.6 C)   TempSrc:   Oral   SpO2: 100% 99% 98% 100%  Weight:      Height:       Weight change:   Intake/Output Summary (Last 24 hours) at 10/08/2017 1032 Last data filed at 10/08/2017 1000 Gross per 24 hour  Intake 920.24 ml  Output 476 ml  Net 444.24 ml   GENERAL-Frail pleasant elderly caucasianman in no acute distress.  HEENT-Oral mucosa moist. Bilateral hearing aids in place. On 1L via [place]. CARDIAC-Normal rate, regular rhythm, no murmur. RESP- Breathing comfortably on [place].Symmetric chest wall expansion. No stridor or wheezing.  ABDOMEN-Bowel sounds are normoactive.Minimally distended.No tenderness to palpation. No rebound or guarding. NEURO-Alertand oriented to self.EOM's intact.Moves all extremities. SKIN-Diffuse actinic keratoses, bruising, no rashes. Skin warm and dry.  Assessment/Plan:  Active Problems: Sepsis with Proteus bacteremia Possible Aspiration Pneumonia Ileus vs. SBO AcuteAnemia Hoarseness Prostate adenocarcinoma Dysphagia  84 year old male with PMH of urinary obstruction from prostate cancer with chronic indwelling Foley catheter, CKD stage III, hypothyroidism, and baseline dysphagiaadmitted with proteus bacteremia (indwelling foley with frequent changes), aspiration pneumonia, ileus vs partial sbo and GIB.  Sepsis,Proteus  bacteremia Aspiration Pneumonia- Will complete his 10-day course of antibiotics today for Proteus bacteremia and aspiration pneumonia. Repeat Bcx 6/18 NGTD and he remains afebrile. POCUS with small-mod right pleural effusion which appeared uncomplicated. He is saturating well on 1L via  and will be weaned at SNF.  -DC to SNF today after receiving last dose of Augmentin -O2 to be weaned as able at facility  Partial SBO- Patient tolerating PO intake without N/V or abdominal pain. Has been having regular BMs. Suspect patient with chronic partial SBO related to large bladder diverticulum. Does not currently require surgical intervention although doubt surgical intervention would be in-line with his goals of care. Pt to advance diet as tolerated at facility and to implement bowel rest should he develop N/V/abdominal distention.   Acuteon Chronic Anemia-Hb slightly lower than 2-days prior (8.4, was 10) but patient received gentle IVF overnight due to hypernatremia. No further hematemesis noted and suspect he might have had a stress ulcer due to his significant illness on presentation. He will be discharged to facility with protonix.   Hoarseness- Suspect acute laryngitis 2/2 to upper respiratory infection or small aspiration events and will continue to monitor. No significant evidence for thrush but could be treated at facility should this develop.   Prostate adenocarcinoma- Has a chronic indwelling Foley catheter for urinary retentionand replaced with a Coude catheter6/14. No hematuria or dysuria this morning and having good urine output.  ONG:EXBMWUX diet per GI recs, replete lytes prn, IV Protonix 40 mg BID VTE ppx:SCD's Code Status: DNR  Dispo:Anticipate discharge to SNF today.   LOS: 9 days   Tannah Dreyfuss, DO 10/08/2017, 10:32 AM Pager: [phone]

## 2017-10-08 NOTE — Progress Notes (Signed)
     [facility] Progress Note   Chief Complaint:   Ileus    SUBJECTIVE:    Feels okay. Tolerating grits / ensure. For discharge to [facility] today    ASSESSMENT AND PLAN:    84 yo male with ileus vrs psbo, favor ileus. Resolving. Tolerating grits / ensure today. No N /V. No abdominal pain. Improvement on yesterday's film and exam even more improved today. For discharge to [facility] today.  -advance diet as tolerated.  -starting PT, mobilization will help him -spoke to daughter, she will call office for any recurrent abdominal distention, N/V.   OBJECTIVE:     Vital signs in last 24 hours: Temp:  [97.8 F (36.6 C)-97.9 F (36.6 C)] 97.9 F (36.6 C) (06/22 2138) Pulse Rate:  [91-94] 91 (06/23 0651) Resp:  [19-20] 20 (06/23 0651) BP: (126-147)/(54-69) 147/69 (06/23 0651) SpO2:  [73 %-100 %] 100 % (06/23 0651) Last BM Date: 10/07/17 General:   Alert, male in NAD EENT:  Normal hearing, non icteric sclera, conjunctive pink. No obvious candida in mouth today Pulm: Normal respiratory effort Abdomen:  Soft, soft, mild-moderately distended, nontender. Hypoactive bowel sounds    Neurologic:  Alert and  oriented x4;  grossly normal neurologically. Psych:  Pleasant, cooperative.  Normal mood and affect.   Intake/Output from previous day: 06/22 0701 - 06/23 0700 In: 342.9 [P.O.:270; I.V.:72.9] Out: 476 [Urine:475; Stool:1] Intake/Output this shift: Total I/O In: 697.3 [I.V.:697.3] Out: -   Lab Results: Recent Labs    10/06/17 0647 10/08/17 0512  WBC 9.2 12.9*  HGB 10.1* 8.5*  HCT 33.1* 27.8*  PLT 324 325   BMET Recent Labs    10/06/17 0647 10/07/17 0428 10/08/17 0512  NA 144 147* 143  K 4.0 4.2 3.7  CL 116* 115* 112*  CO2 19* 27 25  GLUCOSE 84 105* 93  BUN 13 14 11   CREATININE 1.13 1.31* 1.10  CALCIUM 8.0* 8.1* 7.9*   LFT Recent Labs    10/08/17 0512  PROT 4.9*  ALBUMIN 2.1*  AST 13*  ALT 11*  ALKPHOS 51  BILITOT 0.6    PT/INR No results for input(s): LABPROT, INR in the last 72 hours. Hepatitis Panel No results for input(s): HEPBSAG, HCVAB, HEPAIGM, HEPBIGM in the last 72 hours.  Dg Abd Portable 1v  Result Date: 10/07/2017 CLINICAL DATA:  Follow-up small bowel obstruction. EXAM: PORTABLE ABDOMEN - 1 VIEW COMPARISON:  10/06/2017 FINDINGS: There is mild diffuse small bowel dilatation with the overall degree of dilatation appearing slightly improved. Oral contrast material has passed into the more distal colon. No gross intraperitoneal free air is identified on this supine study. Diffuse lumbar disc degeneration is noted. IMPRESSION: Slight improvement of small bowel dilatation likely reflecting ongoing partial obstruction. Electronically Signed   By: Logan Bores M.D.   On: 10/07/2017 07:14    Principal Problem:   Sepsis ([place]) Active Problems:   Urinary retention   Lower urinary tract infectious disease   Acute encephalopathy   CKD (chronic kidney disease) stage 3, GFR 30-59 ml/min (HCC)   Community acquired pneumonia of right lower lobe of lung (HCC)   Prostate cancer ([place])   Proteus septicemia ([place])   Ileus ([place])   Coffee ground emesis   Abnormal CT of the abdomen   SBO (small bowel obstruction) ([place])     LOS: 9 days   Tye Savoy ,NP 10/08/2017, 10:56 AM

## 2017-10-08 NOTE — Final Consult Note (Signed)
Consultant Final Sign-Off Note    Assessment/Final recommendations  Anthony Nicholson is a 84 y.o. male followed by [facility] for SBO vs ileus.  He is having bowel function and tolerating a diet.  No further surgical indications.  Please call if anything changes.   Wound care (if applicable):    Diet at discharge: per primary team   Activity at discharge: per primary team   Follow-up appointment:  PRN   Pending results:  Unresulted Labs (From admission, onward)   None       Medication recommendations:   Other recommendations:    Thank you for allowing [place] to participate in the care of your patient!  Please consult [place] again if you have further needs for your patient.  Hettie Roselli C. 5/00/9381 8:29 AM    Subjective     Objective  Vital signs in last 24 hours: Temp:  [97.8 F (36.6 C)-97.9 F (36.6 C)] 97.9 F (36.6 C) (06/22 2138) Pulse Rate:  [91-94] 91 (06/23 0651) Resp:  [19-20] 20 (06/23 0651) BP: (126-147)/(54-69) 147/69 (06/23 0651) SpO2:  [73 %-100 %] 100 % (06/23 0651)  General: NAD Abd: soft   Pertinent labs and Studies: Recent Labs    10/06/17 0647 10/08/17 0512  WBC 9.2 12.9*  HGB 10.1* 8.5*  HCT 33.1* 27.8*   BMET Recent Labs    10/07/17 0428 10/08/17 0512  NA 147* 143  K 4.2 3.7  CL 115* 112*  CO2 27 25  GLUCOSE 105* 93  BUN 14 11  CREATININE 1.31* 1.10  CALCIUM 8.1* 7.9*   No results for input(s): LABURIN in the last 72 hours. Results for orders placed or performed during the hospital encounter of 09/29/17  Culture, blood (Routine x 2)     Status: Abnormal   Collection Time: 09/29/17  4:15 AM  Result Value Ref Range Status   Specimen Description BLOOD RIGHT HAND  Final   Special Requests   Final    BOTTLES DRAWN AEROBIC AND ANAEROBIC Blood Culture adequate volume   Culture  Setup Time   Final    GRAM NEGATIVE RODS CRITICAL VALUE NOTED.  VALUE IS CONSISTENT WITH PREVIOUSLY REPORTED AND CALLED VALUE. IN BOTH AEROBIC AND  ANAEROBIC BOTTLES    Culture (A)  Final    PROTEUS MIRABILIS DIPHTHEROIDS(CORYNEBACTERIUM SPECIES) Standardized susceptibility testing for this organism is not available. Performed at [facility], [place] [address] [place], [place] [address]    Report Status 10/02/2017 FINAL  Final  Culture, blood (Routine x 2)     Status: Abnormal   Collection Time: 09/29/17  4:26 AM  Result Value Ref Range Status   Specimen Description BLOOD LEFT HAND  Final   Special Requests   Final    BOTTLES DRAWN AEROBIC AND ANAEROBIC Blood Culture results may not be optimal due to an excessive volume of blood received in culture bottles   Culture  Setup Time   Final    GRAM NEGATIVE RODS IN BOTH AEROBIC AND ANAEROBIC BOTTLES CRITICAL RESULT CALLED TO, READ BACK BY AND VERIFIED WITHKarlene Einstein [facility] 9678 09/29/17 A BROWNING Performed at [facility], [place] [address], [place], [place] [address]    Culture PROTEUS MIRABILIS (A)  Final   Report Status 10/01/2017 FINAL  Final   Organism ID, Bacteria PROTEUS MIRABILIS  Final      Susceptibility   Proteus mirabilis - MIC*    AMPICILLIN <=2 SENSITIVE Sensitive     CEFAZOLIN <=4 SENSITIVE Sensitive  CEFEPIME <=1 SENSITIVE Sensitive     CEFTAZIDIME <=1 SENSITIVE Sensitive     CEFTRIAXONE <=1 SENSITIVE Sensitive     CIPROFLOXACIN <=0.25 SENSITIVE Sensitive     GENTAMICIN <=1 SENSITIVE Sensitive     IMIPENEM 1 SENSITIVE Sensitive     TRIMETH/SULFA <=20 SENSITIVE Sensitive     AMPICILLIN/SULBACTAM <=2 SENSITIVE Sensitive     PIP/TAZO <=4 SENSITIVE Sensitive     * PROTEUS MIRABILIS  Blood Culture ID Panel (Reflexed)     Status: Abnormal   Collection Time: 09/29/17  4:26 AM  Result Value Ref Range Status   Enterococcus species NOT DETECTED NOT DETECTED Final   Listeria monocytogenes NOT DETECTED NOT DETECTED Final   Staphylococcus species NOT DETECTED NOT DETECTED Final   Staphylococcus aureus NOT DETECTED NOT DETECTED Final   Streptococcus  species NOT DETECTED NOT DETECTED Final   Streptococcus agalactiae NOT DETECTED NOT DETECTED Final   Streptococcus pneumoniae NOT DETECTED NOT DETECTED Final   Streptococcus pyogenes NOT DETECTED NOT DETECTED Final   Acinetobacter baumannii NOT DETECTED NOT DETECTED Final   Enterobacteriaceae species DETECTED (A) NOT DETECTED Final    Comment: Enterobacteriaceae represent a large family of gram-negative bacteria, not a single organism. CRITICAL RESULT CALLED TO, READ BACK BY AND VERIFIED WITH: Karlene Einstein PHARMD 2047 09/29/17 A BROWNING    Enterobacter cloacae complex NOT DETECTED NOT DETECTED Final   Escherichia coli NOT DETECTED NOT DETECTED Final   Klebsiella oxytoca NOT DETECTED NOT DETECTED Final   Klebsiella pneumoniae NOT DETECTED NOT DETECTED Final   Proteus species DETECTED (A) NOT DETECTED Final    Comment: CRITICAL RESULT CALLED TO, READ BACK BY AND VERIFIED WITH: Karlene Einstein PHARMD 2047 09/29/17 A BROWNING    Serratia marcescens NOT DETECTED NOT DETECTED Final   Carbapenem resistance NOT DETECTED NOT DETECTED Final   Haemophilus influenzae NOT DETECTED NOT DETECTED Final   Neisseria meningitidis NOT DETECTED NOT DETECTED Final   Pseudomonas aeruginosa NOT DETECTED NOT DETECTED Final   Candida albicans NOT DETECTED NOT DETECTED Final   Candida glabrata NOT DETECTED NOT DETECTED Final   Candida krusei NOT DETECTED NOT DETECTED Final   Candida parapsilosis NOT DETECTED NOT DETECTED Final   Candida tropicalis NOT DETECTED NOT DETECTED Final    Comment: Performed at [facility], [place] [address], [place], [place] [address]  Urine culture     Status: Abnormal   Collection Time: 09/29/17  4:28 AM  Result Value Ref Range Status   Specimen Description URINE, CATHETERIZED  Final   Special Requests   Final    NONE Performed at [facility], [place] [address] [place], [place] [address]    Culture MULTIPLE SPECIES PRESENT, SUGGEST RECOLLECTION (A)  Final   Report Status  09/30/2017 FINAL  Final  MRSA PCR Screening     Status: Abnormal   Collection Time: 09/29/17  6:34 PM  Result Value Ref Range Status   MRSA by PCR POSITIVE (A) NEGATIVE Final    Comment:        The GeneXpert MRSA Assay (FDA approved for NASAL specimens only), is one component of a comprehensive MRSA colonization surveillance program. It is not intended to diagnose MRSA infection nor to guide or monitor treatment for MRSA infections. RESULT CALLED TO, READ BACK BY AND VERIFIED WITHLaural Roes RN 2045 09/29/17 A BROWNING Performed at [facility], [place] [address], [place], [place] [address]   C difficile quick scan w PCR reflex  Status: Abnormal   Collection Time: 09/30/17  6:51 AM  Result Value Ref Range Status   C Diff antigen POSITIVE (A) NEGATIVE Final   C Diff toxin NEGATIVE NEGATIVE Final   C Diff interpretation Results are indeterminate. See PCR results.  Final    Comment: Performed at [facility], [place] [address] [place], [place] [address]  C. Diff by PCR, Reflexed     Status: Abnormal   Collection Time: 09/30/17  6:51 AM  Result Value Ref Range Status   Toxigenic C. Difficile by PCR POSITIVE (A) NEGATIVE Final    Comment: Positive for toxigenic C. difficile with little to no toxin production. Only treat if clinical presentation suggests symptomatic illness. Performed at [facility], [place] [address] [place], [place] [address]   Culture, blood (routine x 2)     Status: None (Preliminary result)   Collection Time: 10/03/17  7:51 AM  Result Value Ref Range Status   Specimen Description BLOOD RIGHT ANTECUBITAL  Final   Special Requests   Final    BOTTLES DRAWN AEROBIC AND ANAEROBIC Blood Culture adequate volume   Culture   Final    NO GROWTH 4 DAYS Performed at [facility], [place] [address] [place], [place] [address]    Report Status PENDING  Incomplete  Culture, blood (routine x 2)     Status: None (Preliminary result)   Collection Time:  10/03/17  7:52 AM  Result Value Ref Range Status   Specimen Description BLOOD LEFT ANTECUBITAL  Final   Special Requests   Final    BOTTLES DRAWN AEROBIC AND ANAEROBIC Blood Culture adequate volume   Culture   Final    NO GROWTH 4 DAYS Performed at [facility], [place] [address], [place], [place] [address]    Report Status PENDING  Incomplete    Imaging: No results found.

## 2017-10-08 NOTE — Progress Notes (Signed)
Internal Medicine Attending:   I saw and examined the patient. I reviewed the resident's note and I agree with the resident's findings and plan as documented in the resident's note.  Patient is doing well this morning, SBO is resolving nicely, he tolerated soft foods, had a BM, and has no signs of distention or abdominal tenderness today. His right pleural effusion is small, probably due to deconditioning and mild volume overload, would just re-image in a 6 weeks to ensure it resolves. [place] for [employer] today to SNF side of his facility.

## 2017-10-08 NOTE — Progress Notes (Signed)
Discharge report given to [facility]. [facility] picked pt up.  His daughter and Son-in-Law was here at time of transport.  Pt VS stable, and transferred well to stretcher.

## 2017-10-09 DIAGNOSIS — K5669 Other partial intestinal obstruction: Secondary | ICD-10-CM | POA: Diagnosis not present

## 2017-10-09 DIAGNOSIS — R7881 Bacteremia: Secondary | ICD-10-CM | POA: Diagnosis not present

## 2017-10-09 DIAGNOSIS — J690 Pneumonitis due to inhalation of food and vomit: Secondary | ICD-10-CM | POA: Diagnosis not present

## 2017-10-09 DIAGNOSIS — N138 Other obstructive and reflux uropathy: Secondary | ICD-10-CM | POA: Diagnosis not present

## 2017-10-13 DIAGNOSIS — A419 Sepsis, unspecified organism: Secondary | ICD-10-CM | POA: Diagnosis not present

## 2017-10-13 DIAGNOSIS — K5900 Constipation, unspecified: Secondary | ICD-10-CM | POA: Diagnosis not present

## 2017-10-13 DIAGNOSIS — D649 Anemia, unspecified: Secondary | ICD-10-CM | POA: Diagnosis not present

## 2017-10-13 DIAGNOSIS — R1310 Dysphagia, unspecified: Secondary | ICD-10-CM | POA: Diagnosis not present

## 2017-10-13 DIAGNOSIS — E46 Unspecified protein-calorie malnutrition: Secondary | ICD-10-CM | POA: Diagnosis not present

## 2017-10-13 DIAGNOSIS — J690 Pneumonitis due to inhalation of food and vomit: Secondary | ICD-10-CM | POA: Diagnosis not present

## 2017-10-13 DIAGNOSIS — J9601 Acute respiratory failure with hypoxia: Secondary | ICD-10-CM | POA: Diagnosis not present

## 2017-10-16 DIAGNOSIS — J918 Pleural effusion in other conditions classified elsewhere: Secondary | ICD-10-CM | POA: Diagnosis not present

## 2017-10-16 DIAGNOSIS — K5669 Other partial intestinal obstruction: Secondary | ICD-10-CM | POA: Diagnosis not present

## 2017-10-16 DIAGNOSIS — J690 Pneumonitis due to inhalation of food and vomit: Secondary | ICD-10-CM | POA: Diagnosis not present

## 2017-10-16 DIAGNOSIS — R509 Fever, unspecified: Secondary | ICD-10-CM | POA: Diagnosis not present

## 2017-10-18 DIAGNOSIS — J690 Pneumonitis due to inhalation of food and vomit: Secondary | ICD-10-CM | POA: Diagnosis not present

## 2017-10-18 DIAGNOSIS — L89622 Pressure ulcer of left heel, stage 2: Secondary | ICD-10-CM | POA: Diagnosis not present

## 2017-10-18 DIAGNOSIS — N390 Urinary tract infection, site not specified: Secondary | ICD-10-CM | POA: Diagnosis not present

## 2017-10-18 DIAGNOSIS — K5669 Other partial intestinal obstruction: Secondary | ICD-10-CM | POA: Diagnosis not present

## 2017-10-18 DIAGNOSIS — J918 Pleural effusion in other conditions classified elsewhere: Secondary | ICD-10-CM | POA: Diagnosis not present

## 2017-10-19 ENCOUNTER — Other Ambulatory Visit (HOSPITAL_COMMUNITY): Payer: Self-pay | Admitting: Internal Medicine

## 2017-10-19 DIAGNOSIS — R1310 Dysphagia, unspecified: Secondary | ICD-10-CM

## 2017-10-24 DIAGNOSIS — N390 Urinary tract infection, site not specified: Secondary | ICD-10-CM | POA: Diagnosis not present

## 2017-10-24 DIAGNOSIS — J690 Pneumonitis due to inhalation of food and vomit: Secondary | ICD-10-CM | POA: Diagnosis not present

## 2017-10-24 DIAGNOSIS — J918 Pleural effusion in other conditions classified elsewhere: Secondary | ICD-10-CM | POA: Diagnosis not present

## 2017-10-24 DIAGNOSIS — K5669 Other partial intestinal obstruction: Secondary | ICD-10-CM | POA: Diagnosis not present

## 2017-10-25 ENCOUNTER — Ambulatory Visit (HOSPITAL_COMMUNITY): Payer: Medicare HMO

## 2017-10-25 ENCOUNTER — Encounter (HOSPITAL_COMMUNITY): Payer: Medicare HMO

## 2017-10-25 DIAGNOSIS — L89622 Pressure ulcer of left heel, stage 2: Secondary | ICD-10-CM | POA: Diagnosis not present

## 2017-10-27 DIAGNOSIS — J918 Pleural effusion in other conditions classified elsewhere: Secondary | ICD-10-CM | POA: Diagnosis not present

## 2017-10-27 DIAGNOSIS — R070 Pain in throat: Secondary | ICD-10-CM | POA: Diagnosis not present

## 2017-10-27 DIAGNOSIS — J690 Pneumonitis due to inhalation of food and vomit: Secondary | ICD-10-CM | POA: Diagnosis not present

## 2017-10-27 DIAGNOSIS — N138 Other obstructive and reflux uropathy: Secondary | ICD-10-CM | POA: Diagnosis not present

## 2017-10-30 DIAGNOSIS — D529 Folate deficiency anemia, unspecified: Secondary | ICD-10-CM | POA: Diagnosis not present

## 2017-10-30 DIAGNOSIS — E039 Hypothyroidism, unspecified: Secondary | ICD-10-CM | POA: Diagnosis not present

## 2017-10-30 DIAGNOSIS — I10 Essential (primary) hypertension: Secondary | ICD-10-CM | POA: Diagnosis not present

## 2017-10-31 DIAGNOSIS — M6281 Muscle weakness (generalized): Secondary | ICD-10-CM | POA: Diagnosis not present

## 2017-10-31 DIAGNOSIS — R627 Adult failure to thrive: Secondary | ICD-10-CM | POA: Diagnosis not present

## 2017-10-31 DIAGNOSIS — N183 Chronic kidney disease, stage 3 (moderate): Secondary | ICD-10-CM | POA: Diagnosis not present

## 2017-10-31 DIAGNOSIS — L89152 Pressure ulcer of sacral region, stage 2: Secondary | ICD-10-CM | POA: Diagnosis not present

## 2017-11-01 ENCOUNTER — Non-Acute Institutional Stay: Payer: Self-pay | Admitting: Hospice and Palliative Medicine

## 2017-11-01 DIAGNOSIS — L89620 Pressure ulcer of left heel, unstageable: Secondary | ICD-10-CM | POA: Diagnosis not present

## 2017-11-01 DIAGNOSIS — Z515 Encounter for palliative care: Secondary | ICD-10-CM | POA: Diagnosis not present

## 2017-11-01 DIAGNOSIS — L89152 Pressure ulcer of sacral region, stage 2: Secondary | ICD-10-CM | POA: Diagnosis not present

## 2017-11-01 DIAGNOSIS — L89312 Pressure ulcer of right buttock, stage 2: Secondary | ICD-10-CM | POA: Diagnosis not present

## 2017-11-02 DIAGNOSIS — R630 Anorexia: Secondary | ICD-10-CM | POA: Diagnosis not present

## 2017-11-02 DIAGNOSIS — C61 Malignant neoplasm of prostate: Secondary | ICD-10-CM | POA: Diagnosis not present

## 2017-11-02 DIAGNOSIS — J690 Pneumonitis due to inhalation of food and vomit: Secondary | ICD-10-CM | POA: Diagnosis not present

## 2017-11-02 DIAGNOSIS — R627 Adult failure to thrive: Secondary | ICD-10-CM | POA: Diagnosis not present

## 2017-11-02 NOTE — Progress Notes (Signed)
PALLIATIVE CARE CONSULT VISIT   PATIENT NAME: JAKEN FREGIA DOB: 1917/09/19 MRN: [id number]  PRIMARY CARE PROVIDER:   Clinic, [facility]  REFERRING PROVIDER:  Clinic, [place], [place] [address]  RESPONSIBLE PARTY:   Daughter  ASSESSMENT:     I met with patient and his daughter. Daughter describes a recent decline over the past several months with progressive weakness and poor oral intake. This has worsened since patient has returned from the hospital. Patient is now eating minimally (only sips/bites) and she attributes this to the consistency of his food. Patient also has continued to have overt signs of aspiration with frequent coughing spells after eating/drinking. He has not returned to his previous functional baseline, where he could ambulate with a walker. He is now wheelchair bound and requires assistance with ADLs.   Daughter felt it was important to get patient's insight into his treatment goals. We discussed the probability of future decline and risk of rehospitalization. We talked about the option of treatment in place and involvement of hospice. Patient did not seem to follow my conversation and communication with him was difficult. His daughter seemed receptive to the idea of hospice care but wanted to speak with her husband prior to making any decisions.   I discussed the case with SW and Gina, PA-C.  RECOMMENDATIONS and PLAN:  1. Recommend hospice referral  I spent 75 minutes providing this consultation,  from 1400 to 1515. More than 50% of the time in this consultation was spent coordinating communication.   HISTORY OF PRESENT ILLNESS:  ERICK MURIN is a 84 y.o. year old male with multiple medical problems including prostate cancer, chronic urinary retention with indwelling catheter, CKD, hypothyroidism, and dysphagia, who was recently hospitalized 09/29/17 to 10/08/17 with sepsis from UTI and aspiration PNA. He was  also found to have a partial SBO. Patient was seen in consultation by palliative care and ulatimetly discharged to rehab. However, since returning to rehab, patient has had poor oral intake and has not progressed with therapy. We are now asked to follow to help clarify goals.   CODE STATUS: DNR  PPS: 30% HOSPICE ELIGIBILITY/DIAGNOSIS: YES  PAST MEDICAL HISTORY:  Past Medical History:  Diagnosis Date  . Anemia   . Arthritis    "all over" (10/04/2017)  . Basal cell carcinoma   . Chronic back pain   . Chronic indwelling Foley catheter   . Depression    "normal for age" (10/04/2017)  . History of blood transfusion 05/2017; 09/2017  . HOH (hard of hearing)   . Hypothyroidism   . Indwelling urinary catheter present   . Laceration of arm 02/04/2013  . Pneumonia 09/29/2017  . Prostate cancer ([place])   . Recurrent UTI     SOCIAL HX:  Social History   Tobacco Use  . Smoking status: Former Smoker    Types: Cigarettes  . Smokeless tobacco: Never Used  . Tobacco comment: "quit in the 1970s"  Substance Use Topics  . Alcohol use: Not Currently    Alcohol/week: 0.0 oz    ALLERGIES: No Known Allergies   PERTINENT MEDICATIONS:  Outpatient Encounter Medications as of 11/01/2017  Medication Sig  . acetaminophen (TYLENOL) 325 MG tablet Take 650 mg by mouth every 6 (six) hours as needed for mild pain.  . benzonatate (TESSALON) 100 MG capsule Take 1 capsule (100 mg total) by mouth 3 (three) times daily as needed for cough.  . carboxymethylcellulose (REFRESH PLUS) 0.5 %  SOLN Place 1 drop into both eyes 3 (three) times daily as needed (dry eye).  . cholecalciferol (VITAMIN D) 1000 units tablet Take 2,000 Units by mouth daily.  Marland Kitchen docusate sodium (COLACE) 100 MG capsule Take 1 capsule (100 mg total) by mouth 2 (two) times daily as needed for mild constipation.  . feeding supplement, ENSURE ENLIVE, (ENSURE ENLIVE) LIQD Take 237 mLs by mouth 2 (two) times daily between meals.  . folic acid (FOLVITE) 1  MG tablet Take 1 mg by mouth daily.  Marland Kitchen ipratropium-albuterol (DUONEB) 0.5-2.5 (3) MG/3ML SOLN Take 3 mLs by nebulization 2 (two) times daily.  Marland Kitchen levothyroxine (SYNTHROID, LEVOTHROID) 25 MCG tablet Take 25 mcg by mouth daily before breakfast.  . loperamide (IMODIUM A-D) 2 MG tablet Take 2 mg by mouth as needed for diarrhea or loose stools.  . Menthol, Topical Analgesic, (BIOFREEZE) 4 % GEL Apply topically daily as needed (for pain to upper back and bilateral shoulder).  . pantoprazole (PROTONIX) 40 MG tablet Take 1 tablet (40 mg total) by mouth daily at 6 (six) AM.  . polyethylene glycol (MIRALAX / GLYCOLAX) packet Take 17 g by mouth daily as needed for mild constipation.  Marland Kitchen UNABLE TO FIND Take 120 mLs by mouth 2 (two) times daily. Med Name: MedPass 2.0  . vitamin B-12 (CYANOCOBALAMIN) 500 MCG tablet Take 500 mcg by mouth daily.   No facility-administered encounter medications on file as of 11/01/2017.     PHYSICAL EXAM:   General: NAD, frail appearing, thin, sitting in wheelchair Pulmonary: unlabored Skin: wounds noted to feet but not visualized Neurological: Weakness, alert, speech difficult to understand  Irean Hong, NP

## 2017-11-06 DIAGNOSIS — R627 Adult failure to thrive: Secondary | ICD-10-CM | POA: Diagnosis not present

## 2017-11-06 DIAGNOSIS — L89152 Pressure ulcer of sacral region, stage 2: Secondary | ICD-10-CM | POA: Diagnosis not present

## 2017-11-06 DIAGNOSIS — J690 Pneumonitis due to inhalation of food and vomit: Secondary | ICD-10-CM | POA: Diagnosis not present

## 2017-11-06 DIAGNOSIS — L89212 Pressure ulcer of right hip, stage 2: Secondary | ICD-10-CM | POA: Diagnosis not present

## 2017-11-16 DEATH — deceased

## 2018-04-24 IMAGING — CR DG CHEST 2V
3 series · 3 of 3 positions shown · non-contrast
Comparison: 12/11/2014

CLINICAL DATA: Sepsis and fever

EXAM:
CHEST  2 VIEW

[w chest lat]
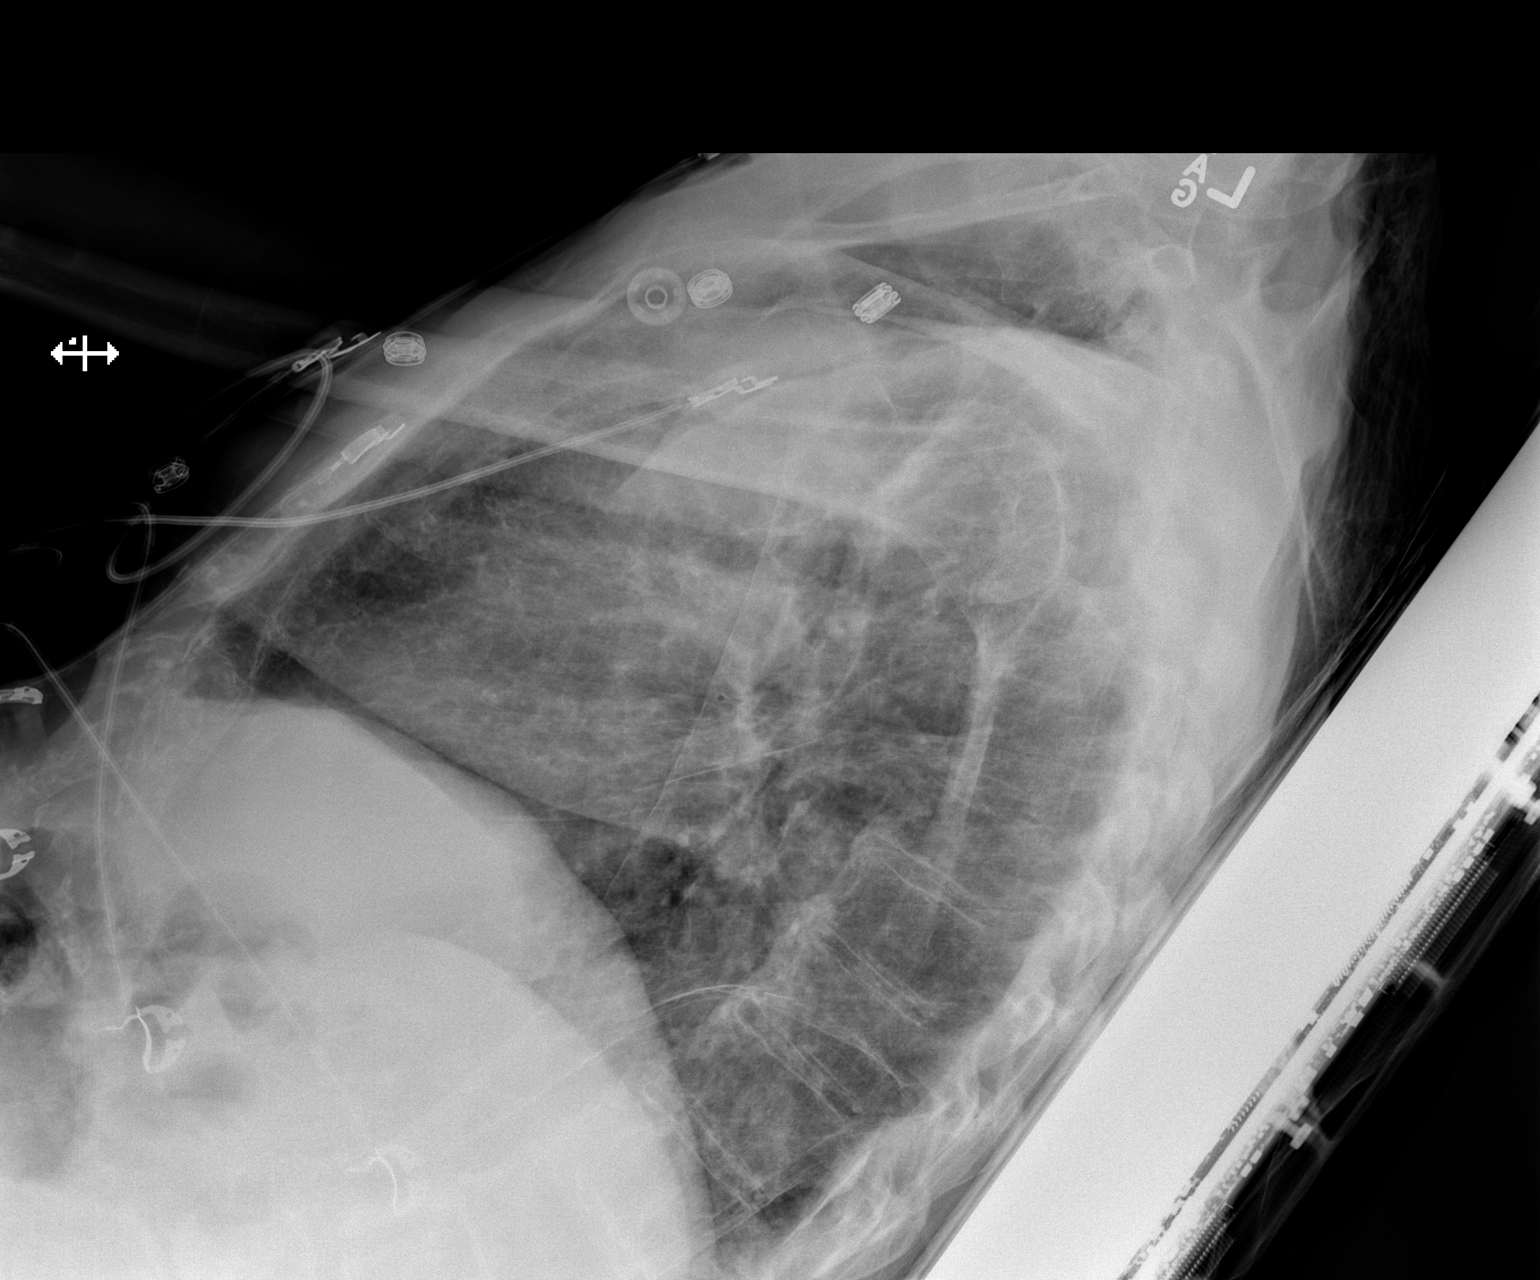

[x chest ap (1 of 2)]
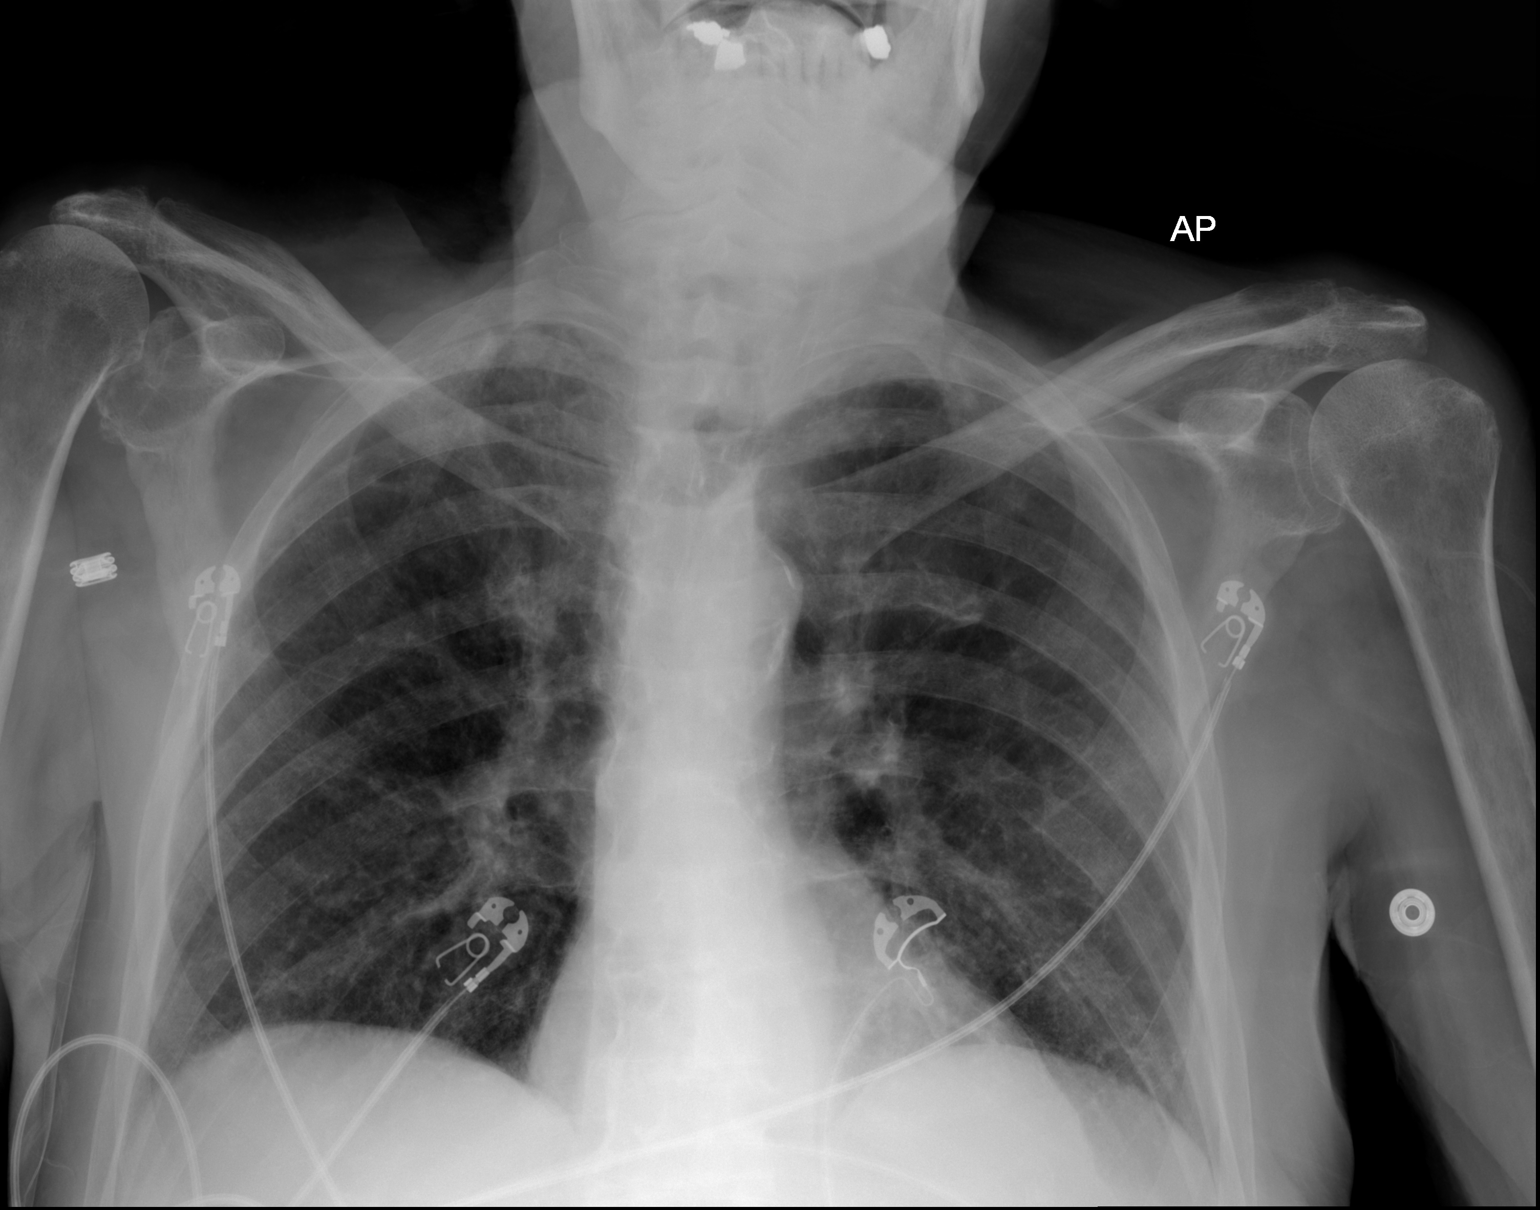

[x chest ap (2 of 2)]
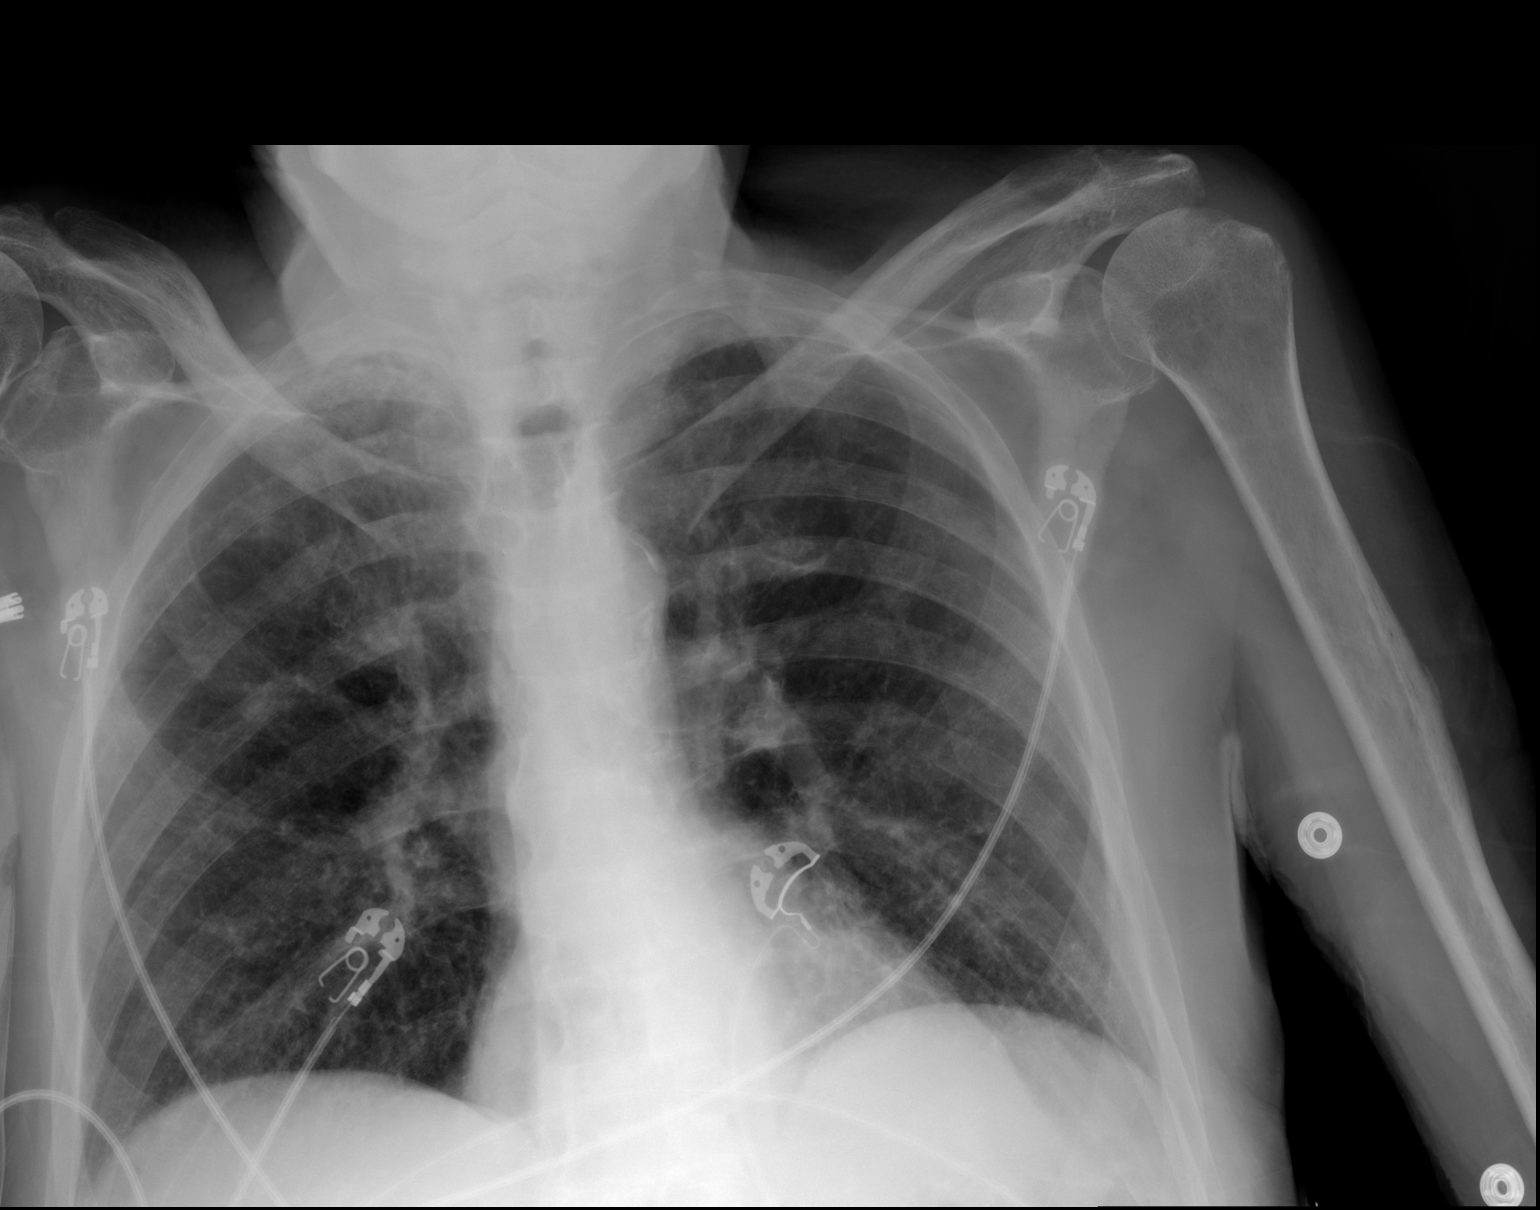

[3 of 3 positions shown; findings below may reference images not displayed]

FINDINGS: No consolidation or pleural effusion. Cardiomediastinal silhouette
within normal limits. Aortic atherosclerosis. No pneumothorax.
Degenerative changes of the spine.
IMPRESSION: No active cardiopulmonary disease.

## 2018-04-25 IMAGING — CT CT RENAL STONE PROTOCOL
2 of 4 series · 14 of 46 positions shown, 16 images · non-contrast
Comparison: 06/06/2017 renal sonogram. 10/03/2014 CT
abdomen/pelvis.

CLINICAL DATA: Inpatient. Acute right lower quadrant abdominal
pain, fever and chills. Hematuria. History of prostate cancer.
Urinary retention with chronic indwelling Foley catheter. Recurrent
urinary tract infection.

EXAM:
CT ABDOMEN AND PELVIS WITHOUT CONTRAST
TECHNIQUE: Multidetector CT imaging of the abdomen and pelvis was performed
following the standard protocol without IV contrast.

[Series 2: axial st · axial · 0.81mm/px · z∈[-350,+30]mm · 11 of 86 slices shown, 13 images]
[im 5/86  soft-tissue]
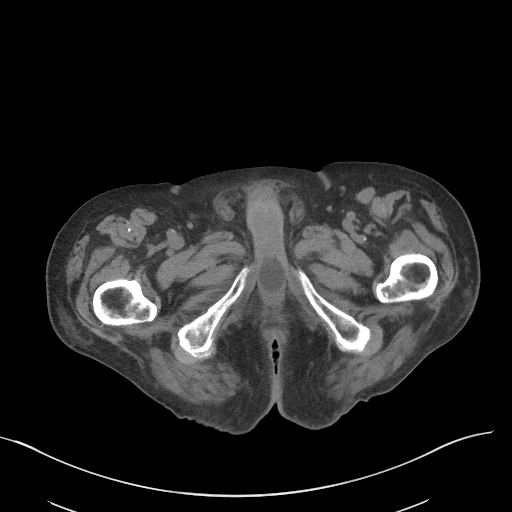
[im 5/86  bone]
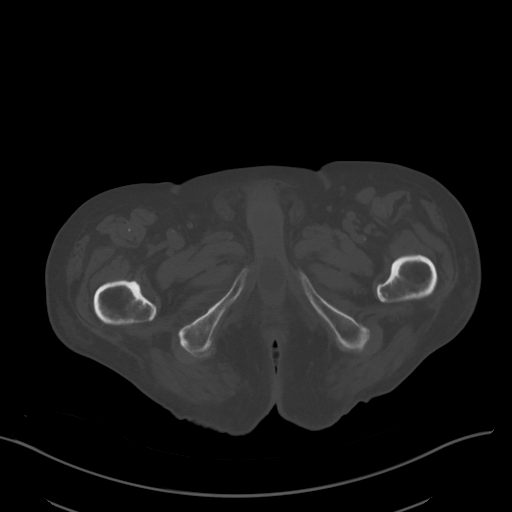
[im 13/86  soft-tissue]
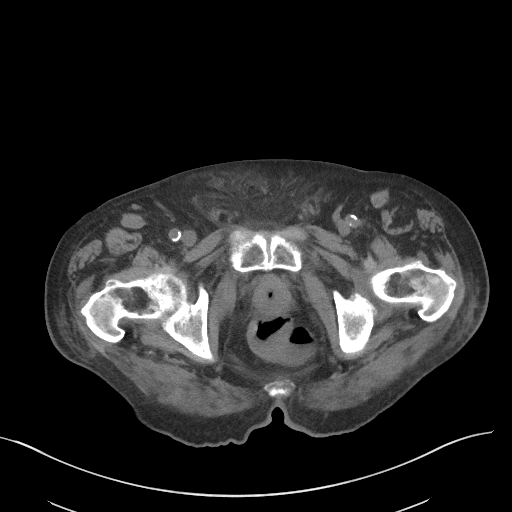
[im 22/86  soft-tissue]
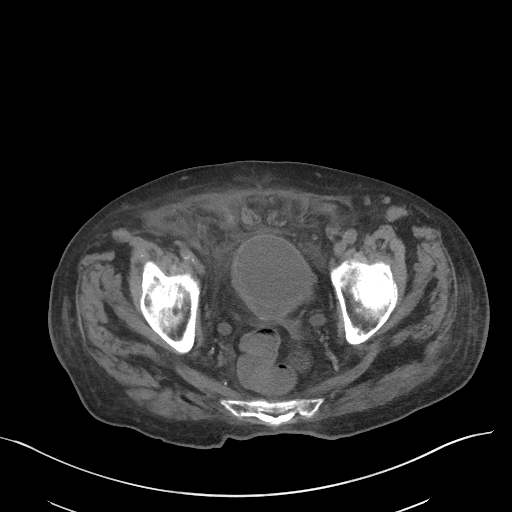
[im 30/86  soft-tissue]
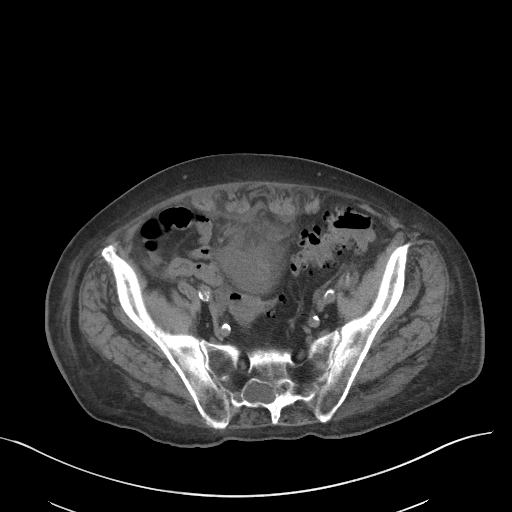
[im 35/86  soft-tissue]
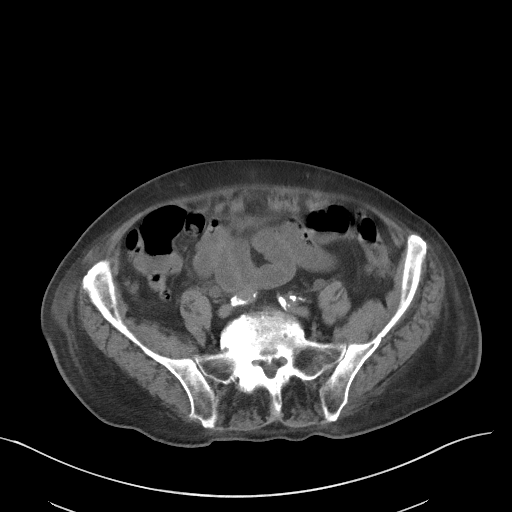
[im 43/86  soft-tissue]
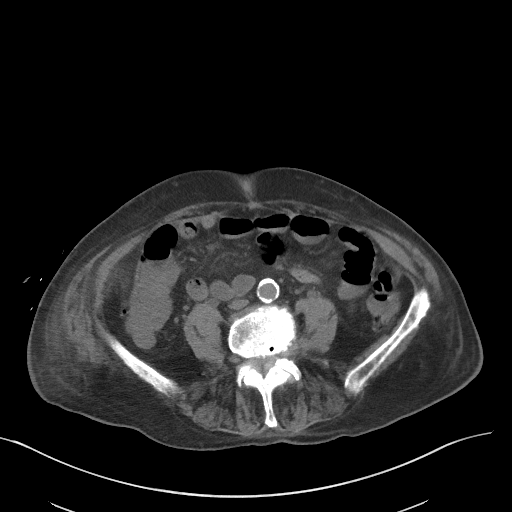
[im 52/86  soft-tissue]
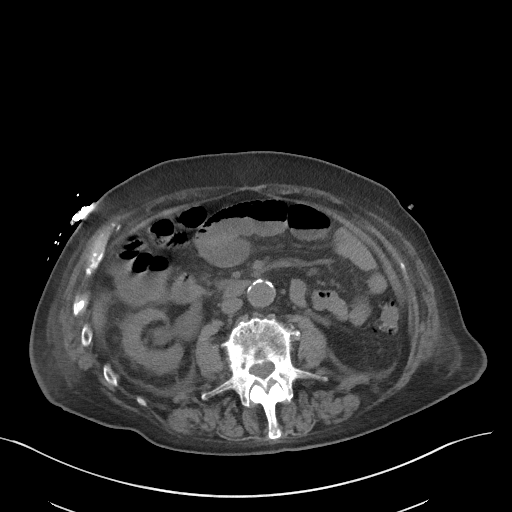
[im 56/86  soft-tissue]
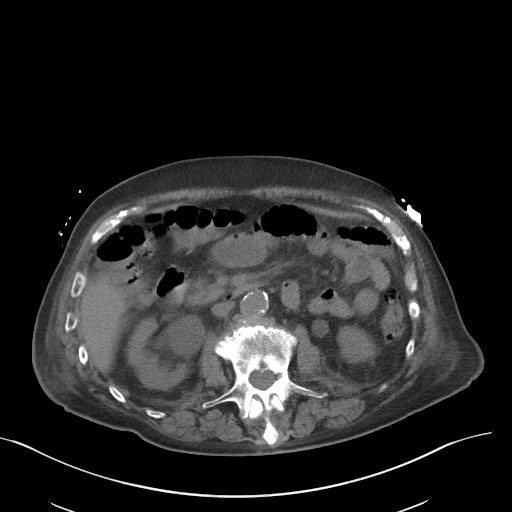
[im 64/86  soft-tissue]
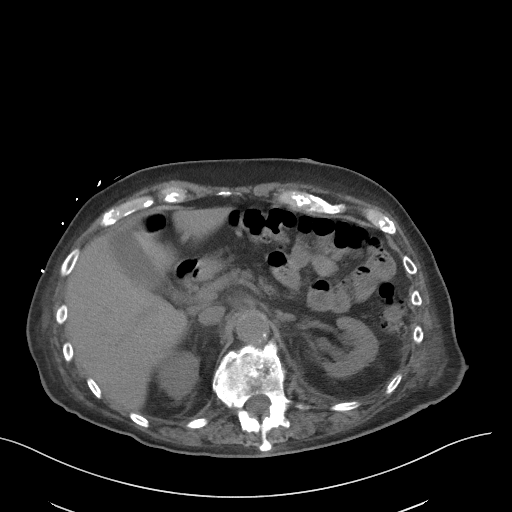
[im 64/86  bone]
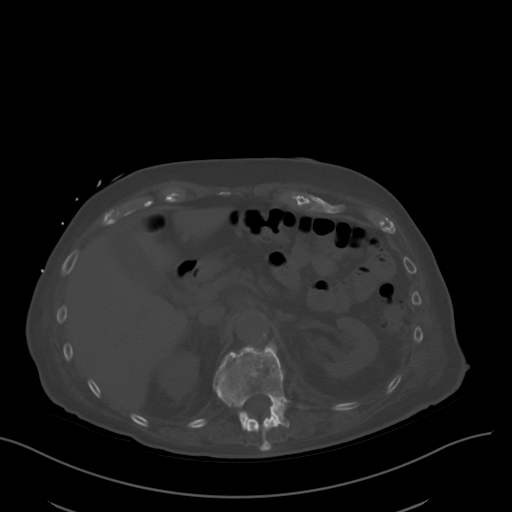
[im 73/86  soft-tissue]
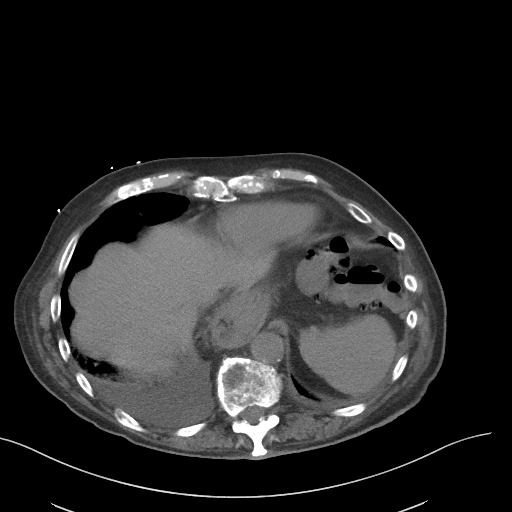
[im 81/86  soft-tissue]
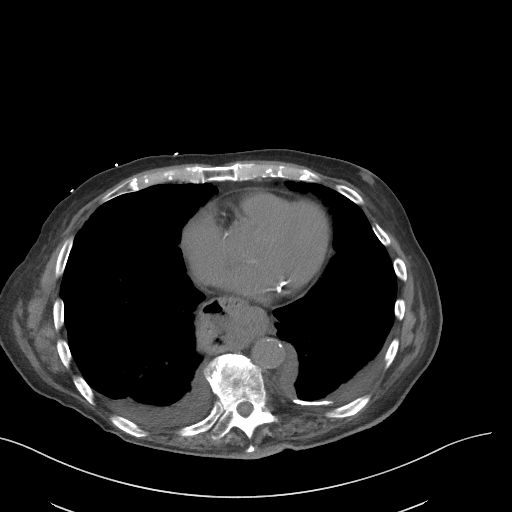

[Series 5: coronal · coronal · 0.72mm/px · 3 of 119 slices shown]
[im 40/119  soft-tissue]
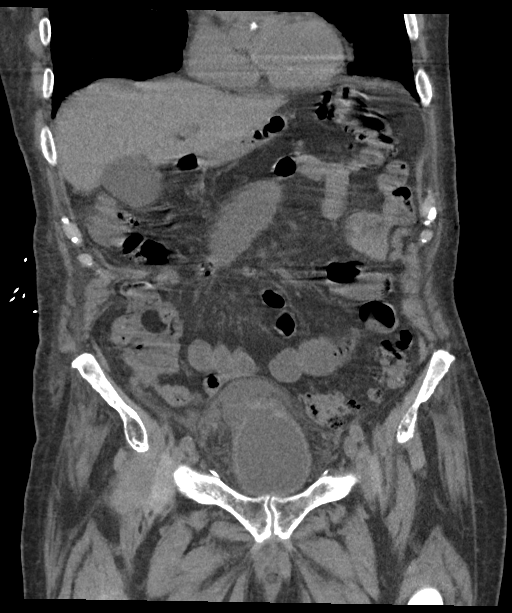
[im 53/119  soft-tissue]
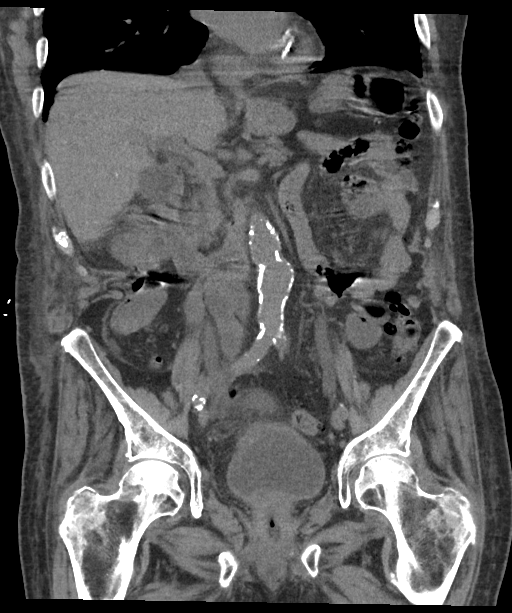
[im 66/119  soft-tissue]
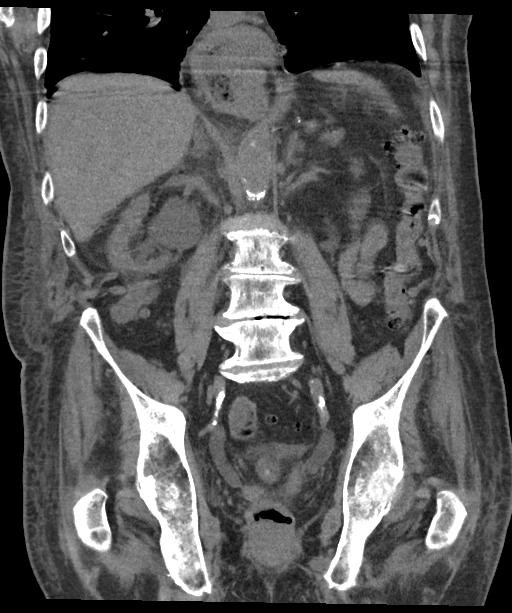

[14 of 46 positions shown; findings below may reference images not displayed]

FINDINGS: Motion degraded scan, limiting assessment.

Lower chest: Small dependent bilateral pleural effusions, right
greater than left. Stable calcified dependent left pleural plaque. A
few scattered tiny solid pulmonary nodules at the right lung base,
largest 3 mm in peripheral right lower lobe, all stable since
10/03/2014, probably benign. Mild dependent atelectasis at both lung
bases. Coronary atherosclerosis.

Hepatobiliary: Normal liver size. Stable tiny calcified inferior
right liver lobe granuloma. No liver masses. Normal gallbladder with
no radiopaque cholelithiasis. No biliary ductal dilatation.

Pancreas: Normal, with no mass or duct dilation.

Spleen: Normal size. No mass.

Adrenals/Urinary Tract: Normal adrenals. Nonobstructing punctate 2
mm lower left renal stone. No additional stones in the kidneys.
Chronic bilateral hydroureteronephrosis to the level of the
ureterovesical junctions, moderate on the right and mild on the
left, not appreciably changed compared to the 10/03/2014 CT study.
There are several (at least 5) tiny layering stones in the bladder,
largest 3 mm on the left, some of which are at or in close proximity
to the ureterovesical junctions bilaterally. No contour deforming
renal masses. Prominent diffuse bladder wall thickening and
trabeculation with several small bilateral bladder diverticula,
largest 1.6 cm in superior right bladder wall, not appreciably
changed. There is prominent perivesical fat stranding with
ill-defined perivesical fluid. Bladder is mildly distended.

Stomach/Bowel: Moderate hiatal hernia is stable. Otherwise normal
nondistended stomach. Normal caliber small bowel with no small bowel
wall thickening. Normal appendix. Marked colonic diverticulosis,
most prominent in the sigmoid colon, with no large bowel wall
thickening.

Vascular/Lymphatic: Atherosclerotic abdominal aorta with ectatic
cm infrarenal abdominal aorta, stable. No pathologically enlarged
lymph nodes in the abdomen or pelvis.

Reproductive: Malpositioned Foley catheter with the Foley balloon
dilated within the bulbar urethra (series 2/image 82) and the Foley
tip within the prostatic urethra. Normal size prostate.

Other: No pneumoperitoneum. Trace free fluid in the deep pelvis. No
measurable fluid collections.

Musculoskeletal: No aggressive appearing focal osseous lesions.
Chronic mild L1 vertebral compression fracture. Marked thoracolumbar
spondylosis. Chronic occult intrasacral meningocele measuring
cm, not appreciably changed.
IMPRESSION: 1. Malpositioned Foley catheter with the Foley balloon dilated
within the bulbar urethra.
2. Nonspecific prominent diffuse bladder wall thickening and
trabeculation with small bladder diverticula with perivesical fat
stranding and ill-defined fluid, worrisome for acute cystitis
superimposed on chronic bladder wall thickening due to chronic
bladder outlet obstruction. Several tiny layering bladder stones,
some located at or in close proximity to the ureterovesical
junctions bilaterally.
3. Chronic bilateral hydroureteronephrosis, moderate on the right
and mild on the left. Nonobstructing punctate left renal stone.
4. Small dependent bilateral pleural effusions, right greater than
left. Calcified pleural plaque on the left. Cannot exclude asbestos
related pleural disease.
5. Chronic findings include: Aortic Atherosclerosis ([id number]).
Stable ectatic 2.6 cm infrarenal abdominal aorta. Coronary
atherosclerosis. Marked colonic diverticulosis. Moderate hiatal
hernia.

These results were called by telephone at the time of interpretation
on 06/07/2017 at [DATE] to Dr. CHERE TL , who verbally acknowledged
these results.
